# Patient Record
Sex: Male | Born: 1935 | Race: Black or African American | Hispanic: No | Marital: Married | Smoking: Never smoker
Health system: Southern US, Community
[De-identification: ages and names within clinical notes are randomized; demographics above are authoritative.]

## PROBLEM LIST (undated history)

## (undated) DIAGNOSIS — R55 Syncope and collapse: Secondary | ICD-10-CM

## (undated) DIAGNOSIS — E7800 Pure hypercholesterolemia, unspecified: Secondary | ICD-10-CM

## (undated) DIAGNOSIS — N189 Chronic kidney disease, unspecified: Secondary | ICD-10-CM

## (undated) DIAGNOSIS — Z8619 Personal history of other infectious and parasitic diseases: Secondary | ICD-10-CM

## (undated) DIAGNOSIS — I10 Essential (primary) hypertension: Secondary | ICD-10-CM

## (undated) DIAGNOSIS — I2510 Atherosclerotic heart disease of native coronary artery without angina pectoris: Secondary | ICD-10-CM

## (undated) DIAGNOSIS — E785 Hyperlipidemia, unspecified: Secondary | ICD-10-CM

## (undated) DIAGNOSIS — I639 Cerebral infarction, unspecified: Secondary | ICD-10-CM

## (undated) DIAGNOSIS — Z8639 Personal history of other endocrine, nutritional and metabolic disease: Secondary | ICD-10-CM

## (undated) DIAGNOSIS — Z8719 Personal history of other diseases of the digestive system: Secondary | ICD-10-CM

## (undated) DIAGNOSIS — N289 Disorder of kidney and ureter, unspecified: Secondary | ICD-10-CM

## (undated) DIAGNOSIS — R0789 Other chest pain: Secondary | ICD-10-CM

## (undated) DIAGNOSIS — T7840XA Allergy, unspecified, initial encounter: Secondary | ICD-10-CM

## (undated) DIAGNOSIS — E870 Hyperosmolality and hypernatremia: Secondary | ICD-10-CM

## (undated) DIAGNOSIS — K219 Gastro-esophageal reflux disease without esophagitis: Secondary | ICD-10-CM

## (undated) HISTORY — DX: Hyperosmolality and hypernatremia: E87.0

## (undated) HISTORY — DX: Atherosclerotic heart disease of native coronary artery without angina pectoris: I25.10

## (undated) HISTORY — DX: Allergy, unspecified, initial encounter: T78.40XA

## (undated) HISTORY — DX: Cerebral infarction, unspecified: I63.9

## (undated) HISTORY — DX: Disorder of kidney and ureter, unspecified: N28.9

## (undated) HISTORY — DX: Essential (primary) hypertension: I10

## (undated) HISTORY — DX: Hyperlipidemia, unspecified: E78.5

## (undated) HISTORY — DX: Chronic kidney disease, unspecified: N18.9

## (undated) HISTORY — DX: Other chest pain: R07.89

## (undated) HISTORY — DX: Personal history of other infectious and parasitic diseases: Z86.19

## (undated) HISTORY — PX: TONSILLECTOMY: SHX5217

## (undated) HISTORY — DX: Syncope and collapse: R55

## (undated) HISTORY — DX: Pure hypercholesterolemia, unspecified: E78.00

---

## 1995-07-02 HISTORY — PX: CORONARY ARTERY BYPASS GRAFT: SHX141

## 1999-01-25 ENCOUNTER — Inpatient Hospital Stay (HOSPITAL_COMMUNITY): Admission: EM | Admit: 1999-01-25 | Discharge: 1999-01-28 | Payer: Self-pay | Admitting: Emergency Medicine

## 1999-01-25 ENCOUNTER — Encounter: Payer: Self-pay | Admitting: Cardiology

## 1999-01-26 HISTORY — PX: CARDIAC CATHETERIZATION: SHX172

## 1999-01-28 ENCOUNTER — Inpatient Hospital Stay (HOSPITAL_COMMUNITY): Admission: EM | Admit: 1999-01-28 | Discharge: 1999-01-30 | Payer: Self-pay | Admitting: Emergency Medicine

## 1999-01-28 ENCOUNTER — Encounter: Payer: Self-pay | Admitting: Cardiology

## 2002-06-17 ENCOUNTER — Encounter: Admission: RE | Admit: 2002-06-17 | Discharge: 2002-09-15 | Payer: Self-pay | Admitting: Cardiology

## 2006-03-07 ENCOUNTER — Encounter: Payer: Self-pay | Admitting: Cardiology

## 2006-11-14 ENCOUNTER — Emergency Department (HOSPITAL_COMMUNITY): Admission: EM | Admit: 2006-11-14 | Discharge: 2006-11-15 | Payer: Self-pay | Admitting: Emergency Medicine

## 2006-11-20 ENCOUNTER — Inpatient Hospital Stay (HOSPITAL_COMMUNITY): Admission: EM | Admit: 2006-11-20 | Discharge: 2006-11-23 | Payer: Self-pay | Admitting: Emergency Medicine

## 2010-03-09 ENCOUNTER — Ambulatory Visit: Payer: Self-pay | Admitting: Cardiology

## 2010-09-07 ENCOUNTER — Other Ambulatory Visit: Payer: Self-pay | Admitting: Cardiology

## 2010-09-07 ENCOUNTER — Ambulatory Visit (INDEPENDENT_AMBULATORY_CARE_PROVIDER_SITE_OTHER): Payer: Medicare Other | Admitting: Nurse Practitioner

## 2010-09-07 DIAGNOSIS — I2510 Atherosclerotic heart disease of native coronary artery without angina pectoris: Secondary | ICD-10-CM

## 2010-09-07 DIAGNOSIS — I119 Hypertensive heart disease without heart failure: Secondary | ICD-10-CM

## 2010-09-07 DIAGNOSIS — E119 Type 2 diabetes mellitus without complications: Secondary | ICD-10-CM

## 2010-09-07 LAB — COMPREHENSIVE METABOLIC PANEL
ALT: 19 U/L (ref 0–53)
AST: 19 U/L (ref 0–37)
Albumin: 4.7 g/dL (ref 3.5–5.2)
Alkaline Phosphatase: 100 U/L (ref 39–117)
BUN: 31 mg/dL — ABNORMAL HIGH (ref 6–23)
CO2: 20 mEq/L (ref 19–32)
Calcium: 9.2 mg/dL (ref 8.4–10.5)
Chloride: 108 mEq/L (ref 96–112)
Creat: 2.48 mg/dL — ABNORMAL HIGH (ref 0.40–1.50)
Glucose, Bld: 129 mg/dL — ABNORMAL HIGH (ref 70–99)
Potassium: 4 mEq/L (ref 3.5–5.3)
Sodium: 144 mEq/L (ref 135–145)
Total Bilirubin: 0.8 mg/dL (ref 0.3–1.2)
Total Protein: 7.4 g/dL (ref 6.0–8.3)

## 2010-09-07 LAB — LIPID PANEL
Cholesterol: 124 mg/dL (ref 0–200)
HDL: 25 mg/dL — ABNORMAL LOW (ref >39)
LDL Cholesterol: 53 mg/dL (ref 0–99)
Total CHOL/HDL Ratio: 5 Ratio
Triglycerides: 229 mg/dL — ABNORMAL HIGH (ref <150)
VLDL: 46 mg/dL — ABNORMAL HIGH (ref 0–40)

## 2010-09-07 LAB — HEMOGLOBIN A1C
Hgb A1c MFr Bld: 8.7 % — ABNORMAL HIGH (ref <5.7)
Mean Plasma Glucose: 203 mg/dL — ABNORMAL HIGH (ref <117)

## 2010-09-28 ENCOUNTER — Telehealth: Payer: Self-pay | Admitting: Cardiology

## 2010-09-28 NOTE — Telephone Encounter (Signed)
Advised of labs 

## 2010-09-28 NOTE — Telephone Encounter (Signed)
WANTS TO KNOW RESULTS OF LABS FROM LAST WEEK. RETURNING CALL FROM YESTERDAY

## 2010-11-02 ENCOUNTER — Other Ambulatory Visit: Payer: Self-pay | Admitting: *Deleted

## 2010-11-02 DIAGNOSIS — I10 Essential (primary) hypertension: Secondary | ICD-10-CM

## 2010-11-02 DIAGNOSIS — E7800 Pure hypercholesterolemia, unspecified: Secondary | ICD-10-CM

## 2010-11-02 MED ORDER — AMLODIPINE BESYLATE 10 MG PO TABS: 10.0000 mg | ORAL_TABLET | Freq: Every day | ORAL | Status: DC

## 2010-11-02 MED ORDER — ATORVASTATIN CALCIUM 40 MG PO TABS: 40.0000 mg | ORAL_TABLET | Freq: Every day | ORAL | Status: DC

## 2010-11-02 NOTE — Telephone Encounter (Signed)
Faxed refill for amlodipine 10mg 

## 2010-11-02 NOTE — Telephone Encounter (Signed)
Refilled atorvastatin 40 mg by fax to [facility] [address]

## 2010-11-12 ENCOUNTER — Other Ambulatory Visit: Payer: Self-pay | Admitting: *Deleted

## 2010-11-12 MED ORDER — ISOSORBIDE MONONITRATE ER 60 MG PO TB24: 60.0000 mg | ORAL_TABLET | ORAL | Status: DC

## 2010-11-12 NOTE — Telephone Encounter (Signed)
Refilled meds per fax request.  

## 2010-11-13 NOTE — H&P (Signed)
[name], [name] NO.:  [id number]   MEDICAL RECORD NO.:  [id number]          PATIENT TYPE:  INP   LOCATION:  4706                         FACILITY:  [facility]   PHYSICIAN:  Peter M. Swaziland, M.D.  DATE OF BIRTH:  12/24/1927   DATE OF ADMISSION:  11/20/2006  DATE OF DISCHARGE:                              HISTORY & PHYSICAL   HISTORY OF PRESENT ILLNESS:  [name] is a 77 year old black male who  has a history of coronary artery disease.  He is status post coronary  artery bypass surgery in 1997.  He also has history of diabetes mellitus  that has been poorly controlled.  His last A1c on 10/03/2006 was 10.4.  He was subsequently started on metformin.  The patient was well up until  this past Wednesday when he developed acute gastrointestinal illness  associated with nausea, vomiting, diarrhea and feeling that he was  febrile.  He was seen in the emergency department on Nov 14, 2006 and  treated and discharged to home.  He has continued to have persistent  nausea and vomiting.  He has had persistent loose stools with a severe  burning when he passes his bowels.  He has been unable to take p.o.'s  well and has been unable to take his medications due to his nausea and  vomiting.  He was seen at [facility] today and referred on to the  emergency department.  The patient is severely hyperglycemic on arrival,  with a glucose of greater than 700.  In addition, the patient has had a  three to four-day history of constant left anterior chest pain.  He just  describes this as a pain.  It does not radiate.  He has no associated  shortness of breath or diaphoresis.  He has not tried to take any  nitroglycerin.  Currently he states that pain has resolved.   PAST MEDICAL HISTORY:  1. Coronary disease status post CABG in 1997.  His last evaluation      with a stress Cardiolite study in September of 2007 showed poor      exercise tolerance.  He had mild anterior wall ischemia and  normal      ejection fraction of 64%.  2. Diabetes mellitus type 2, poorly controlled.  The patient is on      chronic insulin therapy.  3. Hypertension.  4. Hypercholesterolemia.  5. Status post tonsillectomy.  6. History of chronic renal insufficiency with baseline creatinine of      1.8.   ALLERGIES:  HE HAS NO KNOWN ALLERGIES.   PRIOR MEDICATIONS:  1. Metformin 500 mg per day.  2. Imdur 60 mg per day.  3. Toprol-XL 100 mg per day.  4. Ecotrin 325 mg per day.  5. Nexium 40 mg per day.  6. Lipitor 40 mg per day.  7. Amaryl 4 mg per day.  8. Norvasc 10 mg per day.  9. Lantus 50 units subcutaneously daily.  10.Cozaar 100 mg per day.  11.HCTZ 12.5 mg per day.   SOCIAL HISTORY:  The patient has no history  of smoking or tobacco.  He  is married.  He has five children.  He is retired.   FAMILY HISTORY:  Noncontributory.   REVIEW OF SYSTEMS:  Otherwise as noted in HPI.  Otherwise, negative.   PHYSICAL EXAMINATION:  GENERAL:  An elderly black male in no apparent  distress.  VITAL SIGNS:  Blood pressure 159/94, pulse 87 and regular.  He is  afebrile.  Saturations are 100%.  HEENT:  He is balding.  Normocephalic, atraumatic.  His pupils are  equal, round, and reactive.  Oropharynx is clear.  NECK:  Supple without JVD, adenopathy, thyromegaly, or bruits.  LUNGS:  Clear to auscultation and percussion.  CARDIAC:  Regular rate and rhythm without gallop, murmur, rub, or click.  ABDOMEN:  Soft, nontender.  Bowel sounds are positive.  There are no  masses.  EXTREMITIES:  Without edema.  Pulses were 2+ and symmetric.  NEUROLOGIC:  Intact.   LABORATORY DATA:  Chest x-ray shows no active disease.  ECG shows normal  sinus rhythm with nonspecific T-wave abnormality.  This is unchanged  from Nov 14, 2006.   Urinalysis shows a shows specific gravity of 1.027, pH of 5.5, and has  greater than 1000 glucose.  Microscopic is negative.  Myoglobin is  greater than 500.  CK-MB is 1.3.   Troponin is less than 0.05.  White  count 7800, hemoglobin 14.2, hematocrit 42.6, platelets 215,000.  Sodium  is 137, potassium 4.8, chloride 100, CO2 21, BUN 73, creatinine is 2.7,  glucose is 707.   IMPRESSION:  1. Diabetes mellitus with severe hyperglycemia related to dehydration,      recent gastrointestinal illness, and inability to take his      medications.  2. Severe dehydration  3. Atypical chest pain.  4. Status post remote coronary artery bypass surgery.  5. Hypertension.  6. Hyperlipidemia.  7. Acute-on-chronic renal insufficiency due to dehydration.   PLAN:  The patient will be admitted to telemetry.  Will rule out  myocardial infarction with serial cardiac enzymes.  He will be hydrated  aggressively with IV fluids and placed on Glucommander insulin.  [facility] hospitalists were consulted to help manage his hyperglycemia.  Will resume his cardiac medications except continue to hold his Cozaar,  HCTZ, and metformin.  Further plans per Dr. Patty Sermons.           ______________________________  Peter M. Swaziland, M.D.     PMJ/MEDQ  D:  11/20/2006  T:  11/21/2006  Job:  [id number]   cc:   Elliot Cousin, M.D.  Cassell Clement, M.D.

## 2010-11-13 NOTE — Consult Note (Signed)
NAMEBURECH, MCFARLAND NO.:  [id number]   MEDICAL RECORD NO.:  [id number]          PATIENT TYPE:  EMS   LOCATION:  MAJO                         FACILITY:  [facility]   PHYSICIAN:  Elliot Cousin, M.D.    DATE OF BIRTH:  September 19, 1927   DATE OF CONSULTATION:  11/20/2006  DATE OF DISCHARGE:                                 CONSULTATION   PRIMARY CARE PHYSICIAN:  Cassell Clement, M.D.   REASON FOR CONSULTATION:  Uncontrolled diabetes mellitus.   REFERRING PHYSICIAN:  Peter M. Swaziland, M.D.   HISTORY OF PRESENT ILLNESS:  The patient is a 77 year old man with a  past medical history significant for coronary artery disease, type 2  insulin-requiring diabetes mellitus, and hypertension, who presents to  the emergency department with a chief complaint of chest pain.  The  patient states that his chest pain started approximately one week ago.  It has been intermittent up until today.  Over the past 24 hours, it has  been mostly constant.  The pain is located primarily on the left side.  It is moderate in intensity.  It is dull.  It has been associated with  nausea and vomiting and feeling hot (question diaphoresis).  He has  shortness of breath also.  The pain occurred at rest.  He has also had a  recent bout of diarrhea last week.  He also had abdominal cramping and  subjective fever as well as a poor appetite.  Most of his symptoms have  resolved, however, he continues to have a poor appetite.  He admitted  not taking his diabetes medications for several days.  Last week, he  says that his blood sugars were ranging between 100 and 150.  Over the  past 5-6 days, he has not checked his blood sugars.   During the initial evaluation in the emergency department, per the  emergency department physician, it was found that his venous glucose was  greater than 700.  His EKG reveals T wave inversions in the lateral  leads and possibly ST depression in the lateral leads.  His QT  interval  is prolonged at 528 msec.  His BUN is elevated at 78 and his creatinine  is elevated at 2.6.  Of note, the patient actually presented to  [facility] this afternoon with abdominal pain and chest pain.  As the EMT  reported, his EKG was abnormal with T wave depressions.  He was given 4  baby aspirin and transferred to the Emergency Department at [facility].   PAST MEDICAL HISTORY:  1. Coronary artery disease, status post four-vessel CABG in 1997.  2. Type 2 diabetes mellitus, insulin-requiring. The patient was      diagnosed approximately 10 years ago.  He has no known history of      diabetic retinopathy, nephropathy, or neuropathy.  He denies      numbness and tingling in his feet and hands.  3. Hypertension.  4. Gastroesophageal reflux disease.  5. Hyperlipidemia.  6. Status post tonsillectomy in the past.  7. Recent gastroenteritis and generalized muscle aches.  MEDICATIONS:  1. Metformin 500 mg daily.  2. Lantus insulin 50 units q.h.s.  3. Glimepiride 4 mg, half tablet b.i.d.  4. Aspirin 325 mg daily.  5. Lipitor 40 mg daily.  6. Imdur 60 mg daily.  7. Nexium 40 mg daily.  8. Hydrochlorothiazide 12.5 mg daily.  9. Cozaar 100 mg daily.  10.Metoprolol ER 100 mg daily.  11.Amlodipine 10 mg daily.   ALLERGIES:  NO KNOWN DRUG ALLERGIES.   SOCIAL HISTORY:  The patient is married.  [place]  He has five children.  He is retired from [employer].  He denies tobacco, alcohol, and illicit drug use.  When he is  feeling well, he mows the lawn and is active in his garden.   FAMILY HISTORY:  His father died of a hemorrhage at 52 years of age.  His mother died of old age, she was in her late 65s.   REVIEW OF SYSTEMS:  As above in the history of present illness.   PHYSICAL EXAMINATION:  VITAL SIGNS:  Temperature 97, blood pressure  159/94, pulse 87, respiratory rate 18, oxygen saturation 100% on 2  liters of nasal  cannula oxygen.  GENERAL:  The patient is a pleasant 77 year old overweight African  American man who is currently lying in bed in no acute distress.  HEENT:  Head is normocephalic, nontraumatic.  Pupils are equal, round,  and reactive to light.  Extraocular movements are intact.  Conjunctivae  are clear.  Sclerae are white.  Nasal mucosa is mildly dry.  No sinus  tenderness.  Oropharynx reveals mildly dry mucous membranes.  No  posterior exudate or erythema.  No teeth are present.  NECK:  Supple.  No adenopathy.  No thyromegaly.  No bruit.  No JVD.  LUNGS:  Decreased breath sounds in the bases.  Breathing non-labored.  HEART:  S1 S2 with a soft systolic murmur.  CHEST WALL:  Well healed sternotomy scar, nontender.  ABDOMEN:  Obese, positive bowel sounds.  Soft, nontender, nondistended.  No hepatosplenomegaly.  No masses palpated.  EXTREMITIES:  Pedal pulses barely palpable bilaterally.  No pretibial  edema and no pedal edema.  Well healed left leg harvesting scar,  nontender.  NEUROLOGIC:  The patient is alert and oriented x3.  Cranial nerves II-  XII are intact with the exception of a decrease in hearing acuity.  Strength is grossly 5/5.  Sensation is intact.   ADMISSION LABORATORY:  EKG reveals nonspecific T wave abnormalities and  a prolonged QT interval of 528 milliseconds.  Chest x-ray reveals low  inspiratory lung volumes, otherwise no acute abnormalities.  CK-MB 1.3,  troponin I less than 0.05, myoglobin greater than 500.  Sodium 141,  potassium 4.8, chloride 109, BUN 78, glucose greater than 700,  bicarbonate 25, creatinine 2.6, glucose greater than 700.  WBC 7.8,  hemoglobin 14.2, platelets 215.   ASSESSMENT:  1. Uncontrolled type 2 diabetes mellitus.  The patient admits to      noncompliance and more than likely, this is the reason why his      venous glucose is greater than 700.  The patient is not in diabetic     ketoacidosis as his bicarbonate level is well within  normal limits.  2. Chest pain.  The patient has a history of coronary artery disease,      status post coronary artery bypass graft in the past.  His symptoms      are worrisome for  unstable angina.  He is currently chest pain      free, after being given 4 baby aspirin earlier in the afternoon.      He is treated with multiple cardiac medications including      metoprolol, Cozaar, Imdur, and Lipitor.  3. Resolving viral illness.  The patient probably had a      gastroenteritis.  He is currently afebrile and his white blood cell      count is within normal limits.  4. Acute renal failure.  More than likely the patient's renal failure      is acute in the setting of hyperglycemia.  It is unclear whether or      not the patient has underlying chronic kidney disease.  However,      with a venous glucose of greater than 700, the patient's acute      renal failure is probably prerenal azotemia.   RECOMMENDATIONS:  1. Volume repletion with normal saline with potassium chloride added.  2. We will start potassium chloride orally and in the IV fluids as the      serum potassium is expected to fall as his glycemic control      improves.  3. We will start the Glucomander insulin protocol.  4. Diabetes education and review.  5. We will check a TSH and hemoglobin A1c.  6. We will defer the cardiac issues to cardiology.  7. We will follow the patient's electrolytes and renal function      closely.      Elliot Cousin, M.D.  Electronically Signed     DF/MEDQ  D:  11/20/2006  T:  11/20/2006  Job:  [id number]   cc:   Cassell Clement, M.D.

## 2010-11-16 NOTE — Discharge Summary (Signed)
NAMEJERIMYAH, [name] NO.:  [id number]   MEDICAL RECORD NO.:  [id number]          PATIENT TYPE:  INP   LOCATION:  4706                         FACILITY:  [facility]   PHYSICIAN:  Michaelyn Barter, M.D. DATE OF BIRTH:  [birth year]   DATE OF ADMISSION:  11/20/2006  DATE OF DISCHARGE:  11/23/2006                               DISCHARGE SUMMARY   FINAL DIAGNOSES:  1. Severe hyperglycemia.  2. Dehydration.  3. Chest pain.  4. Renal insufficiency.  5. Hypernatremia.   CONSULTATIONS:  Cardiology with Dr. Cassell Clement.   HISTORY OF PRESENT ILLNESS:  [name] is a 77 year old gentleman.  He  indicated that a few days prior to this admission, he developed an acute  episode of nausea, vomiting and diarrhea, as well as subjective fevers.  He was seen in the ER on Nov 14, 2006, where he was treated and  discharged.  His symptoms persisted.  He indicated that he had had  persistent loose stools as well as burning when he passes his bowel  movements.  His p.o. intake had become poor.  He also indicated that he  had been able to take his medications due to the nausea and the  vomiting.  He was seen at [facility] on the date of admission and  referred to the hospital's ER for evaluation.   PAST MEDICAL HISTORY:  Please see that dictated by Dr. Peter Swaziland.   HOSPITAL COURSE:  PROBLEM #1 - SEVERE HYPERGLYCEMIA:  The patient's CBG  was found to be 700 after he presented to the hospital's ER.  He was  placed on a Glucommander.  His sugars improved over the course of his  hospitalization, although they did not completely normalized by the date  of discharge.  He was restarted on his p.o. medications as well as his  Lantus insulin.  The etiology of the patient's severe hyperglycemia most  likely was secondary to his inability to be compliant with his  medications secondary to the nausea, the vomiting and his overall  general state of feeling ill.  By the date of discharge, he  indicated,  however, that he felt significantly better.   PROBLEM #2 - DEHYDRATION:  This most likely was secondary to the  vomiting and diarrhea that the patient had experienced.  He was placed  on IV fluid hydration.  By the date of discharge, he indicated that he  felt significantly better and his dehydration had resolved.   PROBLEM #3 - CHEST PAIN:  The patient did not complain of any chest pain  during this hospitalization; however, it appears that prior to his  admission, he may have complained about chest pain.  His cardiac enzymes  were ordered; his CK-MB was found be 1.3 and 2.6 and 1.7.  His troponin  I was found to be 0.05, 0.06 and 0.06, respectively; therefore, he ruled  out for a cardiac event.  Cardiology was consulted.  Dr. Cassell Clement followed him initially, and later he was followed by Dr.  Donnie Aho.  The patient did not complain of any chest pain throughout  the  course of his hospitalization.   PROBLEM #4 - RENAL INSUFFICIENCY:  There may have been an acute  component to this.  Dehydration may have contributed.  When the patient  first arrived, his BUN was found to be 78, his creatinine 2.6.  By the  date of discharge, there was some improvement with a BUN of 33 and a  creatinine of 1.84.   PROBLEM #5 - HYPERNATREMIA:  Dehydration may have contributed to this.   By the date of discharge, the patient indicated that he felt  better.  His temperature was 98.9, heart rate 71, respirations 20, blood pressure  135/82.  O2 SAT was 95% on room air.  His CBGs were still slightly  elevated, although significantly down from the time of admission.  The  patient did request to be discharged from the hospital; therefore, I  discharged.   MEDICATIONS AT TIME OF DISCHARGE:  1. Amlodipine 10 mg p.o. daily.  2. Metoprolol ER one tablet daily.  3. Imdur 60 mg daily.  4. Nexium once a day.  5. Lipitor one tablet p.o. daily.  6. Lantus insulin 54 units nightly.  7.  Glimepiride take half a tablet b.i.d.  8. Aspirin 325 mg p.o. daily.  9. Cozaar 50 mg p.o. daily.   SPECIAL DISCHARGE INSTRUCTIONS:  Because of the renal insufficiency, the  patient was told to stop his Metformin, to take one-half of his 100 mg  Cozaar pill, to check his sugars at least twice a day and he was also  told not to take his hydrochlorothiazide until he sees his primary care  doctor.      Michaelyn Barter, M.D.  Electronically Signed     OR/MEDQ  D:  01/08/2007  T:  01/09/2007  Job:  [id number]

## 2011-01-31 ENCOUNTER — Encounter: Payer: Self-pay | Admitting: *Deleted

## 2011-02-01 ENCOUNTER — Encounter: Payer: Self-pay | Admitting: Cardiology

## 2011-02-01 ENCOUNTER — Encounter: Payer: Self-pay | Admitting: *Deleted

## 2011-02-04 ENCOUNTER — Ambulatory Visit (INDEPENDENT_AMBULATORY_CARE_PROVIDER_SITE_OTHER): Payer: Medicare Other | Admitting: *Deleted

## 2011-02-04 ENCOUNTER — Encounter: Payer: Self-pay | Admitting: Cardiology

## 2011-02-04 ENCOUNTER — Ambulatory Visit (INDEPENDENT_AMBULATORY_CARE_PROVIDER_SITE_OTHER): Payer: Medicare Other | Admitting: Cardiology

## 2011-02-04 VITALS — BP 140/80 | HR 67 | Wt 213.0 lb

## 2011-02-04 DIAGNOSIS — I259 Chronic ischemic heart disease, unspecified: Secondary | ICD-10-CM

## 2011-02-04 DIAGNOSIS — I519 Heart disease, unspecified: Secondary | ICD-10-CM

## 2011-02-04 DIAGNOSIS — Z794 Long term (current) use of insulin: Secondary | ICD-10-CM | POA: Insufficient documentation

## 2011-02-04 DIAGNOSIS — I119 Hypertensive heart disease without heart failure: Secondary | ICD-10-CM

## 2011-02-04 DIAGNOSIS — E119 Type 2 diabetes mellitus without complications: Secondary | ICD-10-CM

## 2011-02-04 DIAGNOSIS — E7800 Pure hypercholesterolemia, unspecified: Secondary | ICD-10-CM

## 2011-02-04 LAB — BASIC METABOLIC PANEL
BUN: 30 mg/dL — ABNORMAL HIGH (ref 6–23)
Creatinine, Ser: 2.4 mg/dL — ABNORMAL HIGH (ref 0.4–1.5)
GFR: 33.74 mL/min — ABNORMAL LOW (ref >60.00)
Glucose, Bld: 97 mg/dL (ref 70–99)
Potassium: 4.2 mEq/L (ref 3.5–5.1)

## 2011-02-04 LAB — LIPID PANEL
Cholesterol: 153 mg/dL (ref 0–200)
HDL: 32.2 mg/dL — ABNORMAL LOW (ref >39.00)
Triglycerides: 213 mg/dL — ABNORMAL HIGH (ref 0.0–149.0)
VLDL: 42.6 mg/dL — ABNORMAL HIGH (ref 0.0–40.0)

## 2011-02-04 LAB — HEPATIC FUNCTION PANEL
Albumin: 4.8 g/dL (ref 3.5–5.2)
Bilirubin, Direct: 0 mg/dL (ref 0.0–0.3)
Total Protein: 8.5 g/dL — ABNORMAL HIGH (ref 6.0–8.3)

## 2011-02-04 NOTE — Progress Notes (Signed)
[name] Date of Birth:  1927/12/27 [facility] / [facility] [address]   [address] [place], [place]  [address] [phone]           Fax   [phone]  History of Present Illness: This pleasant 77 year old gentleman has a history of ischemic heart disease as well as diabetes mellitus hypercholesterolemia and essential hypertension.  He has known coronary disease and had coronary artery bypass graft surgery in 1997.  His last nuclear stress test was in 2007.  He had mild reversible ischemia at that time and has been continued on medical therapy.  He denies any recent chest pain or angina.  He does remain relatively sedentary except for yard work.  He is a diabetic with occasional hypoglycemic episodes.  He had an echocardiogram 05/11/08 which showed an ejection fraction of 55-60%, elevated right ventricular systolic pressure of 44 and normal diastolic function mild aortic sclerosis mild mitral regurgitation and mild pulmonary hypertension.  He has not been experiencing any symptoms of congestive heart failure.  His last cardiac catheterization was in 2000 and did not require any intervention at that time  Current Outpatient Prescriptions  Medication Sig Dispense Refill  . amLODipine (NORVASC) 10 MG tablet Take 1 tablet (10 mg total) by mouth daily.  90 tablet  3  . aspirin 81 MG tablet Take 81 mg by mouth daily.        Marland Kitchen atorvastatin (LIPITOR) 40 MG tablet Take 1 tablet (40 mg total) by mouth daily.  90 tablet  3  . glimepiride (AMARYL) 4 MG tablet Take 4 mg by mouth 2 (two) times daily.        . insulin glargine (LANTUS) 100 UNIT/ML injection Inject into the skin. As directed      . isosorbide mononitrate (IMDUR) 60 MG 24 hr tablet Take 1 tablet (60 mg total) by mouth every morning.  30 tablet  11  . losartan-hydrochlorothiazide (HYZAAR) 100-12.5 MG per tablet Take 1 tablet by mouth daily.        . metoprolol (LOPRESSOR) 50 MG tablet Take 50 mg by mouth 2 (two) times daily.         Marland Kitchen omeprazole (PRILOSEC OTC) 20 MG tablet Take 20 mg by mouth daily.          No Known Allergies  There is no problem list on file for this patient.   History  Smoking status  . Never Smoker   Smokeless tobacco  . Never Used    History  Alcohol Use No    No family history on file.  Review of Systems: Constitutional: no fever chills diaphoresis or fatigue or change in weight.  Head and neck: no hearing loss, no epistaxis, no photophobia or visual disturbance. Respiratory: No cough, shortness of breath or wheezing. Cardiovascular: No chest pain peripheral edema, palpitations. Gastrointestinal: No abdominal distention, no abdominal pain, no change in bowel habits hematochezia or melena. Genitourinary: No dysuria, no frequency, no urgency, no nocturia. Musculoskeletal:No arthralgias, no back pain, no gait disturbance or myalgias. Neurological: No dizziness, no headaches, no numbness, no seizures, no syncope, no weakness, no tremors. Hematologic: No lymphadenopathy, no easy bruising. Psychiatric: No confusion, no hallucinations, no sleep disturbance.    Physical Exam: Filed Vitals:   02/04/11 0952  BP: 140/80  Pulse: 67  The general appearance reveals a well-developed well-nourished gentleman in no distress.Pupils equal and reactive.   Extraocular Movements are full.  There is no scleral icterus.  The mouth and pharynx are  normal.  The neck is supple.  The carotids reveal no bruits.  The jugular venous pressure is normal.  The thyroid is not enlarged.  There is no lymphadenopathy.  The chest is clear to percussion and auscultation. There are no rales or rhonchi. Expansion of the chest is symmetrical.  The precordium is quiet.  The first heart sound is normal.  The second heart sound is physiologically split.  There is no murmur gallop rub or click.  There is no abnormal lift or heave.  The abdomen is soft and nontender. Bowel sounds are normal. The liver and spleen are  not enlarged. There Are no abdominal masses. There are no bruits.  The pedal pulses are good.  There is no phlebitis or edema.  There is no cyanosis or clubbing.  Strength is normal and symmetrical in all extremities.  There is no lateralizing weakness.  There are no sensory deficits.  The skin is warm and dry.  There is no rash.  EKG today shows normal sinus rhythm and nonspecific T-wave   Assessment / Plan: Blood work today is pending.  Continue same medication.  Recheck in 4 months for followup office visit and lab work

## 2011-02-04 NOTE — Assessment & Plan Note (Signed)
The patient has not been experiencing any hypoglycemic reactions. 

## 2011-02-05 ENCOUNTER — Encounter: Payer: Self-pay | Admitting: Cardiology

## 2011-02-06 ENCOUNTER — Telehealth: Payer: Self-pay | Admitting: *Deleted

## 2011-02-06 NOTE — Telephone Encounter (Signed)
Message copied by Burnell Blanks on Wed Feb 06, 2011  9:15 AM ------      Message from: Cassell Clement      Created: Tue Feb 05, 2011  1:10 PM       Please report.  Cholesterol is better.  TG and HDL are better.LDL nl. Liver and kidnesys stable.  CSD.

## 2011-02-06 NOTE — Telephone Encounter (Signed)
Advised of lab results 

## 2011-02-22 ENCOUNTER — Other Ambulatory Visit: Payer: Self-pay | Admitting: Cardiology

## 2011-02-22 MED ORDER — METOPROLOL TARTRATE 50 MG PO TABS: 50.0000 mg | ORAL_TABLET | Freq: Two times a day (BID) | ORAL | Status: DC

## 2011-03-13 ENCOUNTER — Other Ambulatory Visit: Payer: Self-pay | Admitting: Cardiology

## 2011-03-13 ENCOUNTER — Other Ambulatory Visit: Payer: Self-pay | Admitting: *Deleted

## 2011-03-13 MED ORDER — INSULIN GLARGINE 100 UNIT/ML ~~LOC~~ SOLN: 48.0000 [IU] | Freq: Every day | SUBCUTANEOUS | Status: DC

## 2011-03-13 MED ORDER — "INSULIN SYRINGE-NEEDLE U-100 31G X 5/16"" 0.5 ML MISC": 1.0000 | Status: DC

## 2011-03-13 NOTE — Telephone Encounter (Signed)
Pt wife calling needing more syringes/needles called in to pharmacy. I asked pt wife to clarify and she was not able to. Please call with any questions.  Pt wife said Dr. Patty Sermons was pt only MD.

## 2011-03-13 NOTE — Telephone Encounter (Signed)
Refilled insulin syringes

## 2011-03-13 NOTE — Telephone Encounter (Signed)
Refilled lantus

## 2011-04-03 ENCOUNTER — Other Ambulatory Visit: Payer: Self-pay | Admitting: *Deleted

## 2011-04-03 MED ORDER — LOSARTAN POTASSIUM-HCTZ 100-12.5 MG PO TABS: 1.0000 | ORAL_TABLET | Freq: Every day | ORAL | Status: DC

## 2011-05-14 ENCOUNTER — Other Ambulatory Visit: Payer: Self-pay | Admitting: Cardiology

## 2011-05-14 MED ORDER — OMEPRAZOLE MAGNESIUM 20 MG PO TBEC: 20.0000 mg | DELAYED_RELEASE_TABLET | Freq: Every day | ORAL | Status: DC

## 2011-06-06 ENCOUNTER — Other Ambulatory Visit: Payer: Self-pay | Admitting: Cardiology

## 2011-06-06 ENCOUNTER — Other Ambulatory Visit: Payer: Medicare Other | Admitting: *Deleted

## 2011-06-06 ENCOUNTER — Encounter: Payer: Self-pay | Admitting: Cardiology

## 2011-06-06 ENCOUNTER — Ambulatory Visit (INDEPENDENT_AMBULATORY_CARE_PROVIDER_SITE_OTHER): Payer: Medicare Other | Admitting: Cardiology

## 2011-06-06 ENCOUNTER — Other Ambulatory Visit (INDEPENDENT_AMBULATORY_CARE_PROVIDER_SITE_OTHER): Payer: Medicare Other | Admitting: *Deleted

## 2011-06-06 VITALS — BP 136/88 | HR 88 | Ht 74.0 in | Wt 217.0 lb

## 2011-06-06 DIAGNOSIS — I119 Hypertensive heart disease without heart failure: Secondary | ICD-10-CM

## 2011-06-06 DIAGNOSIS — E119 Type 2 diabetes mellitus without complications: Secondary | ICD-10-CM

## 2011-06-06 DIAGNOSIS — I259 Chronic ischemic heart disease, unspecified: Secondary | ICD-10-CM

## 2011-06-06 DIAGNOSIS — E7800 Pure hypercholesterolemia, unspecified: Secondary | ICD-10-CM

## 2011-06-06 DIAGNOSIS — I519 Heart disease, unspecified: Secondary | ICD-10-CM

## 2011-06-06 LAB — HEPATIC FUNCTION PANEL
ALT: 24 U/L (ref 0–53)
AST: 25 U/L (ref 0–37)
Alkaline Phosphatase: 94 U/L (ref 39–117)
Total Bilirubin: 0.9 mg/dL (ref 0.3–1.2)

## 2011-06-06 LAB — BASIC METABOLIC PANEL
BUN: 31 mg/dL — ABNORMAL HIGH (ref 6–23)
CO2: 24 mEq/L (ref 19–32)
Calcium: 9.3 mg/dL (ref 8.4–10.5)
Creatinine, Ser: 2.5 mg/dL — ABNORMAL HIGH (ref 0.4–1.5)
GFR: 32.01 mL/min — ABNORMAL LOW (ref >60.00)
Glucose, Bld: 149 mg/dL — ABNORMAL HIGH (ref 70–99)
Sodium: 144 mEq/L (ref 135–145)

## 2011-06-06 LAB — HEMOGLOBIN A1C: Hgb A1c MFr Bld: 7.5 % — ABNORMAL HIGH (ref 4.6–6.5)

## 2011-06-06 LAB — LDL CHOLESTEROL, DIRECT: Direct LDL: 50.1 mg/dL

## 2011-06-06 LAB — LIPID PANEL: HDL: 31.8 mg/dL — ABNORMAL LOW (ref >39.00)

## 2011-06-06 NOTE — Patient Instructions (Signed)
Will obtain labs today and call you with the results  Your physician recommends that you continue on your current medications as directed. Please refer to the Current Medication list given to you today. Your physician wants you to follow-up in: 4 months  You will receive a reminder letter in the mail two months in advance. If you don't receive a letter, please call our office to schedule the follow-up appointment.  

## 2011-06-06 NOTE — Assessment & Plan Note (Signed)
Patient has not been experiencing any symptoms of congestive heart failure.  He denies any headaches or dizziness.  He is trying to avoid dietary salt.

## 2011-06-06 NOTE — Assessment & Plan Note (Addendum)
The patient has had no recurrent chest pain or angina 

## 2011-06-06 NOTE — Assessment & Plan Note (Signed)
The lipids today were drawn and are pending.

## 2011-06-06 NOTE — Progress Notes (Signed)
[name] Date of Birth:  05-28-1928 [facility] / [facility] [address]   [address] [place], [place]  [address] [phone]           Fax   [phone]  History of Present Illness: This pleasant 77 year old gentleman is seen for followup office visit.  He has a history of ischemic heart disease.  He has diabetes mellitus, hypercholesterolemia, and essential hypertension.  He is status post coronary artery bypass graft surgery in 1997.  His last nuclear stress test was in 2007 and at that time he did demonstrate mild reversible ischemia but was continued on medical therapy and has done well.  His echocardiogram in November 2009 showed an ejection fraction of 55-60% and mild pulmonary hypertension with right ventricular systolic pressure of 44 as well as normal diastolic function and mild aortic sclerosis and mild mitral regurgitation.  He had cardiac catheterization in 2000, 3 years after his bypass graft surgery, and did not require any intervention at that time.  Current Outpatient Prescriptions  Medication Sig Dispense Refill  . amLODipine (NORVASC) 10 MG tablet Take 1 tablet (10 mg total) by mouth daily.  90 tablet  3  . aspirin 81 MG tablet Take 81 mg by mouth daily.        Marland Kitchen atorvastatin (LIPITOR) 40 MG tablet Take 1 tablet (40 mg total) by mouth daily.  90 tablet  3  . glimepiride (AMARYL) 4 MG tablet Take 4 mg by mouth 2 (two) times daily.        . insulin glargine (LANTUS) 100 UNIT/ML injection Inject 52 Units into the skin daily. As directed       . Insulin Syringe-Needle U-100 31G X 5/16" 0.5 ML MISC 1 Syringe by Does not apply route as directed.  100 each  2  . isosorbide mononitrate (IMDUR) 60 MG 24 hr tablet Take 1 tablet (60 mg total) by mouth every morning.  30 tablet  11  . losartan-hydrochlorothiazide (HYZAAR) 100-12.5 MG per tablet Take 1 tablet by mouth daily.  30 tablet  7  . metoprolol (LOPRESSOR) 50 MG tablet Take 1 tablet (50 mg total) by mouth 2 (two)  times daily.  180 tablet  2  . omeprazole (PRILOSEC OTC) 20 MG tablet Take 1 tablet (20 mg total) by mouth daily.  90 tablet  3    No Known Allergies  Patient Active Problem List  Diagnoses  . Ischemic heart disease  . Benign hypertensive heart disease without heart failure  . Diabetes mellitus  . Hypercholesterolemia    History  Smoking status  . Never Smoker   Smokeless tobacco  . Never Used    History  Alcohol Use No    No family history on file.  Review of Systems: Constitutional: no fever chills diaphoresis or fatigue or change in weight.  Head and neck: no hearing loss, no epistaxis, no photophobia or visual disturbance. Respiratory: No cough, shortness of breath or wheezing. Cardiovascular: No chest pain peripheral edema, palpitations. Gastrointestinal: No abdominal distention, no abdominal pain, no change in bowel habits hematochezia or melena. Genitourinary: No dysuria, no frequency, no urgency, no nocturia. Musculoskeletal:No arthralgias, no back pain, no gait disturbance or myalgias. Neurological: No dizziness, no headaches, no numbness, no seizures, no syncope, no weakness, no tremors. Hematologic: No lymphadenopathy, no easy bruising. Psychiatric: No confusion, no hallucinations, no sleep disturbance.    Physical Exam: Filed Vitals:   06/06/11 0833  BP: 136/88  Pulse: 88   the general appearance  reveals a well-developed well-nourished elderly gentleman in no distress.The head and neck exam reveals pupils equal and reactive.  Extraocular movements are full.  There is no scleral icterus.  The mouth and pharynx are normal.  The neck is supple.  The carotids reveal no bruits.  The jugular venous pressure is normal.  The  thyroid is not enlarged.  There is no lymphadenopathy.  The chest is clear to percussion and auscultation.  There are no rales or rhonchi.  Expansion of the chest is symmetrical.  The precordium is quiet.  The first heart sound is normal.  The  second heart sound is physiologically split.  There is no murmur gallop rub or click.  There is no abnormal lift or heave.  The abdomen is soft and nontender.  The bowel sounds are normal.  The liver and spleen are not enlarged.  There are no abdominal masses.  There are no abdominal bruits.  Extremities reveal good pedal pulses.  There is no phlebitis or edema.  There is no cyanosis or clubbing.  Strength is normal and symmetrical in all extremities.  There is no lateralizing weakness.  There are no sensory deficits.  The skin is warm and dry.  There is no rash.     Assessment / Plan: Continue on same medication.  Lose weight.  Recheck in 4 months for followup office visit and fasting labs occluding hemoglobin A1c

## 2011-06-06 NOTE — Assessment & Plan Note (Signed)
The patient checks his blood sugar at home every day and his sugars at home have been normal.  He denies any hypoglycemic episodes.  His weight is up 4 pounds since last visit she attributes to the holidays

## 2011-06-11 ENCOUNTER — Telehealth: Payer: Self-pay | Admitting: *Deleted

## 2011-06-11 NOTE — Telephone Encounter (Signed)
Message copied by Burnell Blanks on Tue Jun 11, 2011  3:36 PM ------      Message from: Cassell Clement      Created: Mon Jun 10, 2011 10:34 AM       The hemoglobin A1c is improved at 7.5

## 2011-06-11 NOTE — Telephone Encounter (Signed)
Mailed copy of labs and left message to call if any questions  

## 2011-06-11 NOTE — Telephone Encounter (Signed)
Message copied by Burnell Blanks on Tue Jun 11, 2011  3:35 PM ------      Message from: Cassell Clement      Created: Mon Jun 10, 2011 10:33 AM       Please report.  The labs are stable.  Continue same meds.  Continue careful diet.  The kidney function is stable and the blood sugar is slightly higher at 149.  Watch diet carefully.

## 2011-06-11 NOTE — Telephone Encounter (Signed)
Message copied by Burnell Blanks on Tue Jun 11, 2011  3:36 PM ------      Message from: Cassell Clement      Created: Mon Jun 10, 2011 10:34 AM       The cholesterol and the LDL are satisfactory.

## 2011-07-12 ENCOUNTER — Emergency Department (HOSPITAL_COMMUNITY)
Admission: EM | Admit: 2011-07-12 | Discharge: 2011-07-13 | Disposition: A | Discharge status: Home / Self Care | Payer: Medicare Other | Attending: Emergency Medicine | Admitting: Emergency Medicine

## 2011-07-12 ENCOUNTER — Encounter (HOSPITAL_COMMUNITY): Payer: Self-pay | Admitting: Emergency Medicine

## 2011-07-12 ENCOUNTER — Emergency Department (HOSPITAL_COMMUNITY): Payer: Medicare Other

## 2011-07-12 ENCOUNTER — Telehealth: Payer: Self-pay | Admitting: Cardiology

## 2011-07-12 DIAGNOSIS — Z794 Long term (current) use of insulin: Secondary | ICD-10-CM | POA: Insufficient documentation

## 2011-07-12 DIAGNOSIS — Q618 Other cystic kidney diseases: Secondary | ICD-10-CM | POA: Insufficient documentation

## 2011-07-12 DIAGNOSIS — I10 Essential (primary) hypertension: Secondary | ICD-10-CM | POA: Insufficient documentation

## 2011-07-12 DIAGNOSIS — E119 Type 2 diabetes mellitus without complications: Secondary | ICD-10-CM | POA: Insufficient documentation

## 2011-07-12 DIAGNOSIS — M5137 Other intervertebral disc degeneration, lumbosacral region: Secondary | ICD-10-CM | POA: Insufficient documentation

## 2011-07-12 DIAGNOSIS — R109 Unspecified abdominal pain: Secondary | ICD-10-CM | POA: Insufficient documentation

## 2011-07-12 DIAGNOSIS — M51379 Other intervertebral disc degeneration, lumbosacral region without mention of lumbar back pain or lower extremity pain: Secondary | ICD-10-CM | POA: Insufficient documentation

## 2011-07-12 DIAGNOSIS — R10819 Abdominal tenderness, unspecified site: Secondary | ICD-10-CM | POA: Insufficient documentation

## 2011-07-12 DIAGNOSIS — J9819 Other pulmonary collapse: Secondary | ICD-10-CM | POA: Insufficient documentation

## 2011-07-12 DIAGNOSIS — I25810 Atherosclerosis of coronary artery bypass graft(s) without angina pectoris: Secondary | ICD-10-CM | POA: Insufficient documentation

## 2011-07-12 LAB — URINALYSIS, ROUTINE W REFLEX MICROSCOPIC
Ketones, ur: NEGATIVE mg/dL
Leukocytes, UA: NEGATIVE
Nitrite: NEGATIVE
Protein, ur: 30 mg/dL — AB
Urobilinogen, UA: 1 mg/dL (ref 0.0–1.0)

## 2011-07-12 LAB — DIFFERENTIAL
Basophils Absolute: 0 10*3/uL (ref 0.0–0.1)
Basophils Relative: 0 % (ref 0–1)
Monocytes Absolute: 0.9 10*3/uL (ref 0.1–1.0)
Neutro Abs: 7.2 10*3/uL (ref 1.7–7.7)
Neutrophils Relative %: 72 % (ref 43–77)

## 2011-07-12 LAB — COMPREHENSIVE METABOLIC PANEL
BUN: 41 mg/dL — ABNORMAL HIGH (ref 6–23)
CO2: 20 mEq/L (ref 19–32)
Chloride: 104 mEq/L (ref 96–112)
Creatinine, Ser: 2.44 mg/dL — ABNORMAL HIGH (ref 0.50–1.35)
GFR calc non Af Amer: 23 mL/min — ABNORMAL LOW (ref >90)
Total Bilirubin: 0.7 mg/dL (ref 0.3–1.2)

## 2011-07-12 LAB — CBC
MCHC: 33.6 g/dL (ref 30.0–36.0)
RDW: 13.1 % (ref 11.5–15.5)

## 2011-07-12 LAB — LIPASE, BLOOD: Lipase: 41 U/L (ref 11–59)

## 2011-07-12 MED ORDER — SODIUM CHLORIDE 0.9 % IV SOLN
Freq: Once | INTRAVENOUS | Status: AC
  Administered 2011-07-12: 125 mL/h via INTRAVENOUS

## 2011-07-12 MED ORDER — MORPHINE SULFATE 4 MG/ML IJ SOLN
4.0000 mg | Freq: Once | INTRAMUSCULAR | Status: AC
  Administered 2011-07-12: 4 mg via INTRAVENOUS
  Filled 2011-07-12: qty 1

## 2011-07-12 NOTE — ED Notes (Signed)
CT notified that pt was ready for scan 

## 2011-07-12 NOTE — ED Notes (Signed)
IV team at bedside 

## 2011-07-12 NOTE — Telephone Encounter (Signed)
Pt is having pains in the bottom of his stomach and wants to be seen asap. Per spouse she will be leaving at about 11:15 to get hair done and she will be back around 3pm

## 2011-07-12 NOTE — ED Notes (Signed)
RLQ pain x 3-4 days.  LBM 3 days ago.  Normally goes Q day.  Denies N/V/D

## 2011-07-12 NOTE — Telephone Encounter (Signed)
Per scheduling earlier, wife wanted to schedule appointment for Monday.  Was not able to call back before 11:15 and when I tried to call earlier, no answer.  Spoke with wife and patient.  Patient stated he was in a lot of pain, right sided.  Advised he should go to Urgent Care or Emergency Room.  Patient has been to UC on [address]  Advised he should go there.  Patient and wife verbalized understanding

## 2011-07-12 NOTE — ED Notes (Signed)
IV Team called. Unable to get IV access after 4 unsuccessful attempts.

## 2011-07-12 NOTE — ED Provider Notes (Signed)
History     CSN: [id number]  Arrival date & time 07/12/11  1735   First MD Initiated Contact with Patient 07/12/11 1840      Chief Complaint  Patient presents with  . Abdominal Pain    RLQ x 3-4 days.      (Consider location/radiation/quality/duration/timing/severity/associated sxs/prior treatment) HPI Comments: Patient presented complaining of right-sided abdominal pain for the last 3 days.  Patient states that he had some instant oatmeal a few days ago and since that time he's had gradually worsening pain in his right side.  He denies any nausea or vomiting.  He has been constipated for the last 3 days and he normally has daily bowel movements.  He denies any fevers.  He denies any prior abdominal surgeries.  He's not taking any medication at home for the pain.  He is also noted some associated decreased appetite and decreased by mouth intake due to his decreased appetite.  Patient is a 78 y.o. male presenting with abdominal pain. The history is provided by the patient. No language interpreter was used.  Abdominal Pain The primary symptoms of the illness include abdominal pain. The primary symptoms of the illness do not include fever, fatigue, shortness of breath, nausea, vomiting, diarrhea, hematemesis, hematochezia or dysuria. The current episode started more than 2 days ago. The onset of the illness was gradual. The problem has been gradually worsening.  Additional symptoms associated with the illness include constipation. Symptoms associated with the illness do not include chills, hematuria or back pain.    Past Medical History  Diagnosis Date  . Coronary artery disease     post CABG in 1997  . Diabetes mellitus     type 2  . Hypertension   . Hyperlipidemia   . Hypercholesterolemia   . Atypical chest pain   . Acute on chronic renal insufficiency   . Hypernatremia     Past Surgical History  Procedure Date  . Coronary artery bypass graft 1997    x3 -- patent internal  mammary graft to saphenous vein grafts x3  . Tonsillectomy   . Cardiac catheterization 01/26/1999    patent internal mammary graft to saphenous vein grafts x3 -- singnificant three-vessel coroanry artery disease -- potential sites of ischemia involving the intermediate branch, circumflex branch and the distal LAD through diffuse disease, as well as steal type syndrome -- normal LV function -- W. Ashley Royalty., M.D.      No family history on file.  History  Substance Use Topics  . Smoking status: Never Smoker   . Smokeless tobacco: Never Used  . Alcohol Use: No      Review of Systems  Constitutional: Negative.  Negative for fever, chills and fatigue.  HENT: Negative.   Eyes: Negative.  Negative for discharge and redness.  Respiratory: Negative.  Negative for cough and shortness of breath.   Cardiovascular: Negative.  Negative for chest pain.  Gastrointestinal: Positive for abdominal pain and constipation. Negative for nausea, vomiting, diarrhea, hematochezia and hematemesis.  Genitourinary: Negative.  Negative for dysuria and hematuria.  Musculoskeletal: Negative.  Negative for back pain.  Skin: Negative.  Negative for color change and rash.  Neurological: Negative for syncope and headaches.  Hematological: Negative.  Negative for adenopathy.  Psychiatric/Behavioral: Negative.  Negative for confusion.  All other systems reviewed and are negative.    Allergies  Review of patient's allergies indicates no known allergies.  Home Medications   Current Outpatient Rx  Name Route Sig Dispense Refill  .  AMLODIPINE BESYLATE 10 MG PO TABS Oral Take 1 tablet (10 mg total) by mouth daily. 90 tablet 3  . ASPIRIN 81 MG PO TABS Oral Take 81 mg by mouth daily.      . ATORVASTATIN CALCIUM 40 MG PO TABS Oral Take 1 tablet (40 mg total) by mouth daily. 90 tablet 3  . GLIMEPIRIDE 4 MG PO TABS Oral Take 4 mg by mouth 2 (two) times daily.      . INSULIN GLARGINE 100 UNIT/ML [place] SOLN  Subcutaneous Inject 52 Units into the skin daily. As directed     . ISOSORBIDE MONONITRATE ER 60 MG PO TB24 Oral Take 1 tablet (60 mg total) by mouth every morning. 30 tablet 11  . LOSARTAN POTASSIUM-HCTZ 100-12.5 MG PO TABS Oral Take 1 tablet by mouth daily. 30 tablet 7  . METOPROLOL TARTRATE 50 MG PO TABS Oral Take 1 tablet (50 mg total) by mouth 2 (two) times daily. 180 tablet 2  . OMEPRAZOLE MAGNESIUM 20 MG PO TBEC Oral Take 1 tablet (20 mg total) by mouth daily. 90 tablet 3  . INSULIN SYRINGE-NEEDLE U-100 31G X 5/16" 0.5 ML MISC Does not apply 1 Syringe by Does not apply route as directed. 100 each 2    BP 184/77  Pulse 77  Temp(Src) 98.3 F (36.8 C) (Oral)  Resp 20  SpO2 94%  Physical Exam  Nursing note and vitals reviewed. Constitutional: He is oriented to person, place, and time. He appears well-developed and well-nourished.  Non-toxic appearance. He does not have a sickly appearance.  HENT:  Head: Normocephalic and atraumatic.  Eyes: Conjunctivae, EOM and lids are normal. Pupils are equal, round, and reactive to light.  Neck: Trachea normal, normal range of motion and full passive range of motion without pain. Neck supple.  Cardiovascular: Normal rate, regular rhythm and normal heart sounds.   Pulmonary/Chest: Effort normal and breath sounds normal. No respiratory distress.  Abdominal: Soft. Normal appearance. He exhibits no distension. There is tenderness. There is no rebound and no CVA tenderness.       Right upper quadrant and right lower quadrant mild to moderate tenderness on examination.  No rebound or guarding.  Musculoskeletal: Normal range of motion.  Neurological: He is alert and oriented to person, place, and time. He has normal strength.  Skin: Skin is warm, dry and intact. No rash noted.  Psychiatric: He has a normal mood and affect. His behavior is normal. Judgment and thought content normal.    ED Course  Procedures (including critical care time)  Results  for orders placed during the hospital encounter of 07/12/11  COMPREHENSIVE METABOLIC PANEL      Component Value Range   Sodium 140  135 - 145 (mEq/L)   Potassium 4.1  3.5 - 5.1 (mEq/L)   Chloride 104  96 - 112 (mEq/L)   CO2 20  19 - 32 (mEq/L)   Glucose, Bld 175 (*) 70 - 99 (mg/dL)   BUN 41 (*) 6 - 23 (mg/dL)   Creatinine, Ser 1.91 (*) 0.50 - 1.35 (mg/dL)   Calcium 9.3  8.4 - 47.8 (mg/dL)   Total Protein 7.9  6.0 - 8.3 (g/dL)   Albumin 4.0  3.5 - 5.2 (g/dL)   AST 16  0 - 37 (U/L)   ALT 18  0 - 53 (U/L)   Alkaline Phosphatase 98  39 - 117 (U/L)   Total Bilirubin 0.7  0.3 - 1.2 (mg/dL)   GFR calc non Af Amer 23 (*) >  90 (mL/min)   GFR calc Af Amer 27 (*) >90 (mL/min)  LIPASE, BLOOD      Component Value Range   Lipase 41  11 - 59 (U/L)  URINALYSIS, ROUTINE W REFLEX MICROSCOPIC      Component Value Range   Color, Urine YELLOW  YELLOW    APPearance CLEAR  CLEAR    Specific Gravity, Urine 1.017  1.005 - 1.030    pH 5.0  5.0 - 8.0    Glucose, UA 100 (*) NEGATIVE (mg/dL)   Hgb urine dipstick SMALL (*) NEGATIVE    Bilirubin Urine NEGATIVE  NEGATIVE    Ketones, ur NEGATIVE  NEGATIVE (mg/dL)   Protein, ur 30 (*) NEGATIVE (mg/dL)   Urobilinogen, UA 1.0  0.0 - 1.0 (mg/dL)   Nitrite NEGATIVE  NEGATIVE    Leukocytes, UA NEGATIVE  NEGATIVE   CBC      Component Value Range   WBC 9.9  4.0 - 10.5 (K/uL)   RBC 3.90 (*) 4.22 - 5.81 (MIL/uL)   Hemoglobin 11.9 (*) 13.0 - 17.0 (g/dL)   HCT 16.1 (*) 09.6 - 52.0 (%)   MCV 90.8  78.0 - 100.0 (fL)   MCH 30.5  26.0 - 34.0 (pg)   MCHC 33.6  30.0 - 36.0 (g/dL)   RDW 04.5  40.9 - 81.1 (%)   Platelets 182  150 - 400 (K/uL)  DIFFERENTIAL      Component Value Range   Neutrophils Relative 72  43 - 77 (%)   Neutro Abs 7.2  1.7 - 7.7 (K/uL)   Lymphocytes Relative 18  12 - 46 (%)   Lymphs Abs 1.8  0.7 - 4.0 (K/uL)   Monocytes Relative 9  3 - 12 (%)   Monocytes Absolute 0.9  0.1 - 1.0 (K/uL)   Eosinophils Relative 1  0 - 5 (%)   Eosinophils Absolute  0.1  0.0 - 0.7 (K/uL)   Basophils Relative 0  0 - 1 (%)   Basophils Absolute 0.0  0.0 - 0.1 (K/uL)  URINE MICROSCOPIC-ADD ON      Component Value Range   Squamous Epithelial / LPF RARE  RARE    Dg Abd Acute W/chest  07/12/2011  *RADIOLOGY REPORT*  Clinical Data: Right side abdominal pain, decreased bowel movements  ACUTE ABDOMEN SERIES (ABDOMEN 2 VIEW & CHEST 1 VIEW)  Comparison: Chest radiograph 11/20/1998 a  Findings: Upper normal heart size post CABG. Tortuous aorta. Pulmonary vascularity normal. Bibasilar atelectasis. Upper lungs clear. Nonobstructive bowel gas pattern. No bowel dilatation, bowel wall thickening or free intraperitoneal air. No urinary tract calcification. Gas and stool present in rectum. Bones mildly demineralized.  IMPRESSION: No acute abdominal abnormalities. Bibasilar atelectasis.  Original Report Authenticated By: Lollie Marrow, M.D.       MDM  Patient with right-sided abdominal pain that is somewhat poorly localized.  He doesn't have elevations in his LFTs or lipase to suggest cholecystitis, hepatitis or pancreatitis.  There is some possibility for appendicitis given the patient's decreased appetite and has right-sided abdominal pain.  There no acute findings for obstruction on his acute abdominal series and the abdominal exam is not concerning for perforation.  No significant constipation seen on the x-ray.  Patient is currently pending a CT scan to evaluate for further intra-abdominal pathology at this time.        Nat Christen, MD 07/13/11 [phone]

## 2011-07-13 MED ORDER — TRAMADOL HCL 50 MG PO TABS: 50.0000 mg | ORAL_TABLET | Freq: Four times a day (QID) | ORAL | Status: AC | PRN

## 2011-10-14 ENCOUNTER — Other Ambulatory Visit: Payer: Self-pay | Admitting: *Deleted

## 2011-10-14 MED ORDER — GLIMEPIRIDE 4 MG PO TABS: 4.0000 mg | ORAL_TABLET | Freq: Two times a day (BID) | ORAL | Status: DC

## 2011-10-14 NOTE — Telephone Encounter (Signed)
Refilled glimepiride.

## 2011-10-24 ENCOUNTER — Other Ambulatory Visit (INDEPENDENT_AMBULATORY_CARE_PROVIDER_SITE_OTHER): Payer: Medicare Other

## 2011-10-24 ENCOUNTER — Encounter: Payer: Self-pay | Admitting: Cardiology

## 2011-10-24 ENCOUNTER — Ambulatory Visit (INDEPENDENT_AMBULATORY_CARE_PROVIDER_SITE_OTHER): Payer: Medicare Other | Admitting: Cardiology

## 2011-10-24 VITALS — BP 130/88 | HR 80 | Ht 73.0 in | Wt 211.0 lb

## 2011-10-24 DIAGNOSIS — I119 Hypertensive heart disease without heart failure: Secondary | ICD-10-CM

## 2011-10-24 DIAGNOSIS — I2510 Atherosclerotic heart disease of native coronary artery without angina pectoris: Secondary | ICD-10-CM

## 2011-10-24 DIAGNOSIS — I259 Chronic ischemic heart disease, unspecified: Secondary | ICD-10-CM

## 2011-10-24 DIAGNOSIS — E7800 Pure hypercholesterolemia, unspecified: Secondary | ICD-10-CM

## 2011-10-24 DIAGNOSIS — E119 Type 2 diabetes mellitus without complications: Secondary | ICD-10-CM

## 2011-10-24 LAB — LIPID PANEL
Cholesterol: 134 mg/dL (ref 0–200)
Total CHOL/HDL Ratio: 4
Triglycerides: 172 mg/dL — ABNORMAL HIGH (ref 0.0–149.0)

## 2011-10-24 LAB — HEPATIC FUNCTION PANEL
AST: 23 U/L (ref 0–37)
Albumin: 4 g/dL (ref 3.5–5.2)
Alkaline Phosphatase: 96 U/L (ref 39–117)

## 2011-10-24 LAB — BASIC METABOLIC PANEL
BUN: 24 mg/dL — ABNORMAL HIGH (ref 6–23)
CO2: 22 mEq/L (ref 19–32)
Calcium: 9.1 mg/dL (ref 8.4–10.5)
Creatinine, Ser: 2.3 mg/dL — ABNORMAL HIGH (ref 0.4–1.5)

## 2011-10-24 MED ORDER — LOSARTAN POTASSIUM 50 MG PO TABS: 50.0000 mg | ORAL_TABLET | Freq: Every day | ORAL | Status: DC

## 2011-10-24 NOTE — Assessment & Plan Note (Signed)
No hypoglycemic episodes.  Blood sugars recorded at home have been satisfactory

## 2011-10-24 NOTE — Assessment & Plan Note (Signed)
The patient has had no recurrent chest pain or angina 

## 2011-10-24 NOTE — Patient Instructions (Signed)
WILL STOP LOSARTAN HCT AND START PLAIN LOSARTAN 50 MG DAILY  Your physician recommends that you schedule a follow-up appointment in: 4 months with fasting labs (lp/bmet/hfp/a1c/ekg)

## 2011-10-24 NOTE — Progress Notes (Signed)
[name] Date of Birth:  14-Feb-1928 [facility] [address] [place], [place]  [address] [phone]         Fax   [phone]  History of Present Illness: This pleasant 78 year old African American gentleman is seen for a four-month followup office visit.  He has a past history of ischemic heart disease and a history of diabetes and hypertension and hypercholesterolemia.  Since last visit has been doing well.  He had coronary artery bypass graft surgery in 1997.  Last nuclear stress test was in 2007 and at that time he did  demonstrate mild reversible ischemia but has continued to do well on medical therapy.  An echocardiogram in November 2009 showed an ejection fraction of 55-60% he had cardiac catheterization in 2000 which was 3 years after his bypass graft surgery and he did not require any intervention at that time.  Current Outpatient Prescriptions  Medication Sig Dispense Refill  . amLODipine (NORVASC) 10 MG tablet Take 1 tablet (10 mg total) by mouth daily.  90 tablet  3  . aspirin 81 MG tablet Take 81 mg by mouth daily.        Marland Kitchen atorvastatin (LIPITOR) 40 MG tablet Take 1 tablet (40 mg total) by mouth daily.  90 tablet  3  . glimepiride (AMARYL) 4 MG tablet Take 1 tablet (4 mg total) by mouth 2 (two) times daily.  180 tablet  3  . insulin glargine (LANTUS) 100 UNIT/ML injection Inject 52 Units into the skin daily. As directed       . Insulin Syringe-Needle U-100 31G X 5/16" 0.5 ML MISC 1 Syringe by Does not apply route as directed.  100 each  2  . isosorbide mononitrate (IMDUR) 60 MG 24 hr tablet Take 1 tablet (60 mg total) by mouth every morning.  30 tablet  11  . metoprolol (LOPRESSOR) 50 MG tablet Take 1 tablet (50 mg total) by mouth 2 (two) times daily.  180 tablet  2  . omeprazole (PRILOSEC OTC) 20 MG tablet Take 1 tablet (20 mg total) by mouth daily.  90 tablet  3  . losartan (COZAAR) 50 MG tablet Take 1 tablet (50 mg total) by mouth daily.  90 tablet  3     No Known Allergies  Patient Active Problem List  Diagnoses  . Ischemic heart disease  . Benign hypertensive heart disease without heart failure  . Diabetes mellitus  . Hypercholesterolemia    History  Smoking status  . Never Smoker   Smokeless tobacco  . Never Used    History  Alcohol Use No    No family history on file.  Review of Systems: Constitutional: no fever chills diaphoresis or fatigue or change in weight.  Head and neck: no hearing loss, no epistaxis, no photophobia or visual disturbance. Respiratory: No cough, shortness of breath or wheezing. Cardiovascular: No chest pain peripheral edema, palpitations. Gastrointestinal: No abdominal distention, no abdominal pain, no change in bowel habits hematochezia or melena. Genitourinary: No dysuria, no frequency, no urgency, no nocturia. Musculoskeletal:No arthralgias, no back pain, no gait disturbance or myalgias. Neurological: No dizziness, no headaches, no numbness, no seizures, no syncope, no weakness, no tremors. Hematologic: No lymphadenopathy, no easy bruising. Psychiatric: No confusion, no hallucinations, no sleep disturbance.    Physical Exam: Filed Vitals:   10/24/11 1046  BP: 130/88  Pulse: 80   the general appearance reveals a well-developed well-nourished gentleman in no distress.  His weight is down 6 pounds since  his last visit.The head and neck exam reveals pupils equal and reactive.  Extraocular movements are full.  There is no scleral icterus.  The mouth and pharynx are normal.  The neck is supple.  The carotids reveal no bruits.  The jugular venous pressure is normal.  The  thyroid is not enlarged.  There is no lymphadenopathy.  The chest is clear to percussion and auscultation.  There are no rales or rhonchi.  Expansion of the chest is symmetrical.  The precordium is quiet.  The first heart sound is normal.  The second heart sound is physiologically split.  There is no murmur gallop rub or click.   There is no abnormal lift or heave.  The abdomen is soft and nontender.  The bowel sounds are normal.  The liver and spleen are not enlarged.  There are no abdominal masses.  There are no abdominal bruits.  Extremities reveal good pedal pulses.  There is no phlebitis or edema.  There is no cyanosis or clubbing.  Strength is normal and symmetrical in all extremities.  There is no lateralizing weakness.  There are no sensory deficits.  The skin is warm and dry.  There is no rash.     Assessment / Plan: Continue same medication.  His blood pressures have been borderline here and we are going to restart losartan 50 mg daily without the hydrochlorothiazide.  Recheck in 4 months for followup office visit EKG lipid panel hepatic function panel nasal metabolic panel and hemoglobin Y7W

## 2011-10-24 NOTE — Assessment & Plan Note (Signed)
The patient brought in a list of his blood pressures and of his blood sugars.  His blood pressure has been satisfactory and he has been off his losartan HCT for several months.

## 2011-10-24 NOTE — Progress Notes (Signed)
Quick Note:  Please report to patient. The recent labs are stable. Continue same medication and careful diet. ______ 

## 2011-10-25 ENCOUNTER — Telehealth: Payer: Self-pay | Admitting: *Deleted

## 2011-10-25 NOTE — Telephone Encounter (Signed)
Message copied by Burnell Blanks on Fri Oct 25, 2011 10:29 AM ------      Message from: Cassell Clement      Created: Thu Oct 24, 2011  9:10 PM       Please report to patient.  The recent labs are stable. Continue same medication and careful diet.

## 2011-10-25 NOTE — Telephone Encounter (Signed)
Advised of results

## 2011-11-13 ENCOUNTER — Other Ambulatory Visit: Payer: Self-pay | Admitting: Cardiology

## 2011-11-13 NOTE — Telephone Encounter (Signed)
Refilled atorvastatin,amlodipine and isosorbide

## 2011-12-05 ENCOUNTER — Encounter (HOSPITAL_COMMUNITY): Payer: Self-pay | Admitting: *Deleted

## 2011-12-05 ENCOUNTER — Inpatient Hospital Stay (HOSPITAL_COMMUNITY)
Admission: EM | Admit: 2011-12-05 | Discharge: 2011-12-07 | DRG: 312 | Disposition: A | Discharge status: Home / Self Care | Payer: Medicare Other | Attending: Internal Medicine | Admitting: Internal Medicine

## 2011-12-05 DIAGNOSIS — E7800 Pure hypercholesterolemia, unspecified: Secondary | ICD-10-CM | POA: Diagnosis present

## 2011-12-05 DIAGNOSIS — I2510 Atherosclerotic heart disease of native coronary artery without angina pectoris: Secondary | ICD-10-CM | POA: Diagnosis present

## 2011-12-05 DIAGNOSIS — E119 Type 2 diabetes mellitus without complications: Secondary | ICD-10-CM | POA: Diagnosis present

## 2011-12-05 DIAGNOSIS — E870 Hyperosmolality and hypernatremia: Secondary | ICD-10-CM | POA: Diagnosis present

## 2011-12-05 DIAGNOSIS — R55 Syncope and collapse: Secondary | ICD-10-CM

## 2011-12-05 DIAGNOSIS — I129 Hypertensive chronic kidney disease with stage 1 through stage 4 chronic kidney disease, or unspecified chronic kidney disease: Secondary | ICD-10-CM | POA: Diagnosis present

## 2011-12-05 DIAGNOSIS — R350 Frequency of micturition: Secondary | ICD-10-CM | POA: Diagnosis present

## 2011-12-05 DIAGNOSIS — Z794 Long term (current) use of insulin: Secondary | ICD-10-CM | POA: Diagnosis present

## 2011-12-05 DIAGNOSIS — I119 Hypertensive heart disease without heart failure: Secondary | ICD-10-CM | POA: Diagnosis present

## 2011-12-05 DIAGNOSIS — D649 Anemia, unspecified: Secondary | ICD-10-CM | POA: Diagnosis present

## 2011-12-05 DIAGNOSIS — R079 Chest pain, unspecified: Secondary | ICD-10-CM | POA: Diagnosis present

## 2011-12-05 DIAGNOSIS — R739 Hyperglycemia, unspecified: Secondary | ICD-10-CM | POA: Diagnosis present

## 2011-12-05 DIAGNOSIS — N189 Chronic kidney disease, unspecified: Secondary | ICD-10-CM | POA: Diagnosis present

## 2011-12-05 DIAGNOSIS — E785 Hyperlipidemia, unspecified: Secondary | ICD-10-CM | POA: Diagnosis present

## 2011-12-05 DIAGNOSIS — Z951 Presence of aortocoronary bypass graft: Secondary | ICD-10-CM

## 2011-12-05 DIAGNOSIS — R7989 Other specified abnormal findings of blood chemistry: Secondary | ICD-10-CM

## 2011-12-05 DIAGNOSIS — I259 Chronic ischemic heart disease, unspecified: Secondary | ICD-10-CM | POA: Diagnosis present

## 2011-12-05 DIAGNOSIS — R4182 Altered mental status, unspecified: Secondary | ICD-10-CM | POA: Diagnosis present

## 2011-12-05 DIAGNOSIS — E860 Dehydration: Secondary | ICD-10-CM | POA: Diagnosis present

## 2011-12-05 DIAGNOSIS — Z8639 Personal history of other endocrine, nutritional and metabolic disease: Secondary | ICD-10-CM | POA: Diagnosis not present

## 2011-12-05 DIAGNOSIS — I6350 Cerebral infarction due to unspecified occlusion or stenosis of unspecified cerebral artery: Secondary | ICD-10-CM | POA: Diagnosis present

## 2011-12-05 DIAGNOSIS — E1165 Type 2 diabetes mellitus with hyperglycemia: Secondary | ICD-10-CM

## 2011-12-05 DIAGNOSIS — I2589 Other forms of chronic ischemic heart disease: Secondary | ICD-10-CM | POA: Diagnosis present

## 2011-12-05 DIAGNOSIS — R110 Nausea: Secondary | ICD-10-CM | POA: Diagnosis present

## 2011-12-05 HISTORY — DX: Chronic kidney disease, unspecified: N18.9

## 2011-12-05 HISTORY — DX: Personal history of other endocrine, nutritional and metabolic disease: Z86.39

## 2011-12-05 LAB — URINALYSIS, ROUTINE W REFLEX MICROSCOPIC
Bilirubin Urine: NEGATIVE
Hgb urine dipstick: NEGATIVE
Nitrite: NEGATIVE
Protein, ur: 30 mg/dL — AB
Specific Gravity, Urine: 1.018 (ref 1.005–1.030)
Urobilinogen, UA: 1 mg/dL (ref 0.0–1.0)

## 2011-12-05 LAB — DIFFERENTIAL
Basophils Relative: 0 % (ref 0–1)
Eosinophils Absolute: 0.2 10*3/uL (ref 0.0–0.7)
Eosinophils Relative: 3 % (ref 0–5)
Monocytes Absolute: 0.3 10*3/uL (ref 0.1–1.0)
Monocytes Relative: 5 % (ref 3–12)
Neutrophils Relative %: 68 % (ref 43–77)

## 2011-12-05 LAB — CBC
HCT: 33.9 % — ABNORMAL LOW (ref 39.0–52.0)
HCT: 35.4 % — ABNORMAL LOW (ref 39.0–52.0)
Hemoglobin: 11.6 g/dL — ABNORMAL LOW (ref 13.0–17.0)
MCH: 30.7 pg (ref 26.0–34.0)
MCHC: 34.2 g/dL (ref 30.0–36.0)
MCHC: 33.6 g/dL (ref 30.0–36.0)
MCV: 89.7 fL (ref 78.0–100.0)
MCV: 89.6 fL (ref 78.0–100.0)
RDW: 13 % (ref 11.5–15.5)

## 2011-12-05 LAB — CARDIAC PANEL(CRET KIN+CKTOT+MB+TROPI)
CK, MB: 2.1 ng/mL (ref 0.3–4.0)
Relative Index: 1.3 (ref 0.0–2.5)
Troponin I: <0.3 ng/mL (ref <0.30)

## 2011-12-05 LAB — BASIC METABOLIC PANEL
Calcium: 9.5 mg/dL (ref 8.4–10.5)
GFR calc Af Amer: 30 mL/min — ABNORMAL LOW (ref >90)
GFR calc non Af Amer: 26 mL/min — ABNORMAL LOW (ref >90)
Glucose, Bld: 238 mg/dL — ABNORMAL HIGH (ref 70–99)
Potassium: 4.6 mEq/L (ref 3.5–5.1)
Sodium: 141 mEq/L (ref 135–145)

## 2011-12-05 LAB — GLUCOSE, CAPILLARY: Glucose-Capillary: 213 mg/dL — ABNORMAL HIGH (ref 70–99)

## 2011-12-05 LAB — URINE MICROSCOPIC-ADD ON

## 2011-12-05 LAB — CREATININE, SERUM: GFR calc Af Amer: 30 mL/min — ABNORMAL LOW (ref >90)

## 2011-12-05 MED ORDER — INSULIN ASPART 100 UNIT/ML ~~LOC~~ SOLN
0.0000 [IU] | Freq: Three times a day (TID) | SUBCUTANEOUS | Status: DC
  Administered 2011-12-06: 5 [IU] via SUBCUTANEOUS
  Administered 2011-12-06 – 2011-12-07 (×4): 3 [IU] via SUBCUTANEOUS

## 2011-12-05 MED ORDER — METOPROLOL TARTRATE 50 MG PO TABS
50.0000 mg | ORAL_TABLET | Freq: Two times a day (BID) | ORAL | Status: DC
  Administered 2011-12-05 – 2011-12-07 (×4): 50 mg via ORAL
  Filled 2011-12-05 (×5): qty 1

## 2011-12-05 MED ORDER — AMLODIPINE BESYLATE 10 MG PO TABS
10.0000 mg | ORAL_TABLET | Freq: Every day | ORAL | Status: DC
  Administered 2011-12-05 – 2011-12-07 (×3): 10 mg via ORAL
  Filled 2011-12-05 (×3): qty 1

## 2011-12-05 MED ORDER — ASPIRIN 81 MG PO CHEW
81.0000 mg | CHEWABLE_TABLET | Freq: Every day | ORAL | Status: DC
  Administered 2011-12-06: 81 mg via ORAL
  Filled 2011-12-05: qty 1

## 2011-12-05 MED ORDER — LOSARTAN POTASSIUM 50 MG PO TABS
50.0000 mg | ORAL_TABLET | Freq: Every day | ORAL | Status: DC
  Administered 2011-12-05 – 2011-12-07 (×3): 50 mg via ORAL
  Filled 2011-12-05 (×3): qty 1

## 2011-12-05 MED ORDER — PNEUMOCOCCAL VAC POLYVALENT 25 MCG/0.5ML IJ INJ
0.5000 mL | INJECTION | INTRAMUSCULAR | Status: AC
  Administered 2011-12-06: 0.5 mL via INTRAMUSCULAR
  Filled 2011-12-05: qty 0.5

## 2011-12-05 MED ORDER — ISOSORBIDE MONONITRATE ER 60 MG PO TB24
60.0000 mg | ORAL_TABLET | Freq: Every day | ORAL | Status: DC
  Administered 2011-12-05 – 2011-12-07 (×3): 60 mg via ORAL
  Filled 2011-12-05 (×3): qty 1

## 2011-12-05 MED ORDER — PANTOPRAZOLE SODIUM 40 MG PO TBEC
40.0000 mg | DELAYED_RELEASE_TABLET | Freq: Every day | ORAL | Status: DC
  Administered 2011-12-05 – 2011-12-07 (×3): 40 mg via ORAL
  Filled 2011-12-05 (×3): qty 1

## 2011-12-05 MED ORDER — ATORVASTATIN CALCIUM 40 MG PO TABS
40.0000 mg | ORAL_TABLET | Freq: Every day | ORAL | Status: DC
  Administered 2011-12-05 – 2011-12-07 (×3): 40 mg via ORAL
  Filled 2011-12-05 (×3): qty 1

## 2011-12-05 MED ORDER — SENNOSIDES-DOCUSATE SODIUM 8.6-50 MG PO TABS: 2.0000 | ORAL_TABLET | Freq: Two times a day (BID) | ORAL | Status: DC | PRN

## 2011-12-05 MED ORDER — ASPIRIN 81 MG PO TABS: 81.0000 mg | ORAL_TABLET | Freq: Every day | ORAL | Status: DC

## 2011-12-05 MED ORDER — SODIUM CHLORIDE 0.9 % IV SOLN
INTRAVENOUS | Status: DC
  Administered 2011-12-05: 21:00:00 via INTRAVENOUS

## 2011-12-05 MED ORDER — OMEPRAZOLE MAGNESIUM 20 MG PO TBEC: 20.0000 mg | DELAYED_RELEASE_TABLET | Freq: Every day | ORAL | Status: DC

## 2011-12-05 MED ORDER — INSULIN GLARGINE 100 UNIT/ML ~~LOC~~ SOLN
45.0000 [IU] | Freq: Every day | SUBCUTANEOUS | Status: DC
  Administered 2011-12-05 – 2011-12-07 (×3): 45 [IU] via SUBCUTANEOUS

## 2011-12-05 MED ORDER — ACETAMINOPHEN 325 MG PO TABS: 650.0000 mg | ORAL_TABLET | ORAL | Status: DC | PRN

## 2011-12-05 MED ORDER — HEPARIN SODIUM (PORCINE) 5000 UNIT/ML IJ SOLN
5000.0000 [IU] | Freq: Three times a day (TID) | INTRAMUSCULAR | Status: DC
  Administered 2011-12-05 – 2011-12-07 (×6): 5000 [IU] via SUBCUTANEOUS
  Filled 2011-12-05 (×8): qty 1

## 2011-12-05 NOTE — ED Provider Notes (Signed)
Medical screening examination/treatment/procedure(s) were conducted as a shared visit with non-physician practitioner(s) and myself.  I personally evaluated the patient during the encounter  Patient with presyncopal or syncopal event while at the barber shop today.  Given patient's significant cardiac disease there is concern for possible underlying dysrhythmia that may have occurred.  Patient's EKG is unchanged at this time.  We will obtain laboratory studies and continue to monitor the patient on telemetry.  Patient has not had a recent echocardiogram.  I anticipate admitting this patient for observation overnight given his underlying medical problems and the syncopal event that occurred earlier.  Nat Christen, MD 12/05/11 [phone]

## 2011-12-05 NOTE — ED Notes (Signed)
Wife at bedside.

## 2011-12-05 NOTE — H&P (Signed)
History and Physical Examination  Date: 12/05/2011  Patient name: [name] Medical record number: [id number] Date of birth: 06/09/1928 Age: 78 y.o. Gender: male PCP: No primary provider on file.  Chief Complaint:  Chief Complaint  Patient presents with  . Nausea     History of Present Illness: Richerd Grime is an 78 y.o. male with coronary artery disease, status post CABG, diabetes mellitus, insulin requiring, hyperlipidemia and chronic renal insufficiency was sitting in the barber's chair when he developed an episode where he felt "sick" and stop speaking.  He began to breathe heavily for a couple of minutes and was not able to talk.  The patient says he developed no change in vision.  The patient says that he did not fall or pass out.  His wife witnessed the event and she reports that he was fairly unresponsive.  There were not able to converse with him.  This lasted for approximately 2 minutes.  The patient says that he had been working in his chart earlier this morning.  He had been working in his garden.  The patient said he felt fine this morning.  EMS was called.  The patient did not experience chest pain or shortness of breath.  He does report that he has occasional chest pain but not associated with this particular event.  He denies diarrhea.  He reports that he's had frequent urination.  He reports that he has also had some episodes of hypoglycemia.  His blood sugar apparently was not low during this episode but it has not been clearly determined.  His blood sugar in the emergency department was 238.  The patient did not receive glucose from the emergency rescue services.  He does take insulin and also glimepiride.  He has had low blood sugars as low as 29 in the recent past. The patient had an EKG done that revealed no acute changes.  A hospitalization was requested for further monitoring.2  Past Medical History Past Medical History  Diagnosis Date  . Coronary artery disease     post CABG  in 1997  . Diabetes mellitus     type 2  . Hypertension   . Hyperlipidemia   . Hypercholesterolemia   . Atypical chest pain   . Acute on chronic renal insufficiency   . Hypernatremia   . History of hypoglycemia   . CKD (chronic kidney disease)     Past Surgical History Past Surgical History  Procedure Date  . Coronary artery bypass graft 1997    x3 -- patent internal mammary graft to saphenous vein grafts x3  . Tonsillectomy   . Cardiac catheterization 01/26/1999    patent internal mammary graft to saphenous vein grafts x3 -- singnificant three-vessel coroanry artery disease -- potential sites of ischemia involving the intermediate branch, circumflex branch and the distal LAD through diffuse disease, as well as steal type syndrome -- normal LV function -- W. Ashley Royalty., M.D.      Home Meds: Prior to Admission medications   Medication Sig Start Date End Date Taking? Authorizing Provider  amLODipine (NORVASC) 10 MG tablet Take 10 mg by mouth daily.   Yes Historical Provider, MD  aspirin 81 MG tablet Take 81 mg by mouth daily.     Yes Historical Provider, MD  atorvastatin (LIPITOR) 40 MG tablet Take 40 mg by mouth daily.   Yes Historical Provider, MD  glimepiride (AMARYL) 4 MG tablet Take 4 mg by mouth 2 (two) times daily. 10/14/11  Yes Maisie Fus  Brackbill, MD  insulin glargine (LANTUS) 100 UNIT/ML injection Inject 52 Units into the skin daily. As directed 03/13/11  Yes Cassell Clement, MD  isosorbide mononitrate (IMDUR) 60 MG 24 hr tablet Take 60 mg by mouth daily.   Yes Historical Provider, MD  losartan (COZAAR) 50 MG tablet Take 50 mg by mouth daily. 10/24/11 10/23/12 Yes Cassell Clement, MD  metoprolol (LOPRESSOR) 50 MG tablet Take 50 mg by mouth 2 (two) times daily. 02/22/11  Yes Cassell Clement, MD  omeprazole (PRILOSEC OTC) 20 MG tablet Take 20 mg by mouth daily. 05/14/11  Yes Cassell Clement, MD    Allergies: Review of patient's allergies indicates no known  allergies.  Social History:  History   Social History  . Marital Status: Married    Spouse Name: N/A    Number of Children: N/A  . Years of Education: N/A   Occupational History  . Not on file.   Social History Main Topics  . Smoking status: Never Smoker   . Smokeless tobacco: Never Used  . Alcohol Use: No  . Drug Use: No  . Sexually Active:    Other Topics Concern  . Not on file   Social History Narrative  . No narrative on file   Family History: History reviewed. No pertinent family history.  Review of Systems: Pertinent items are noted in HPI. All other systems reviewed and reported as negative.   Physical Exam: Blood pressure 115/56, pulse 63, temperature 97.4 F (36.3 C), temperature source Oral, resp. rate 20, SpO2 98.00%. General appearance: alert, cooperative, appears stated age and no distress Head: Normocephalic, without obvious abnormality, atraumatic Eyes: conjunctivae/corneas clear. PERRL, EOM's intact. Fundi benign. Nose: Nares normal. Septum midline. Mucosa normal. No drainage or sinus tenderness., no discharge, no sinus tenderness Throat: Dry mucous membranes noted Neck: no adenopathy, no carotid bruit, no JVD, supple, symmetrical, trachea midline and thyroid not enlarged, symmetric, no tenderness/mass/nodules Back: symmetric, no curvature. ROM normal. No CVA tenderness. Lungs: clear to auscultation bilaterally and normal percussion bilaterally Chest wall: no tenderness Heart: regular rate and rhythm, S1, S2 normal, no murmur, click, rub or gallop Abdomen: soft, non-tender; bowel sounds normal; no masses,  no organomegaly Extremities: extremities normal, atraumatic, no cyanosis or edema Pulses: 2+ and symmetric Skin: Skin color, texture, turgor normal. No rashes or lesions Neurologic: Alert and oriented X 3, normal strength and tone. Normal symmetric reflexes. Normal coordination and gait  Lab  And Imaging results:  Results for orders placed during  the hospital encounter of 12/05/11 (from the past 24 hour(s))  GLUCOSE, CAPILLARY     Status: Abnormal   Collection Time   12/05/11  2:52 PM      Component Value Range   Glucose-Capillary 213 (*) 70 - 99 (mg/dL)   Comment 1 Notify RN    CBC     Status: Abnormal   Collection Time   12/05/11  3:14 PM      Component Value Range   WBC 6.7  4.0 - 10.5 (K/uL)   RBC 3.78 (*) 4.22 - 5.81 (MIL/uL)   Hemoglobin 11.6 (*) 13.0 - 17.0 (g/dL)   HCT 45.4 (*) 09.8 - 52.0 (%)   MCV 89.7  78.0 - 100.0 (fL)   MCH 30.7  26.0 - 34.0 (pg)   MCHC 34.2  30.0 - 36.0 (g/dL)   RDW 11.9  14.7 - 82.9 (%)   Platelets 175  150 - 400 (K/uL)  DIFFERENTIAL     Status: Normal   Collection Time  12/05/11  3:14 PM      Component Value Range   Neutrophils Relative 68  43 - 77 (%)   Neutro Abs 4.6  1.7 - 7.7 (K/uL)   Lymphocytes Relative 24  12 - 46 (%)   Lymphs Abs 1.6  0.7 - 4.0 (K/uL)   Monocytes Relative 5  3 - 12 (%)   Monocytes Absolute 0.3  0.1 - 1.0 (K/uL)   Eosinophils Relative 3  0 - 5 (%)   Eosinophils Absolute 0.2  0.0 - 0.7 (K/uL)   Basophils Relative 0  0 - 1 (%)   Basophils Absolute 0.0  0.0 - 0.1 (K/uL)  BASIC METABOLIC PANEL     Status: Abnormal   Collection Time   12/05/11  3:14 PM      Component Value Range   Sodium 141  135 - 145 (mEq/L)   Potassium 4.6  3.5 - 5.1 (mEq/L)   Chloride 106  96 - 112 (mEq/L)   CO2 22  19 - 32 (mEq/L)   Glucose, Bld 238 (*) 70 - 99 (mg/dL)   BUN 30 (*) 6 - 23 (mg/dL)   Creatinine, Ser 4.78 (*) 0.50 - 1.35 (mg/dL)   Calcium 9.5  8.4 - 29.5 (mg/dL)   GFR calc non Af Amer 26 (*) >90 (mL/min)   GFR calc Af Amer 30 (*) >90 (mL/min)  URINALYSIS, ROUTINE W REFLEX MICROSCOPIC     Status: Abnormal   Collection Time   12/05/11  5:30 PM      Component Value Range   Color, Urine YELLOW  YELLOW    APPearance CLEAR  CLEAR    Specific Gravity, Urine 1.018  1.005 - 1.030    pH 5.0  5.0 - 8.0    Glucose, UA 250 (*) NEGATIVE (mg/dL)   Hgb urine dipstick NEGATIVE  NEGATIVE     Bilirubin Urine NEGATIVE  NEGATIVE    Ketones, ur NEGATIVE  NEGATIVE (mg/dL)   Protein, ur 30 (*) NEGATIVE (mg/dL)   Urobilinogen, UA 1.0  0.0 - 1.0 (mg/dL)   Nitrite NEGATIVE  NEGATIVE    Leukocytes, UA NEGATIVE  NEGATIVE   URINE MICROSCOPIC-ADD ON     Status: Abnormal   Collection Time   12/05/11  5:30 PM      Component Value Range   Squamous Epithelial / LPF RARE  RARE    WBC, UA 0-2  <3 (WBC/hpf)   Casts GRANULAR CAST (*) NEGATIVE      Impression   *Near syncope  Ischemic heart disease  Benign hypertensive heart disease without heart failure  Diabetes mellitus type 2, insulin dependent  Hypercholesterolemia  Dehydration  Hyperglycemia  History of hypoglycemia  Altered mental state  Frequency of urination and polyuria  Chest pain  CKD (chronic kidney disease)  Plan  Admit to the hospital and start a workup, cycle cardiac enzymes to rule out myocardial ischemia, monitor blood glucose and discontinue sulfonylurea, I explained to the patient and family that it is unsafe for him to continue taking a sulfonylurea at this time especially given his history of severe hypoglycemia.  In addition, check an echocardiogram, carotid Dopplers, check and MRI to rule out TIA, check urinalysis and culture to rule out infection, gently hydrate with IV fluids, check PT OT consult recommendations, please see orders and follow hospital course.  Standley Dakins MD [facility] [place], [place] [phone] 12/05/2011, 6:24 PM

## 2011-12-05 NOTE — ED Notes (Signed)
Patient states he was at the barbershop and started feeling very sick on his stomach, c/o dry heaves. Upon arrival to ed states he is feeling much better. Alert oriented, however doesn't remember much about being at the barbershop.

## 2011-12-05 NOTE — ED Notes (Signed)
Family at bedside. 

## 2011-12-05 NOTE — ED Provider Notes (Signed)
History     CSN: [id number]  Arrival date & time 12/05/11  1444   First MD Initiated Contact with Patient 12/05/11 1445      Chief Complaint  Patient presents with  . Nausea    (Consider location/radiation/quality/duration/timing/severity/associated sxs/prior treatment) HPI Comments: Patient with medical history significant for CABG in 1997, high blood pressure, high cholesterol, diabetes on insulin -- presents with complaint of a near syncopal episode associated with nausea and abdominal pain that occurred while he was at the barber shop this afternoon. Per patient's family, he was minimally responsive for about 2 minutes before gradually returning to baseline. During this event the patient complained of generalized abdominal pain which is also resolved. He denies having any chest pain or vomiting. He denies any palpitations. No recent illnesses. No medication changes. Onset was acute. Course is resolved. Nothing made patient symptoms better or worse.   Patient is a 78 y.o. male presenting with syncope. The history is provided by the patient.  Loss of Consciousness This is a new problem. The current episode started today. The problem has been resolved. Associated symptoms include abdominal pain and nausea. Pertinent negatives include no chest pain, coughing, diaphoresis, fever, headaches, neck pain, numbness, rash, vomiting or weakness. The symptoms are aggravated by nothing. He has tried nothing for the symptoms.    Past Medical History  Diagnosis Date  . Coronary artery disease     post CABG in 1997  . Diabetes mellitus     type 2  . Hypertension   . Hyperlipidemia   . Hypercholesterolemia   . Atypical chest pain   . Acute on chronic renal insufficiency   . Hypernatremia     Past Surgical History  Procedure Date  . Coronary artery bypass graft 1997    x3 -- patent internal mammary graft to saphenous vein grafts x3  . Tonsillectomy   . Cardiac catheterization 01/26/1999     patent internal mammary graft to saphenous vein grafts x3 -- singnificant three-vessel coroanry artery disease -- potential sites of ischemia involving the intermediate branch, circumflex branch and the distal LAD through diffuse disease, as well as steal type syndrome -- normal LV function -- W. Ashley Royalty., M.D.      History reviewed. No pertinent family history.  History  Substance Use Topics  . Smoking status: Never Smoker   . Smokeless tobacco: Never Used  . Alcohol Use: No      Review of Systems  Constitutional: Negative for fever and diaphoresis.  HENT: Negative for neck pain.   Eyes: Negative for redness.  Respiratory: Negative for cough and shortness of breath.   Cardiovascular: Positive for syncope. Negative for chest pain, palpitations and leg swelling.  Gastrointestinal: Positive for nausea and abdominal pain. Negative for vomiting.  Genitourinary: Negative for dysuria.  Musculoskeletal: Negative for back pain.  Skin: Negative for rash.  Neurological: Negative for syncope, weakness, light-headedness, numbness and headaches.    Allergies  Review of patient's allergies indicates no known allergies.  Home Medications   Current Outpatient Rx  Name Route Sig Dispense Refill  . AMLODIPINE BESYLATE 10 MG PO TABS Oral Take 10 mg by mouth daily.    . ASPIRIN 81 MG PO TABS Oral Take 81 mg by mouth daily.      . ATORVASTATIN CALCIUM 40 MG PO TABS Oral Take 40 mg by mouth daily.    Marland Kitchen GLIMEPIRIDE 4 MG PO TABS Oral Take 4 mg by mouth 2 (two) times daily.    Marland Kitchen  INSULIN GLARGINE 100 UNIT/ML [place] SOLN Subcutaneous Inject 52 Units into the skin daily. As directed    . ISOSORBIDE MONONITRATE ER 60 MG PO TB24 Oral Take 60 mg by mouth daily.    Marland Kitchen LOSARTAN POTASSIUM 50 MG PO TABS Oral Take 50 mg by mouth daily.    Marland Kitchen METOPROLOL TARTRATE 50 MG PO TABS Oral Take 50 mg by mouth 2 (two) times daily.    Marland Kitchen OMEPRAZOLE MAGNESIUM 20 MG PO TBEC Oral Take 20 mg by mouth daily.      BP  115/56  Pulse 63  Temp(Src) 97.4 F (36.3 C) (Oral)  Resp 20  SpO2 98%  Physical Exam  Nursing note and vitals reviewed. Constitutional: He appears well-developed and well-nourished.  HENT:  Head: Normocephalic and atraumatic.  Mouth/Throat: Mucous membranes are normal. Mucous membranes are not dry.  Eyes: Conjunctivae are normal.  Neck: Trachea normal and normal range of motion. Neck supple. Normal carotid pulses and no JVD present. No muscular tenderness present. No tracheal deviation present.  Cardiovascular: Normal rate, regular rhythm, S1 normal, S2 normal, normal heart sounds and intact distal pulses.  Exam reveals no distant heart sounds and no decreased pulses.   No murmur heard. Pulmonary/Chest: Effort normal and breath sounds normal. No respiratory distress. He has no wheezes. He exhibits no tenderness.  Abdominal: Soft. Normal aorta and bowel sounds are normal. There is no tenderness. There is no rebound and no guarding.  Musculoskeletal: He exhibits no edema.  Neurological: He is alert. He has normal strength. No cranial nerve deficit or sensory deficit. GCS eye subscore is 4. GCS verbal subscore is 5. GCS motor subscore is 6.  Skin: Skin is warm and dry. He is not diaphoretic. No cyanosis. No pallor.  Psychiatric: He has a normal mood and affect.    ED Course  Procedures (including critical care time)  Labs Reviewed  GLUCOSE, CAPILLARY - Abnormal; Notable for the following:    Glucose-Capillary 213 (*)    All other components within normal limits  CBC - Abnormal; Notable for the following:    RBC 3.78 (*)    Hemoglobin 11.6 (*)    HCT 33.9 (*)    All other components within normal limits  BASIC METABOLIC PANEL - Abnormal; Notable for the following:    Glucose, Bld 238 (*)    BUN 30 (*)    Creatinine, Ser 2.25 (*)    GFR calc non Af Amer 26 (*)    GFR calc Af Amer 30 (*)    All other components within normal limits  URINALYSIS, ROUTINE W REFLEX MICROSCOPIC -  Abnormal; Notable for the following:    Glucose, UA 250 (*)    Protein, ur 30 (*)    All other components within normal limits  URINE MICROSCOPIC-ADD ON - Abnormal; Notable for the following:    Casts GRANULAR CAST (*) HYALINE CASTS   All other components within normal limits  DIFFERENTIAL   No results found.   1. Near syncope     3:15 PM Patient seen and examined. Work-up initiated.    Vital signs reviewed and are as follows: Filed Vitals:   12/05/11 1435  BP: 115/56  Pulse: 63  Temp: 97.4 F (36.3 C)  Resp: 20    Date: 12/05/2011  Rate: 63  Rhythm: normal sinus rhythm  QRS Axis: normal  Intervals: normal  ST/T Wave abnormalities: nonspecific T wave changes  Conduction Disutrbances:none  Narrative Interpretation:   Old EKG Reviewed: unchanged from 11/20/2006  Patient was d/w and seen by Dr. Golda Acre.   Work-up is not concerning, however will admit for further eval and work-up.   [facility].   MDM  Near syncope in an 78yo/m in setting of previous CAD/CABG. Will admit given high risk of cardiogenic etiology.         Renne Crigler, [place] 12/05/11 1844

## 2011-12-05 NOTE — ED Notes (Signed)
Attempted to phone floor report. Receiving RN unable to take report at this time and will call back

## 2011-12-05 NOTE — ED Notes (Signed)
Notified RN of CBG of 213

## 2011-12-06 ENCOUNTER — Inpatient Hospital Stay (HOSPITAL_COMMUNITY): Payer: Medicare Other

## 2011-12-06 DIAGNOSIS — I2510 Atherosclerotic heart disease of native coronary artery without angina pectoris: Secondary | ICD-10-CM

## 2011-12-06 DIAGNOSIS — E1165 Type 2 diabetes mellitus with hyperglycemia: Secondary | ICD-10-CM

## 2011-12-06 DIAGNOSIS — G459 Transient cerebral ischemic attack, unspecified: Secondary | ICD-10-CM

## 2011-12-06 DIAGNOSIS — R55 Syncope and collapse: Secondary | ICD-10-CM

## 2011-12-06 DIAGNOSIS — I059 Rheumatic mitral valve disease, unspecified: Secondary | ICD-10-CM

## 2011-12-06 DIAGNOSIS — R7989 Other specified abnormal findings of blood chemistry: Secondary | ICD-10-CM

## 2011-12-06 LAB — GLUCOSE, CAPILLARY
Glucose-Capillary: 178 mg/dL — ABNORMAL HIGH (ref 70–99)
Glucose-Capillary: 203 mg/dL — ABNORMAL HIGH (ref 70–99)

## 2011-12-06 LAB — CARDIAC PANEL(CRET KIN+CKTOT+MB+TROPI)
CK, MB: 2.2 ng/mL (ref 0.3–4.0)
CK, MB: 2.2 ng/mL (ref 0.3–4.0)
Relative Index: 1.1 (ref 0.0–2.5)
Troponin I: <0.3 ng/mL (ref <0.30)

## 2011-12-06 LAB — LIPID PANEL
LDL Cholesterol: 48 mg/dL (ref 0–99)
VLDL: 48 mg/dL — ABNORMAL HIGH (ref 0–40)

## 2011-12-06 LAB — RAPID URINE DRUG SCREEN, HOSP PERFORMED
Benzodiazepines: NOT DETECTED
Cocaine: NOT DETECTED

## 2011-12-06 MED ORDER — CLOPIDOGREL BISULFATE 75 MG PO TABS
75.0000 mg | ORAL_TABLET | Freq: Every day | ORAL | Status: DC
  Administered 2011-12-07: 75 mg via ORAL
  Filled 2011-12-06 (×2): qty 1

## 2011-12-06 MED ORDER — GEMFIBROZIL 600 MG PO TABS
600.0000 mg | ORAL_TABLET | Freq: Two times a day (BID) | ORAL | Status: DC
  Administered 2011-12-06 – 2011-12-07 (×3): 600 mg via ORAL
  Filled 2011-12-06 (×4): qty 1

## 2011-12-06 NOTE — Evaluation (Signed)
Physical Therapy Evaluation Patient Details Name: Edwardo Wojnarowski MRN: [id number] DOB: [birth year] Today's Date: 12/06/2011 Time: 7829-5621 PT Time Calculation (min): 15 min  PT Assessment / Plan / Recommendation Clinical Impression  Pt with near syncope event.  Pt at baseline with mobility and no PT needs.    PT Assessment  Patent does not need any further PT services    Follow Up Recommendations  No PT follow up    Barriers to Discharge        lEquipment Recommendations  None recommended by PT    Recommendations for Other Services     Frequency      Precautions / Restrictions Restrictions Weight Bearing Restrictions: No   Pertinent Vitals/Pain N/A      Mobility  Bed Mobility Bed Mobility: Supine to Sit Supine to Sit: 6: Modified independent (Device/Increase time);With rails Details for Bed Mobility Assistance: Increased time Transfers Transfers: Sit to Stand;Stand to Sit Sit to Stand: 7: Independent Stand to Sit: 7: Independent Ambulation/Gait Ambulation/Gait Assistance: 6: Modified independent (Device/Increase time) Ambulation Distance (Feet): 60 Feet (pt to w/c to go to test) Assistive device: None Gait Pattern:  (slight limp on lt and slight veer to lt.)    Exercises     PT Diagnosis:    PT Problem List:   PT Treatment Interventions:     PT Goals    Visit Information  Last PT Received On: 12/06/11 Assistance Needed: +1 PT/OT Co-Evaluation/Treatment: Yes    Subjective Data  Subjective: Pt states he feels much better than yesterday. Patient Stated Goal: Return home to plant his sweet potatoes   Prior Functioning  Home Living Lives With: Spouse Available Help at Discharge: Family;Available 24 hours/day Type of Home: House Home Access: Stairs to enter [employer] of Steps: 6 Entrance Stairs-Rails: Can reach both Home Layout: One level Bathroom Shower/Tub: Engineer, manufacturing systems: Standard Bathroom Accessibility: Yes How  Accessible: Accessible via walker Home Adaptive Equipment: None Prior Function Level of Independence: Independent Able to Take Stairs?: Yes Driving: No Vocation: Retired Comments: Wife was driving pt. Communication Communication: HOH    Cognition  Overall Cognitive Status: Appears within functional limits for tasks assessed/performed Arousal/Alertness: Awake/alert Orientation Level: Appears intact for tasks assessed Behavior During Session: [facility] for tasks performed    Extremity/Trunk Assessment Right Upper Extremity Assessment RUE ROM/Strength/Tone: Within functional levels RUE Sensation: WFL - Light Touch RUE Coordination: WFL - gross/fine motor Left Upper Extremity Assessment LUE ROM/Strength/Tone: Within functional levels LUE Sensation: WFL - Light Touch LUE Coordination: WFL - gross/fine motor Right Lower Extremity Assessment RLE ROM/Strength/Tone: Within functional levels RLE Coordination: WFL - gross/fine motor Left Lower Extremity Assessment LLE ROM/Strength/Tone: Within functional levels LLE Coordination: WFL - gross/fine motor   Balance Dynamic Sitting Balance Dynamic Sitting - Balance Support: During functional activity Dynamic Sitting - Level of Assistance: 7: Independent Static Standing Balance Static Standing - Balance Support: During functional activity Static Standing - Level of Assistance: 7: Independent  End of Session PT - End of Session Activity Tolerance: Patient tolerated treatment well Patient left: Other (comment) (taken in w/c to test) Nurse Communication: Mobility status   Vania Rosero 12/06/2011, 10:36 AM  Skip Mayer PT [phone]

## 2011-12-06 NOTE — Progress Notes (Signed)
PATIENT DETAILS Name: Dondi Aime Age: 78 y.o. Sex: male Date of Birth: 03/06/1928 Admit Date: 12/05/2011 ZOX:[id number] Patty Sermons, MD, MD POA:   CONSULTS: none  Interim history:       78yo AAM with hx of ischemic heart disease. He has diabetes mellitus with hx of hypoglycemia, hypercholesterolemia, and essential hypertension. He is status post coronary artery bypass graft surgery in 1997. His last nuclear stress test was in 2007 and at that time he did demonstrate mild reversible ischemia but was continued on medical therapy and has done well. His echocardiogram in November 2009 showed an ejection fraction of 55-60% and mild pulmonary hypertension.       He was admitted last evening after near syncopal event.  No hypoglycemia was noted by EMS or on arrival to ED. He denies any new medications. Chart review shows his Losartan was restarted in 09/2011 by PCP, but nothing else of note.   Subjective: Feeling well today. Anxious for d/c. Denies any headache, dizziness, chest pain or sob.  Has complete recollection of 'event' stating he was unable to speak and felt "sick". Episode lasted approximately 4 minutes and resolved upon arrival to ED.   Objective: Vital signs in last 24 hours: Temp:  [97.2 F (36.2 C)-98 F (36.7 C)] 97.2 F (36.2 C) (06/07 1252) Pulse Rate:  [57-114] 66  (06/07 1252) Resp:  [16-23] 18  (06/07 1252) BP: (111-175)/(56-90) 175/89 mmHg (06/07 1252) SpO2:  [93 %-100 %] 93 % (06/07 1252) Weight:  [95.4 kg (210 lb 5.1 oz)] 95.4 kg (210 lb 5.1 oz) (06/06 1945) Weight change:  Last BM Date: 12/04/11  Intake/Output from previous day:  Intake/Output Summary (Last 24 hours) at 12/06/11 1303 Last data filed at 12/06/11 0800  Gross per 24 hour  Intake 1067.5 ml  Output    725 ml  Net  342.5 ml     Physical Exam:  Gen:  Awake, alert lying supine in stretcher in NAD Cardiovascular:  S1S2 RRR, no m/r/g, no LE edema Respiratory: CTAB, no w/r/c, no increased  wob Gastrointestinal: abdomen soft, NT/ND, BS+ Extremities: no c/c/e Neuro: no focal m/s deficits on exam   Lab Results:  Lab 12/05/11 2021 12/05/11 1514  HGB 11.9* 11.6*  HCT 35.4* 33.9*  WBC 7.8 6.7  PLT 174 175     Lab 12/05/11 2021 12/05/11 1514  NA -- 141  K -- 4.6  CL -- 106  CO2 -- 22  GLUCOSE -- 238*  BUN -- 30*  CREATININE 2.22* 2.25*  CALCIUM -- 9.5  MG -- --  PHOS -- --  Results for MYCAH, FORMICA (MRN [id number]) as of 12/06/2011 12:57  Ref. Range 12/05/2011 20:22 12/06/2011 04:10  CK, MB Latest Range: 0.3-4.0 ng/mL 2.1 2.2  CK Total Latest Range: 7-232 U/L 167 160  Troponin I Latest Range: <0.30 ng/mL <0.30 <0.30  Pro B Natriuretic peptide (BNP) Latest Range: 0-450 pg/mL  477.3 (H)   Results for SEKAI, GITLIN (MRN [id number]) as of 12/06/2011 12:57  Ref. Range 12/06/2011 04:10  Cholesterol Latest Range: 0-200 mg/dL 086  Triglycerides Latest Range: <150 mg/dL 578 (H)  HDL Latest Range: >39 mg/dL 24 (L)  LDL (calc) Latest Range: 0-99 mg/dL 48  VLDL Latest Range: 0-40 mg/dL 48 (H)  Total CHOL/HDL Ratio No range found 5.0    Studies/Results: No results found.  Medications: Scheduled Meds:   . amLODipine  10 mg Oral Daily  . aspirin  81 mg Oral Daily  . atorvastatin  40 mg Oral Daily  .  heparin  5,000 Units Subcutaneous Q8H  . insulin aspart  0-15 Units Subcutaneous TID WC  . insulin glargine  45 Units Subcutaneous Daily  . isosorbide mononitrate  60 mg Oral Daily  . losartan  50 mg Oral Daily  . metoprolol  50 mg Oral BID  . pantoprazole  40 mg Oral Q1200  . pneumococcal 23 valent vaccine  0.5 mL Intramuscular Tomorrow-1000  . DISCONTD: aspirin  81 mg Oral Daily  . DISCONTD: omeprazole  20 mg Oral Daily   Continuous Infusions:   . sodium chloride 75 mL/hr at 12/05/11 2058   PRN Meds:.acetaminophen, senna-docusate Antibiotics: Anti-infectives    None       Assessment/Plan:  Principal Problem:  *Near syncope Active Problems:  Ischemic heart  disease  Benign hypertensive heart disease without heart failure  Diabetes mellitus type 2, insulin dependent  Hypercholesterolemia  Dehydration  Hyperglycemia  History of hypoglycemia  Altered mental state  Frequency of urination and polyuria  Chest pain  CKD (chronic kidney disease)  1. Near syncope: Unclear etiology. Cannot rule out hypoglycemic event as pt has hx of same. Given profound hx of vascular disease, will complete syncope workup to include MRI/MRA brain, carotid studies and 2Decho. Cardiac enzymes negative thus far. No evidence of infectious etiology with u/a unremarkable, normal WBC, afebrile.  2. IDDM with hx of hypoglycemia: Again, no hypoglycemic event was recorded. His sulfonylurea has been discontinued given his hx of low cbg's. Continue Lantus, SSI for now. A1C 7.8%  3. CKD: At baseline  4. Hx CAD: No cardiac complaints at present. EKG without evidence of ischemia. Cardiac enzymes negative x 2. Continue home meds.   5. Dyslipidemia: On statin prior to admit. FLP show TG 241. Will add Gemfibrozil. Outpatient follow-up with PCP.   6. Anemia, normocytic (mild): Chronic disease in setting of CKD. Monitor.  7. Prophylaxis:  SQ heparin for DVT proph  8. Dispo: Home when inpt workup complete (this pm vs am 6/8)  Cordelia Pen, NP-C [facility]  pgr [phone]  Attending -I have seen and examined the patient, I agree with the assessment and plan as documented above.  Windell Norfolk MD   LOS: 1 day    12/06/2011, 1:03 PM

## 2011-12-06 NOTE — Progress Notes (Signed)
MRI Brain and clinical history d/w Dr Venetia Maxon from the Neurosurgical service, he advises no further work or intervention up regarding the sub-dural hygroma.

## 2011-12-06 NOTE — Progress Notes (Signed)
VASCULAR LAB PRELIMINARY  PRELIMINARY  PRELIMINARY  PRELIMINARY  Carotid duplex has been  completed.    Preliminary report: Right:  Moderate non calcific plaque in the proximal ICA,  Probably a 40 to 59% stenosis.  Left:  Mild to moderate plaque in proximal ICA.  No significant ICA stenosis.  Vertebral artery flow antegrade bilaterally.  Vanna Scotland,   RVT 12/06/2011, 3:47 PM

## 2011-12-06 NOTE — Progress Notes (Signed)
Routine EEG completed.  

## 2011-12-06 NOTE — Evaluation (Signed)
Occupational Therapy Evaluation and Discharge Patient Details Name: [name] MRN: [id number] DOB: [birth year] Today's Date: 12/06/2011 Time: 4098-1191 OT Time Calculation (min): 19 min  OT Assessment / Plan / Recommendation Clinical Impression  Pt. presents with near syncope and hypoglycemia. All education completed and pt. at baseline with ADLs and mobility. Will sign off acutely.    OT Assessment  Patient does not need any further OT services    Follow Up Recommendations  No OT follow up       Equipment Recommendations  None recommended by OT          Precautions / Restrictions Precautions Precautions: None Restrictions Weight Bearing Restrictions: No       ADL  Eating/Feeding: Simulated;Independent Where Assessed - Eating/Feeding: Chair Grooming: Performed;Wash/dry hands;Wash/dry face;Modified independent Where Assessed - Grooming: Unsupported standing Upper Body Bathing: Simulated;Modified independent Where Assessed - Upper Body Bathing: Unsupported sitting Lower Body Bathing: Simulated;Modified independent Where Assessed - Lower Body Bathing: Unsupported sit to stand Upper Body Dressing: Performed;Modified independent (don gown) Where Assessed - Upper Body Dressing: Unsupported sitting Lower Body Dressing: Performed;Modified independent (don pants and socks) Where Assessed - Lower Body Dressing: Unsupported sit to stand Toilet Transfer: Performed;Modified independent (in standing) Toilet Transfer Method: Other (comment) (stand) Toilet Transfer Equipment: Regular height toilet Toileting - Clothing Manipulation and Hygiene: Performed;Independent Where Assessed - Toileting Clothing Manipulation and Hygiene: Standing Tub/Shower Transfer Method: Not assessed Transfers/Ambulation Related to ADLs: Pt. mod I with ambulation due to increased time ADL Comments: Pt. completed LB ADLs by crossing foot over opposite knee to complete task.         Visit Information  Last  OT Received On: 12/06/11 Assistance Needed: +1    Subjective Data  Subjective: "Oh I was doing fine"   Prior Functioning  Home Living Lives With: Spouse Available Help at Discharge: Family;Available 24 hours/day Type of Home: House Home Access: Stairs to enter [employer] of Steps: 6 Entrance Stairs-Rails: Can reach both Home Layout: One level Bathroom Shower/Tub: Engineer, manufacturing systems: Standard Bathroom Accessibility: Yes How Accessible: Accessible via walker Home Adaptive Equipment: None Prior Function Level of Independence: Independent Able to Take Stairs?: Yes Driving: No Vocation: Retired Musician: Clinical cytogeneticist  Overall Cognitive Status: Appears within functional limits for tasks assessed/performed Arousal/Alertness: Awake/alert Orientation Level: Appears intact for tasks assessed Behavior During Session: [facility] for tasks performed    Extremity/Trunk Assessment Right Upper Extremity Assessment RUE ROM/Strength/Tone: Within functional levels RUE Sensation: WFL - Light Touch RUE Coordination: WFL - gross/fine motor Left Upper Extremity Assessment LUE ROM/Strength/Tone: Within functional levels LUE Sensation: WFL - Light Touch LUE Coordination: WFL - gross/fine motor   Mobility Bed Mobility Bed Mobility: Supine to Sit Supine to Sit: 6: Modified independent (Device/Increase time);With rails Details for Bed Mobility Assistance: Increased time Transfers Transfers: Sit to Stand;Stand to Sit Sit to Stand: 7: Independent Stand to Sit: 7: Independent         End of Session OT - End of Session Equipment Utilized During Treatment: Gait belt Activity Tolerance: Patient tolerated treatment well Patient left: Other (comment) (in wheelchair with transport) Nurse Communication: Mobility status   Danaly Bari, OTR/L Pager [phone] 12/06/2011, 3:24 PM

## 2011-12-06 NOTE — Progress Notes (Signed)
  Echocardiogram 2D Echocardiogram has been performed.  Cathie Beams Deneen 12/06/2011, 12:07 PM

## 2011-12-06 NOTE — Care Management Note (Unsigned)
    Page 1 of 1   12/06/2011     4:28:22 PM   CARE MANAGEMENT NOTE 12/06/2011  Patient:  [name], [name]   Account Number:  [id number]  Date Initiated:  12/06/2011  Documentation initiated by:  Letha Cape  Subjective/Objective Assessment:   dx  syncope  admit - lives with spouse.  PTA independent.     Action/Plan:   pt eval- no pt needs.   Anticipated DC Date:  12/07/2011   Anticipated DC Plan:  HOME/SELF CARE      DC Planning Services  CM consult      Choice offered to / List presented to:             Status of service:  In process, will continue to follow Medicare Important Message given?   (If response is "NO", the following Medicare IM given date fields will be blank) Date Medicare IM given:   Date Additional Medicare IM given:    Discharge Disposition:    Per UR Regulation:  Reviewed for med. necessity/level of care/duration of stay  If discussed at Long Length of Stay Meetings, dates discussed:    Comments:  PCP Dr. Patty Sermons  12/06/11 10:54 Letha Cape RN, BSN  [phone] patient lives with spouse, pta independent, patient for EEG today.  patient has medication coverage and transportation. NCM will continue to follow for dc needs.

## 2011-12-07 DIAGNOSIS — E1165 Type 2 diabetes mellitus with hyperglycemia: Secondary | ICD-10-CM

## 2011-12-07 DIAGNOSIS — G459 Transient cerebral ischemic attack, unspecified: Secondary | ICD-10-CM

## 2011-12-07 DIAGNOSIS — I2510 Atherosclerotic heart disease of native coronary artery without angina pectoris: Secondary | ICD-10-CM

## 2011-12-07 DIAGNOSIS — I6340 Cerebral infarction due to embolism of unspecified cerebral artery: Secondary | ICD-10-CM

## 2011-12-07 DIAGNOSIS — R55 Syncope and collapse: Secondary | ICD-10-CM

## 2011-12-07 LAB — GLUCOSE, CAPILLARY: Glucose-Capillary: 93 mg/dL (ref 70–99)

## 2011-12-07 MED ORDER — GEMFIBROZIL 600 MG PO TABS: 600.0000 mg | ORAL_TABLET | Freq: Two times a day (BID) | ORAL | Status: DC

## 2011-12-07 MED ORDER — CLOPIDOGREL BISULFATE 75 MG PO TABS: 75.0000 mg | ORAL_TABLET | Freq: Every day | ORAL | Status: DC

## 2011-12-07 NOTE — ED Provider Notes (Signed)
Medical screening examination/treatment/procedure(s) were conducted as a shared visit with non-physician practitioner(s) and myself.  I personally evaluated the patient during the encounter  See my prior note.   Nat Christen, MD 12/07/11 [phone]

## 2011-12-07 NOTE — Progress Notes (Signed)
Patient is going to be discharged home today. IV site removed. Discharge instructions reviewed by patient. Harless Litten.RN.

## 2011-12-07 NOTE — Consult Note (Signed)
Referring Physician: Jerral Ralph    Chief Complaint: pre-syncope/CVA  HPI: [name] is an 78 y.o. male admitted to [facility] 12/05/11 after a pre-syncopal episode at the [employer].  Patient developed nausea/abd pain and was minimally responsive for about 2 minutes before returning to baseline.  Hospital work up revealed a .  The patient has stroke risk factors including HTN, CAD, Dm2 and hyperlipidemia. He has had no recurrent 'spells' since admission and is back to baseline.  Neurology consult is requested prior to patient discharge.  Subjective: "I feel good.  I felt really badly the day I was admitted.  But now I'm fine."   Past Medical History  Diagnosis Date  . Coronary artery disease     post CABG in 1997  . Diabetes mellitus     type 2  . Hypertension   . Hyperlipidemia   . Hypercholesterolemia   . Atypical chest pain   . Acute on chronic renal insufficiency   . Hypernatremia   . History of hypoglycemia   . CKD (chronic kidney disease)     Past Surgical History  Procedure Date  . Coronary artery bypass graft 1997    x3 -- patent internal mammary graft to saphenous vein grafts x3  . Tonsillectomy   . Cardiac catheterization 01/26/1999    patent internal mammary graft to saphenous vein grafts x3 -- singnificant three-vessel coroanry artery disease -- potential sites of ischemia involving the intermediate branch, circumflex branch and the distal LAD through diffuse disease, as well as steal type syndrome -- normal LV function -- W. Ashley Royalty., M.D.      History reviewed. No pertinent family history. Social History:  No tobacco, IDU, ETOH.  Allergies: No Known Allergies  Medications: Scheduled:   . amLODipine  10 mg Oral Daily  . atorvastatin  40 mg Oral Daily  . clopidogrel  75 mg Oral Q breakfast  . gemfibrozil  600 mg Oral BID AC  . heparin  5,000 Units Subcutaneous Q8H  . insulin aspart  0-15 Units Subcutaneous TID WC  . insulin glargine  45 Units Subcutaneous  Daily  . isosorbide mononitrate  60 mg Oral Daily  . losartan  50 mg Oral Daily  . metoprolol  50 mg Oral BID  . pantoprazole  40 mg Oral Q1200  . DISCONTD: aspirin  81 mg Oral Daily    ROS: No fever chills or weight change.  No headache or dizziness.  No N/V/D/C presently. No extremity weakness or paresthesia.  No troubles swallowing, finding words. Chronic hearing loss; L>R.  No double vision or blurred vision.  No change in bowel or bladder habits.  Physical Examination: Blood pressure 149/77, pulse 60, temperature 98.8 F (37.1 C), temperature source Oral, resp. rate 18, height 6\' 2"  (1.88 m), weight 95.4 kg (210 lb 5.1 oz), SpO2 94.00%.  Neurologic Examination: Mental Status: Alert, oriented, thought content appropriate.  Speech fluent without evidence of aphasia. Able to follow 3 step commands without difficulty. Cranial Nerves: II- Visual fields grossly intact. III/IV/VI-Extraocular movements intact.  No nystagmus. Facial sensation intact. V/VII-Smile symmetric VIII-HOH IX/X-normal gag XI-bilateral shoulder shrug XII-midline tongue extension Motor: 5/5 bilaterally with normal tone and bulk Sensory: Pinprick and light touch intact throughout, bilaterally Deep Tendon Reflexes: 2+ and symmetric throughout Plantars: Downgoing bilaterally Cerebellar: Normal finger-to-nose.  Basic Metabolic Panel:  Lab 12/05/11 1610 12/05/11 1514  NA -- 141  K -- 4.6  CL -- 106  CO2 -- 22  GLUCOSE -- 238*  BUN --  30*  CREATININE 2.22* 2.25*  CALCIUM -- 9.5  MG -- --  PHOS -- --   CBC:  Lab 12/05/11 2021 12/05/11 1514  WBC 7.8 6.7  NEUTROABS -- 4.6  HGB 11.9* 11.6*  HCT 35.4* 33.9*  MCV 89.6 89.7  PLT 174 175   Cardiac Enzymes:  Lab 12/06/11 1325 12/06/11 0410 12/05/11 2022  CKTOTAL 197 160 167  CKMB 2.2 2.2 2.1  CKMBINDEX -- -- --  TROPONINI <0.30 <0.30 <0.30   BNP:  Lab 12/06/11 0410  PROBNP 477.3*   CBG:  Lab 12/07/11 0805 12/06/11 2124 12/06/11 1629 12/06/11  1245 12/06/11 0737 12/05/11 2110  GLUCAP 93 136* 171* 203* 178* 236*   Hemoglobin A1C:  Lab 12/05/11 2021  HGBA1C 7.8*   Fasting Lipid Panel:  Lab 12/06/11 0410  CHOL 120  HDL 24*  LDLCALC 48  TRIG 161*  CHOLHDL 5.0  LDLDIRECT --   Urine Drug Screen: Drugs of Abuse     Component Value Date/Time   LABOPIA NONE DETECTED 12/05/2011 1730   COCAINSCRNUR NONE DETECTED 12/05/2011 1730   LABBENZ NONE DETECTED 12/05/2011 1730   AMPHETMU NONE DETECTED 12/05/2011 1730   THCU NONE DETECTED 12/05/2011 1730   LABBARB NONE DETECTED 12/05/2011 1730    Urinalysis:  Lab 12/05/11 1730  COLORURINE YELLOW  LABSPEC 1.018  PHURINE 5.0  GLUCOSEU 250*  HGBUR NEGATIVE  BILIRUBINUR NEGATIVE  KETONESUR NEGATIVE  PROTEINUR 30*  UROBILINOGEN 1.0  NITRITE NEGATIVE  LEUKOCYTESUR NEGATIVE   STUDIES:  12/06/2011  MRI HEAD WITHOUT CONTRAST MRA HEAD WITHOUT CONTRAST   Findings:  Small foci of acute infarction affect the right frontal periventricular white matter and the right parieto-occipital subcortical white matter without associated hemorrhage, mass lesion, or hydrocephalus.  Moderate sized extra-axial fluid collection over the left frontal lobe, 15 mm thick, represents a subdural hygroma of indeterminate a.  Tiny focus chronic hemorrhage right posterior frontal operculum, likely chronic hypertensive cerebrovascular disease.  Atrophy with widespread subcortical and periventricular white matter signal abnormality.  Remote chronic right parietal infarction with gliosis and encephalomalacia. Mild chronic sinus disease.   IMPRESSION: Small foci of acute infarction in the right hemisphere supratentorial white matter.  No hemorrhage or mass effect. Chronic changes with atrophy, white matter disease, chronic hemorrhage and remote infarction as described.  Moderately large left subdural hygroma with mass effect on the frontal lobe, of indeterminate age.  Elsie Stain, M.D.  12/06/11 MRA HEAD  Findings: The internal  carotid arteries are widely patent.  The basilar artery is widely patent.  The left vertebral is slightly larger than the right, but both contribute to formation of the basilar.  Atretic right anterior cerebral artery, A1 segment.  No proximal MCA or PCA stenosis.  Both anterior cerebrals distally fed from the left.  No intracranial aneurysm.   IMPRESSION: No proximal vascular occlusion or significant stenosis.  Elsie Stain, M.D.   12/06/11 2 D ECHO: - Left ventricle: Wall thickness was normal. Systolic function was normal. The estimated ejection fraction  55% to 60%. Doppler parameters are consistent with abnormal left ventricular relaxation (grade 1 diastolic dysfunction). - Mitral valve: Mild regurgitation.  12/06/11 Carotid Dopplers: Right ICA stenosis of 40 to 59%. No significant ICA stenosis on left.Vertebral artery flow antegrade bilaterally  Assessment: 78 y.o. male admitted with pre-syncope and found to have had a small acute infarction in the right hemisphere supratentorial white matter during work-up.  This appears ischemic in nature.  The patient has returned to baseline and  has had no recurrent syncope.  MRI findings do not correlate with his presentation of nausea and pre-syncope, and likely was an incidental finding.  Nonetheless, the patient has several risk factors for stroke and was on ASA 81 mg prior to arrival.    A1C 7.8 LDL 48, TG 241 BP 149/77  Stroke Risk Factors - HTN, CAD, Dm2 and hyperlipidemia  REC: Agree with changing ASA to Plavix. Recommend aggressive risk factor modification; tighter BS control if severe hypoglycemia can be avoided, and blood pressure control; goal <130/80. Follow up with [facility] in 2 months.  To be seen by Dr. Thad Ranger.  Marya Fossa PA-C [facility] [phone] 12/07/2011, 10:49 AM  Patient seen and examined. I agree with the above.  No further neurologic intervention is recommended at this time.    Thana Farr, MD [facility] [phone] 12/07/2011  5:47 PM

## 2011-12-07 NOTE — Discharge Summary (Addendum)
PATIENT DETAILS Name: [name] Age: 78 y.o. Sex: male Date of Birth: 08/03/1927 MRN: [id number]. Admit Date: 12/05/2011 Admitting Physician: Cleora Fleet, MD [id number] Darrell Sermons, MD, MD  PRIMARY DISCHARGE DIAGNOSIS:  Principal Problem:  *Near syncope Active Problems:  Acute CVA  Ischemic heart disease  Benign hypertensive heart disease without heart failure  Diabetes mellitus type 2, insulin dependent  Hypercholesterolemia  Dehydration  Hyperglycemia  History of hypoglycemia  Altered mental state  Frequency of urination and polyuria  Chest pain  CKD (chronic kidney disease)      PAST MEDICAL HISTORY: Past Medical History  Diagnosis Date  . Coronary artery disease     post CABG in 1997  . Diabetes mellitus     type 2  . Hypertension   . Hyperlipidemia   . Hypercholesterolemia   . Atypical chest pain   . Acute on chronic renal insufficiency   . Hypernatremia   . History of hypoglycemia   . CKD (chronic kidney disease)     DISCHARGE MEDICATIONS: Medication List  As of 12/07/2011  5:09 PM   STOP taking these medications         aspirin 81 MG tablet      glimepiride 4 MG tablet         TAKE these medications         amLODipine 10 MG tablet   Commonly known as: NORVASC   Take 10 mg by mouth daily.      atorvastatin 40 MG tablet   Commonly known as: LIPITOR   Take 40 mg by mouth daily.      clopidogrel 75 MG tablet   Commonly known as: PLAVIX   Take 1 tablet (75 mg total) by mouth daily with breakfast.      gemfibrozil 600 MG tablet   Commonly known as: LOPID   Take 1 tablet (600 mg total) by mouth 2 (two) times daily before a meal.      insulin glargine 100 UNIT/ML injection   Commonly known as: LANTUS   Inject 52 Units into the skin daily. As directed      isosorbide mononitrate 60 MG 24 hr tablet   Commonly known as: IMDUR   Take 60 mg by mouth daily.      losartan 50 MG tablet   Commonly known as: COZAAR   Take 50 mg by mouth daily.        metoprolol 50 MG tablet   Commonly known as: LOPRESSOR   Take 50 mg by mouth 2 (two) times daily.      omeprazole 20 MG tablet   Commonly known as: PRILOSEC OTC   Take 20 mg by mouth daily.             BRIEF HPI:  See H&P, Labs, Consult and Test reports for all details in brief, [name] is an 78 y.o. male with coronary artery disease, status post CABG, diabetes mellitus, insulin requiring, hyperlipidemia and chronic renal insufficiency was sitting in the barber's chair when he developed an episode where he felt "sick" and stop speaking. He began to breathe heavily for a couple of minutes and was not able to talk. The patient says he developed no change in vision. The patient says that he did not fall or pass out. His wife witnessed the event and she reports that he was fairly unresponsive. There were not able to converse with him. This lasted for approximately 2 minutes.   CONSULTATIONS:   neurology  PERTINENT RADIOLOGIC  STUDIES: Mri Brain Without Contrast  12/06/2011  *RADIOLOGY REPORT*  Clinical Data:  Syncopal episode:  12/05/2011.  No focal weakness. History of hypertension.  History of diabetes mellitus.  MRI HEAD WITHOUT CONTRAST MRA HEAD WITHOUT CONTRAST  Technique:  Multiplanar, multiecho pulse sequences of the brain and surrounding structures were obtained without intravenous contrast. Angiographic images of the head were obtained using MRA technique without contrast.  Comparison:  None.  MRI HEAD  Findings:  Small foci of acute infarction affect the right frontal periventricular white matter and the right parieto-occipital subcortical white matter without associated hemorrhage, mass lesion, or hydrocephalus.  Moderate sized extra-axial fluid collection over the left frontal lobe, 15 mm thick, represents a subdural hygroma of indeterminate a.  Tiny focus chronic hemorrhage right posterior frontal operculum, likely chronic hypertensive cerebrovascular disease.  Atrophy with  widespread subcortical and periventricular white matter signal abnormality.  Remote chronic right parietal infarction with gliosis and encephalomalacia. Mild chronic sinus disease.  IMPRESSION: Small foci of acute infarction in the right hemisphere supratentorial white matter.  No hemorrhage or mass effect. Chronic changes with atrophy, white matter disease, chronic hemorrhage and remote infarction as described.  Moderately large left subdural hygroma with mass effect on the frontal lobe, of indeterminate age.  MRA HEAD  Findings: The internal carotid arteries are widely patent.  The basilar artery is widely patent.  The left vertebral is slightly larger than the right, but both contribute to formation of the basilar.  Atretic right anterior cerebral artery, A1 segment.  No proximal MCA or PCA stenosis.  Both anterior cerebrals distally fed from the left.  No intracranial aneurysm.  IMPRESSION: No proximal vascular occlusion or significant stenosis.  Original Report Authenticated By: Elsie Stain, M.D.   Mr Mra Head/brain Wo Cm  12/06/2011  *RADIOLOGY REPORT*  Clinical Data:  Syncopal episode:  12/05/2011.  No focal weakness. History of hypertension.  History of diabetes mellitus.  MRI HEAD WITHOUT CONTRAST MRA HEAD WITHOUT CONTRAST  Technique:  Multiplanar, multiecho pulse sequences of the brain and surrounding structures were obtained without intravenous contrast. Angiographic images of the head were obtained using MRA technique without contrast.  Comparison:  None.  MRI HEAD  Findings:  Small foci of acute infarction affect the right frontal periventricular white matter and the right parieto-occipital subcortical white matter without associated hemorrhage, mass lesion, or hydrocephalus.  Moderate sized extra-axial fluid collection over the left frontal lobe, 15 mm thick, represents a subdural hygroma of indeterminate a.  Tiny focus chronic hemorrhage right posterior frontal operculum, likely chronic  hypertensive cerebrovascular disease.  Atrophy with widespread subcortical and periventricular white matter signal abnormality.  Remote chronic right parietal infarction with gliosis and encephalomalacia. Mild chronic sinus disease.  IMPRESSION: Small foci of acute infarction in the right hemisphere supratentorial white matter.  No hemorrhage or mass effect. Chronic changes with atrophy, white matter disease, chronic hemorrhage and remote infarction as described.  Moderately large left subdural hygroma with mass effect on the frontal lobe, of indeterminate age.  MRA HEAD  Findings: The internal carotid arteries are widely patent.  The basilar artery is widely patent.  The left vertebral is slightly larger than the right, but both contribute to formation of the basilar.  Atretic right anterior cerebral artery, A1 segment.  No proximal MCA or PCA stenosis.  Both anterior cerebrals distally fed from the left.  No intracranial aneurysm.  IMPRESSION: No proximal vascular occlusion or significant stenosis.  Original Report Authenticated By: Elsie Stain, M.D.  PERTINENT LAB RESULTS: CBC:  Basename 12/05/11 2021 12/05/11 1514  WBC 7.8 6.7  HGB 11.9* 11.6*  HCT 35.4* 33.9*  PLT 174 175   CMET CMP     Component Value Date/Time   NA 141 12/05/2011 1514   K 4.6 12/05/2011 1514   CL 106 12/05/2011 1514   CO2 22 12/05/2011 1514   GLUCOSE 238* 12/05/2011 1514   BUN 30* 12/05/2011 1514   CREATININE 2.22* 12/05/2011 2021   CREATININE 2.48* 09/07/2010 0847   CALCIUM 9.5 12/05/2011 1514   PROT 7.4 10/24/2011 1017   ALBUMIN 4.0 10/24/2011 1017   AST 23 10/24/2011 1017   ALT 21 10/24/2011 1017   ALKPHOS 96 10/24/2011 1017   BILITOT 0.7 10/24/2011 1017   GFRNONAA 26* 12/05/2011 2021   GFRAA 30* 12/05/2011 2021    GFR Estimated Creatinine Clearance: 30.3 ml/min (by C-G formula based on Cr of 2.22). No results found for this basename: LIPASE:2,AMYLASE:2 in the last 72 hours  Basename 12/06/11 1325 12/06/11 0410 12/05/11  2022  CKTOTAL 197 160 167  CKMB 2.2 2.2 2.1  CKMBINDEX -- -- --  TROPONINI <0.30 <0.30 <0.30   No components found with this basename: POCBNP:3 No results found for this basename: DDIMER:2 in the last 72 hours  Basename 12/05/11 2021  HGBA1C 7.8*    Basename 12/06/11 0410  CHOL 120  HDL 24*  LDLCALC 48  TRIG 161*  CHOLHDL 5.0  LDLDIRECT --   No results found for this basename: TSH,T4TOTAL,FREET3,T3FREE,THYROIDAB in the last 72 hours No results found for this basename: VITAMINB12:2,FOLATE:2,FERRITIN:2,TIBC:2,IRON:2,RETICCTPCT:2 in the last 72 hours Coags: No results found for this basename: PT:2,INR:2 in the last 72 hours Microbiology: No results found for this or any previous visit (from the past 240 hour(s)).   BRIEF HOSPITAL COURSE:   1.Near syncope:  Unclear etiology. Cannot rule out hypoglycemic event as pt has hx of same. Given profound hx of vascular disease, MRI/MRA brain, carotid studies and 2Decho was done. MRI brain did reveal a small foci of acute infarction in the right hemisphere supratentorial white matter.Carotid dopplers did show Right ICA stenosis of 40 to 59%. No significant ICA stenosis on left.2 D echo was unremarkable.I am not sure whether the CVA was responsible for his presenting complaints, but in any event he will need aggressive risk factor modification.He has been switched from ASA to Plavix. Neurology was consulted as well.  2.Acute CVA -please see above  3.IDDM with hx of hypoglycemia:  Again, no hypoglycemic event was recorded. His sulfonylurea has been discontinued given his hx of low cbg's. Continue Lantus, SSI for now. A1C 7.8%   4. CKD:  At baseline   5. Hx CAD:  No cardiac complaints at present. EKG without evidence of ischemia. Cardiac enzymes negative x 3. Change to Plavix given CVA while on ASA  6. Dyslipidemia:  On statin prior to admit. FLP show TG 241. Will add Gemfibrozil. Outpatient follow-up with PCP.   7. Anemia, normocytic  (mild):  Chronic disease in setting of CKD. Monitor.  8. Subdural Hygroma - MRI Brain and clinical history d/w Dr Venetia Maxon from the Neurosurgical service, he advises no further work or intervention up regarding the sub-dural hygroma.     TODAY-DAY OF DISCHARGE:  Subjective:   Jodie Leiner today has no headache,no chest abdominal pain,no new weakness tingling or numbness, feels much better wants to go home today.   Objective:   Blood pressure 133/69, pulse 61, temperature 98 F (36.7 C), temperature source Oral,  resp. rate 18, height 6\' 2"  (1.88 m), weight 95.4 kg (210 lb 5.1 oz), SpO2 95.00%.  Intake/Output Summary (Last 24 hours) at 12/07/11 1709 Last data filed at 12/07/11 1300  Gross per 24 hour  Intake    480 ml  Output    300 ml  Net    180 ml    Exam Awake Alert, Oriented *3, No new F.N deficits, Normal affect [place].AT,PERRAL Supple Neck,No JVD, No cervical lymphadenopathy appriciated.  Symmetrical Chest wall movement, Good air movement bilaterally, CTAB RRR,No Gallops,Rubs or new Murmurs, No Parasternal Heave +ve B.Sounds, Abd Soft, Non tender, No organomegaly appriciated, No rebound -guarding or rigidity. No Cyanosis, Clubbing or edema, No new Rash or bruise  DISPOSITION: Home  DISCHARGE INSTRUCTIONS:    Follow-up Information    Follow up with Cassell Clement, MD. Schedule an appointment as soon as possible for a visit in 1 week.   Contact information:   [address], [address] [place] [address] [phone]       Follow up with Gates Rigg, MD. Schedule an appointment as soon as possible for a visit in 1 month.   Contact information:   [address] [facility] [place] [address] [phone]         Total Time spent on discharge equals 45 minutes.  SignedJeoffrey Massed 12/07/2011 5:09 PM

## 2011-12-09 DIAGNOSIS — I6340 Cerebral infarction due to embolism of unspecified cerebral artery: Secondary | ICD-10-CM

## 2011-12-16 ENCOUNTER — Other Ambulatory Visit: Payer: Self-pay | Admitting: Cardiology

## 2011-12-16 ENCOUNTER — Ambulatory Visit (INDEPENDENT_AMBULATORY_CARE_PROVIDER_SITE_OTHER): Payer: Medicare Other | Admitting: Nurse Practitioner

## 2011-12-16 ENCOUNTER — Encounter: Payer: Self-pay | Admitting: Nurse Practitioner

## 2011-12-16 VITALS — BP 186/84 | HR 72 | Ht 74.0 in | Wt 204.8 lb

## 2011-12-16 DIAGNOSIS — R55 Syncope and collapse: Secondary | ICD-10-CM

## 2011-12-16 MED ORDER — CEPHALEXIN 250 MG PO CAPS: 250.0000 mg | ORAL_CAPSULE | Freq: Four times a day (QID) | ORAL | Status: AC

## 2011-12-16 NOTE — Patient Instructions (Addendum)
We are going to place an event monitor to monitor your heart rhythm for the next month  We are going to arrange for a stress test (Lexiscan)  I am going to give you some antibiotics for your arm.   I will have you see Dr. Patty Sermons in about 5 weeks, but we will be in touch sooner if your studies are abnormal.  Call the [facility] Care office at [phone] if you have any questions, problems or concerns.

## 2011-12-16 NOTE — Progress Notes (Signed)
[name] Date of Birth: May 10, 1928 Medical Record #[id number]  History of Present Illness: [name] is seen today for a post hospital visit. He is seen for Dr. Patty Sermons. He has multiple medical issues which include remote CABG for CAD in 1997, cath in 2000, abnormal nuclear in 2007 and he has been managed medically. Other issues include DM, HTN, CKD and HLD.   He has most recently been hospitalized with syncope. He is here with his daughter and wife. He is not the best historian. He apparently passed out at the barber shop. He has had extensive evaluation due to his profound history of vascular disease.  MRI showed a small foci of acute infarction in the right hemisphere supratentorial white matter. Carotids showed a right stenosis of 40 to 59% stenosis. Nothing on the left.  Echo looks ok.  It was difficult to discern if this was due to his blood sugar/hypoglycemia. He notes that prior to this actual episode of syncope, he had had multiple spells of feeling "sick". He really can't elaborate. His daughter says she has heard him complain of getting dizzy and feeling like he was going to pass out and that this has been going on for several months. No actual chest pain. Sugars seem uncontrolled. He has taken no medicines today. No palpitations. He has been placed on Plavix.   Current Outpatient Prescriptions on File Prior to Visit  Medication Sig Dispense Refill  . amLODipine (NORVASC) 10 MG tablet Take 10 mg by mouth daily.      Marland Kitchen atorvastatin (LIPITOR) 40 MG tablet Take 40 mg by mouth daily.      . clopidogrel (PLAVIX) 75 MG tablet Take 1 tablet (75 mg total) by mouth daily with breakfast.  30 tablet  0  . gemfibrozil (LOPID) 600 MG tablet Take 1 tablet (600 mg total) by mouth 2 (two) times daily before a meal.  60 tablet  0  . insulin glargine (LANTUS) 100 UNIT/ML injection Inject 52 Units into the skin daily. As directed      . isosorbide mononitrate (IMDUR) 60 MG 24 hr tablet Take 60 mg by  mouth daily.      Marland Kitchen losartan (COZAAR) 50 MG tablet Take 50 mg by mouth daily.      . metoprolol (LOPRESSOR) 50 MG tablet Take 50 mg by mouth 2 (two) times daily.      Marland Kitchen omeprazole (PRILOSEC OTC) 20 MG tablet Take 20 mg by mouth daily.      Marland Kitchen DISCONTD: losartan (COZAAR) 50 MG tablet Take 1 tablet (50 mg total) by mouth daily.  90 tablet  3    No Known Allergies  Past Medical History  Diagnosis Date  . Coronary artery disease     post CABG in 1997 with abnormal nuclear stress test in 2007 which did demonstrate mild reversible ischemia and has been managed medically.   . Diabetes mellitus     type 2  . Hypertension   . Hyperlipidemia   . Hypercholesterolemia   . Atypical chest pain   . Acute on chronic renal insufficiency   . Hypernatremia   . History of hypoglycemia   . CKD (chronic kidney disease)   . Syncope   . CVA (cerebral infarction)     Past Surgical History  Procedure Date  . Coronary artery bypass graft 1997    x3 -- patent internal mammary graft to saphenous vein grafts x3  . Tonsillectomy   . Cardiac catheterization 01/26/1999    patent  internal mammary graft to saphenous vein grafts x3 -- singnificant three-vessel coroanry artery disease -- potential sites of ischemia involving the intermediate branch, circumflex branch and the distal LAD through diffuse disease, as well as steal type syndrome -- normal LV function -- W. Ashley Royalty., M.D.      History  Smoking status  . Never Smoker   Smokeless tobacco  . Never Used    History  Alcohol Use No    History reviewed. No pertinent family history.  Review of Systems: The review of systems is per the HPI.  All other systems were reviewed and are negative.  Physical Exam: BP 186/84  Pulse 72  Ht 6\' 2"  (1.88 m)  Wt 204 lb 12.8 oz (92.897 kg)  BMI 26.29 kg/m2 He has had no medicines today.  Patient is very pleasant and in no acute distress. Skin is warm and dry. Color is normal.  HEENT is  unremarkable. Normocephalic/atraumatic. PERRL. Sclera are nonicteric. Neck is supple. No masses. No JVD. Lungs are clear. Cardiac exam shows a regular rate and rhythm. Abdomen is soft. Extremities are without edema. The antecubital of the left arm has an area of infection with a pus pocket noted. It is red. Gait and ROM are intact. No gross neurologic deficits noted.  LABORATORY DATA: Lab Results  Component Value Date   WBC 7.8 12/05/2011   HGB 11.9* 12/05/2011   HCT 35.4* 12/05/2011   PLT 174 12/05/2011   GLUCOSE 238* 12/05/2011   CHOL 120 12/06/2011   TRIG 241* 12/06/2011   HDL 24* 12/06/2011   LDLDIRECT 50.1 06/06/2011   LDLCALC 48 12/06/2011   ALT 21 10/24/2011   AST 23 10/24/2011   NA 141 12/05/2011   K 4.6 12/05/2011   CL 106 12/05/2011   CREATININE 2.22* 12/05/2011   BUN 30* 12/05/2011   CO2 22 12/05/2011   HGBA1C 7.8* 12/05/2011     Assessment / Plan:

## 2011-12-16 NOTE — Assessment & Plan Note (Addendum)
Patient presents after a syncopal episode and hospitalization. Sounds like he has had lots of presyncope prior. Difficult to say what the etiology and he has so many issues that could be the culprit.  He has had extensive testing with his recent hospitalization but no stress testing or heart monitor. Will place an event monitor today and obtain a Lexiscan as well. For now, no change in his medicines. I have asked him to go home and take his medicines today. I will have him see Dr. Patty Sermons in follow up. I have also given him a course of antibiotics for this infection of his arm from phlebotomy. He is to let us know if it does not improve or if he has recurrent symptoms.  Patient is agreeable to this plan and will call if any problems develop in the interim.

## 2011-12-19 ENCOUNTER — Ambulatory Visit (HOSPITAL_COMMUNITY): Payer: Medicare Other | Attending: Nurse Practitioner | Admitting: Radiology

## 2011-12-19 VITALS — BP 133/68 | Ht 74.0 in | Wt 208.0 lb

## 2011-12-19 DIAGNOSIS — E785 Hyperlipidemia, unspecified: Secondary | ICD-10-CM | POA: Insufficient documentation

## 2011-12-19 DIAGNOSIS — R079 Chest pain, unspecified: Secondary | ICD-10-CM

## 2011-12-19 DIAGNOSIS — Z794 Long term (current) use of insulin: Secondary | ICD-10-CM | POA: Insufficient documentation

## 2011-12-19 DIAGNOSIS — Z951 Presence of aortocoronary bypass graft: Secondary | ICD-10-CM | POA: Insufficient documentation

## 2011-12-19 DIAGNOSIS — E119 Type 2 diabetes mellitus without complications: Secondary | ICD-10-CM

## 2011-12-19 DIAGNOSIS — I779 Disorder of arteries and arterioles, unspecified: Secondary | ICD-10-CM | POA: Insufficient documentation

## 2011-12-19 DIAGNOSIS — R42 Dizziness and giddiness: Secondary | ICD-10-CM | POA: Insufficient documentation

## 2011-12-19 DIAGNOSIS — E7800 Pure hypercholesterolemia, unspecified: Secondary | ICD-10-CM

## 2011-12-19 DIAGNOSIS — I2510 Atherosclerotic heart disease of native coronary artery without angina pectoris: Secondary | ICD-10-CM

## 2011-12-19 DIAGNOSIS — I2589 Other forms of chronic ischemic heart disease: Secondary | ICD-10-CM | POA: Insufficient documentation

## 2011-12-19 DIAGNOSIS — R55 Syncope and collapse: Secondary | ICD-10-CM | POA: Insufficient documentation

## 2011-12-19 DIAGNOSIS — I259 Chronic ischemic heart disease, unspecified: Secondary | ICD-10-CM

## 2011-12-19 DIAGNOSIS — I10 Essential (primary) hypertension: Secondary | ICD-10-CM | POA: Insufficient documentation

## 2011-12-19 DIAGNOSIS — Z8673 Personal history of transient ischemic attack (TIA), and cerebral infarction without residual deficits: Secondary | ICD-10-CM | POA: Insufficient documentation

## 2011-12-19 MED ORDER — REGADENOSON 0.4 MG/5ML IV SOLN
0.4000 mg | Freq: Once | INTRAVENOUS | Status: AC
  Administered 2011-12-19: 0.4 mg via INTRAVENOUS

## 2011-12-19 MED ORDER — TECHNETIUM TC 99M TETROFOSMIN IV KIT
10.0000 | PACK | Freq: Once | INTRAVENOUS | Status: AC | PRN
  Administered 2011-12-19: 10 via INTRAVENOUS

## 2011-12-19 MED ORDER — TECHNETIUM TC 99M TETROFOSMIN IV KIT
30.0000 | PACK | Freq: Once | INTRAVENOUS | Status: AC | PRN
  Administered 2011-12-19: 30 via INTRAVENOUS

## 2011-12-19 NOTE — Progress Notes (Signed)
[facility] SITE 3 NUCLEAR MED [address] [place] [address] [phone]  Cardiology Nuclear Med Study  [name] is a 78 y.o. male     MRN : [id number]     DOB: Feb 10, 1928  Procedure Date: 12/19/2011  Nuclear Med Background Indication for Stress Test:  Evaluation for Ischemia and Graft Patency History:  1997: CABG, 2000 Heart Cath: patent grafts significant 3V Dz,  2007: MPS: mild ant wall ischemia EF: 64% 12/05/11 Syncope (-) enzymes, 6/13 ECHO: EF: 55-60% Cardiac Risk Factors: Carotid Disease, CVA, Hypertension, IDDM Type 2 and Lipids  Symptoms:  Dizziness, Near Syncope and Syncope   Nuclear Pre-Procedure Caffeine/Decaff Intake:  None NPO After: 330 pm   Lungs:  clear O2 Sat: 95% on room air. IV 0.9% NS with Angio Cath:  20g  IV Site: R Antecubital  IV Started by:  Bonnita Levan, RN  Chest Size (in):  46 Cup Size: n/a  Height: 6\' 2"  (1.88 m)  Weight:  208 lb (94.348 kg)  BMI:  Body mass index is 26.71 kg/(m^2). Tech Comments:  BS @ 9:30 AM = 178    Nuclear Med Study 1 or 2 day study: 1 day  Stress Test Type:  Lexiscan  Reading MD: Marca Ancona, MD  Order Authorizing Provider:  Cassell Clement, MD  Resting Radionuclide: Technetium 51m Tetrofosmin  Resting Radionuclide Dose: 11.0 mCi   Stress Radionuclide:  Technetium 61m Tetrofosmin  Stress Radionuclide Dose: 32.8 mCi           Stress Protocol Rest HR: 68 Stress HR: 90  Rest BP: 133/68 Stress BP: 152/76  Exercise Time (min): n/a METS: n/a   Predicted Max HR: 139 bpm % Max HR: 64.75 bpm Rate Pressure Product: 96295   Dose of Adenosine (mg):  n/a Dose of Lexiscan: 0.4 mg  Dose of Atropine (mg): n/a Dose of Dobutamine: n/a mcg/kg/min (at max HR)  Stress Test Technologist: Milana Na, EMT-P  Nuclear Technologist:  Doyne Keel, CNMT     Rest Procedure:  Myocardial perfusion imaging was performed at rest 45 minutes following the intravenous administration of Technetium 81m  Tetrofosmin. Rest ECG: NSR - Normal EKG  Stress Procedure:  The patient received IV Lexiscan 0.4 mg over 15-seconds.  Technetium 24m Tetrofosmin injected at 30-seconds.  There were no significant changes, + sob, nausea, and abdominal pain with Lexiscan.  Quantitative spect images were obtained after a 45 minute delay. Stress ECG: No significant change from baseline ECG  QPS Raw Data Images:  Normal; no motion artifact; normal heart/lung ratio. Stress Images:  Small, mild basal to mid inferior perfusion defect.  Rest Images:  Small, mild basal inferior perfusion defect.  Subtraction (SDS):  Small, mild partially reversible basal to mid inferior perfusion defect.  Transient Ischemic Dilatation (Normal <1.22):  1.00 Lung/Heart Ratio (Normal <0.45):  0.39  Quantitative Gated Spect Images QGS EDV:  73 ml QGS ESV:  20 ml  Impression Exercise Capacity:  Lexiscan with no exercise. BP Response:  Hypotensive blood pressure response. Clinical Symptoms:  Dyspnea, nausea ECG Impression:  No significant ST segment change suggestive of ischemia. Comparison with Prior Nuclear Study: No images to compare  Overall Impression:  Low risk stress nuclear study.  Small, mild basal to mid inferior partially reversible perfusion defect suggests a small area of infarction with peri-infarct ischemia.   LV Ejection Fraction: 72%.  LV Wall Motion:  NL LV Function; NL Wall Motion  [employer]

## 2011-12-25 NOTE — Procedures (Signed)
EEG NUMBER:  REFERRING PHYSICIAN:  Dr. Laural Benes.  HISTORY:  An 78 year old male with near-syncope.  MEDICATIONS:  Norvasc, aspirin, Lipitor, heparin, Lantus, Imdur, Cozaar, Lopressor, Protonix, Prilosec.  CONDITIONS OF RECORDING:  This is a 16 channel EEG carried out with the patient in the awake and drowsy states.  DESCRIPTION:  The majority of the tracing consist of a mixture of delta and theta rhythms.  It is poorly organized in low-voltage.  Continuous during the tracing and is characteristic of drowse.  No stage sleep noted.  Despite the fact that the patient is able to answer questions throughout the tracing and open and close his eyes, the background activity still resembles that of drowse with a maximum posterior background rhythm noted to be 7 Hz theta.  Hyperventilation was not performed.  Intermittent photic stimulation failed to elicit any change in the tracing.  IMPRESSION:  This EEG is characterized by general background slowing. This finding is consistent with drowse, but cannot rule out the possibility of a diffuse disturbance that is etiologically nonspecific, but may include a metabolic encephalopathy among other possibilities.  No epileptiform activity was noted.          ______________________________ Thana Farr, MD    JY:NWGN D:  12/09/2011 19:47:48  T:  12/09/2011 21:19:29  Job #:  [id number]

## 2012-01-21 ENCOUNTER — Ambulatory Visit (INDEPENDENT_AMBULATORY_CARE_PROVIDER_SITE_OTHER): Payer: Medicare Other | Admitting: Cardiology

## 2012-01-21 ENCOUNTER — Encounter: Payer: Self-pay | Admitting: Cardiology

## 2012-01-21 VITALS — BP 166/76 | HR 76 | Ht 74.0 in | Wt 204.0 lb

## 2012-01-21 DIAGNOSIS — I259 Chronic ischemic heart disease, unspecified: Secondary | ICD-10-CM

## 2012-01-21 DIAGNOSIS — R55 Syncope and collapse: Secondary | ICD-10-CM

## 2012-01-21 DIAGNOSIS — I119 Hypertensive heart disease without heart failure: Secondary | ICD-10-CM

## 2012-01-21 DIAGNOSIS — Z794 Long term (current) use of insulin: Secondary | ICD-10-CM

## 2012-01-21 DIAGNOSIS — E119 Type 2 diabetes mellitus without complications: Secondary | ICD-10-CM

## 2012-01-21 DIAGNOSIS — I2510 Atherosclerotic heart disease of native coronary artery without angina pectoris: Secondary | ICD-10-CM

## 2012-01-21 LAB — BASIC METABOLIC PANEL
BUN: 23 mg/dL (ref 6–23)
Calcium: 9.2 mg/dL (ref 8.4–10.5)
Creatinine, Ser: 2.1 mg/dL — ABNORMAL HIGH (ref 0.4–1.5)
GFR: 39.08 mL/min — ABNORMAL LOW (ref >60.00)
Glucose, Bld: 145 mg/dL — ABNORMAL HIGH (ref 70–99)

## 2012-01-21 NOTE — Assessment & Plan Note (Signed)
No further symptoms of dizziness or syncope.  No hypoglycemic symptoms.  He never did wear the event monitor that we had talked to him about.

## 2012-01-21 NOTE — Progress Notes (Signed)
[name] Date of Birth:  Oct 05, 1927 [facility] [address] [place], [place]  [address] [phone]         Fax   [phone]  History of Present Illness: This pleasant 78 year old [place] American gentleman is seen for a scheduled followup office visit.  He has a history of multiple medical problems which include remote CABG in 1997 and a cardiac catheter in 2000 and a mildly abnormal nuclear stress test in 2007.  He also has hypertension chronic kidney disease hyperlipidemia and diabetes mellitus.  He was recently hospitalized at [facility] with syncope.  He had a very thorough workup.  It apparently was felt that his symptoms were secondary to hypoglycemia and he was taken off his glimepiride.  Current Outpatient Prescriptions  Medication Sig Dispense Refill  . amLODipine (NORVASC) 10 MG tablet Take 10 mg by mouth daily.      Marland Kitchen atorvastatin (LIPITOR) 40 MG tablet Take 40 mg by mouth daily.      . clopidogrel (PLAVIX) 75 MG tablet Take 1 tablet (75 mg total) by mouth daily with breakfast.  30 tablet  0  . gemfibrozil (LOPID) 600 MG tablet Take 1 tablet (600 mg total) by mouth 2 (two) times daily before a meal.  60 tablet  0  . insulin glargine (LANTUS) 100 UNIT/ML injection Inject 52 Units into the skin daily. As directed      . isosorbide mononitrate (IMDUR) 60 MG 24 hr tablet Take 60 mg by mouth daily.      Marland Kitchen losartan (COZAAR) 50 MG tablet Take 50 mg by mouth daily.      . metoprolol (LOPRESSOR) 50 MG tablet Take 50 mg by mouth 2 (two) times daily.      . metoprolol (LOPRESSOR) 50 MG tablet TAKE 1 TABLET BY MOUTH TWICE DAILY  180 tablet  1  . omeprazole (PRILOSEC OTC) 20 MG tablet Take 20 mg by mouth daily.      Marland Kitchen DISCONTD: losartan (COZAAR) 50 MG tablet Take 1 tablet (50 mg total) by mouth daily.  90 tablet  3    No Known Allergies  Patient Active Problem List  Diagnosis  . Ischemic heart disease  . Benign hypertensive heart disease without  heart failure  . Diabetes mellitus type 2, insulin dependent  . Hypercholesterolemia  . Near syncope  . Dehydration  . Hyperglycemia  . History of hypoglycemia  . Altered mental state  . Frequency of urination and polyuria  . Chest pain  . CKD (chronic kidney disease)  . Syncope    History  Smoking status  . Never Smoker   Smokeless tobacco  . Never Used    History  Alcohol Use No    No family history on file.  Review of Systems: Constitutional: no fever chills diaphoresis or fatigue or change in weight.  Head and neck: no hearing loss, no epistaxis, no photophobia or visual disturbance. Respiratory: No cough, shortness of breath or wheezing. Cardiovascular: No chest pain peripheral edema, palpitations. Gastrointestinal: No abdominal distention, no abdominal pain, no change in bowel habits hematochezia or melena. Genitourinary: No dysuria, no frequency, no urgency, no nocturia. Musculoskeletal:No arthralgias, no back pain, no gait disturbance or myalgias. Neurological: No dizziness, no headaches, no numbness, no seizures, no syncope, no weakness, no tremors. Hematologic: No lymphadenopathy, no easy bruising. Psychiatric: No confusion, no hallucinations, no sleep disturbance.    Physical Exam: Filed Vitals:   01/21/12 1019  BP: 166/76  Pulse: 76  the general appearance reveals a well-developed well-nourished elderly gentleman in no distress.The head and neck exam reveals pupils equal and reactive.  Extraocular movements are full.  There is no scleral icterus.  The mouth and pharynx are normal.  The neck is supple.  The carotids reveal no bruits.  The jugular venous pressure is normal.  The  thyroid is not enlarged.  There is no lymphadenopathy.  The chest is clear to percussion and auscultation.  There are no rales or rhonchi.  Expansion of the chest is symmetrical.  The precordium is quiet.  The first heart sound is normal.  The second heart sound is physiologically  split.  There is no murmur gallop rub or click.  There is no abnormal lift or heave.  The abdomen is soft and nontender.  The bowel sounds are normal.  The liver and spleen are not enlarged.  There are no abdominal masses.  There are no abdominal bruits.  Extremities reveal good pedal pulses.  There is no phlebitis or edema.  There is no cyanosis or clubbing.  Strength is normal and symmetrical in all extremities.  There is no lateralizing weakness.  There are no sensory deficits.  The skin is warm and dry.  There is no rash.     Assessment / Plan: Continue present medications.  Recheck in 3 months for followup office visit EKG lipid panel hepatic function panel nasal metabolic panel and hemoglobin Z6X

## 2012-01-21 NOTE — Patient Instructions (Addendum)
Your physician recommends that you have lab work today: BMP.  We will call you with your results.   Your physician recommends that you continue on your current medications as directed. Please refer to the Current Medication list given to you today.  Your physician recommends that you schedule a follow-up appointment in: 3 months with Dr. Patty Sermons  Your physician recommends that you return for lab work in: 3 months for fasting lab work: lipid,liver,bmp, HbA1C

## 2012-01-21 NOTE — Progress Notes (Signed)
Quick Note:  Please report to patient. The recent labs are stable. Continue same medication and careful diet. ______ 

## 2012-01-21 NOTE — Assessment & Plan Note (Signed)
Blood sugars have been stable at home.  His wife brought in his list of daily recordings of blood pressure and pulse which have been stable as has his blood sugar.  Presently he is on insulin alone in the form of Lantus insulin but no longer takes any oral agents

## 2012-01-21 NOTE — Assessment & Plan Note (Signed)
The patient has had no recurrent chest pain or angina 

## 2012-01-22 ENCOUNTER — Telehealth: Payer: Self-pay | Admitting: *Deleted

## 2012-01-22 NOTE — Telephone Encounter (Signed)
Message copied by Burnell Blanks on Wed Jan 22, 2012 11:35 AM ------      Message from: Cassell Clement      Created: Tue Jan 21, 2012  4:27 PM       Please report to patient.  The recent labs are stable. Continue same medication and careful diet.

## 2012-01-22 NOTE — Telephone Encounter (Signed)
Advised patient of lab results  

## 2012-02-24 ENCOUNTER — Other Ambulatory Visit: Payer: Medicare Other

## 2012-02-24 ENCOUNTER — Ambulatory Visit: Payer: Medicare Other | Admitting: Cardiology

## 2012-03-16 ENCOUNTER — Other Ambulatory Visit: Payer: Self-pay | Admitting: *Deleted

## 2012-03-16 MED ORDER — INSULIN GLARGINE 100 UNIT/ML ~~LOC~~ SOLN: 52.0000 [IU] | Freq: Every day | SUBCUTANEOUS | Status: DC

## 2012-03-16 NOTE — Telephone Encounter (Signed)
Refilled lantus

## 2012-04-22 ENCOUNTER — Telehealth: Payer: Self-pay | Admitting: Cardiology

## 2012-04-22 MED ORDER — INSULIN GLARGINE 100 UNIT/ML ~~LOC~~ SOLN: SUBCUTANEOUS | Status: DC

## 2012-04-22 NOTE — Telephone Encounter (Signed)
Spoke with patient and he did not know Chief Strategy Officer and wanted to continue to get refills at [facility]

## 2012-04-22 NOTE — Telephone Encounter (Signed)
New Problem:    Called in needing a refill of the patient's insulin glargine (LANTUS) 100 UNIT/ML injection filled at [facility].

## 2012-04-28 ENCOUNTER — Encounter: Payer: Self-pay | Admitting: Cardiology

## 2012-04-28 ENCOUNTER — Ambulatory Visit: Payer: Medicare Other | Admitting: Cardiology

## 2012-04-28 ENCOUNTER — Ambulatory Visit (INDEPENDENT_AMBULATORY_CARE_PROVIDER_SITE_OTHER): Payer: Medicare Other | Admitting: Cardiology

## 2012-04-28 VITALS — BP 128/66 | HR 72 | Resp 18 | Ht 73.0 in | Wt 195.8 lb

## 2012-04-28 DIAGNOSIS — R55 Syncope and collapse: Secondary | ICD-10-CM

## 2012-04-28 DIAGNOSIS — I119 Hypertensive heart disease without heart failure: Secondary | ICD-10-CM

## 2012-04-28 DIAGNOSIS — IMO0001 Reserved for inherently not codable concepts without codable children: Secondary | ICD-10-CM

## 2012-04-28 DIAGNOSIS — E7800 Pure hypercholesterolemia, unspecified: Secondary | ICD-10-CM

## 2012-04-28 DIAGNOSIS — Z794 Long term (current) use of insulin: Secondary | ICD-10-CM

## 2012-04-28 DIAGNOSIS — I259 Chronic ischemic heart disease, unspecified: Secondary | ICD-10-CM

## 2012-04-28 DIAGNOSIS — I2510 Atherosclerotic heart disease of native coronary artery without angina pectoris: Secondary | ICD-10-CM

## 2012-04-28 DIAGNOSIS — E119 Type 2 diabetes mellitus without complications: Secondary | ICD-10-CM

## 2012-04-28 DIAGNOSIS — M791 Myalgia, unspecified site: Secondary | ICD-10-CM

## 2012-04-28 DIAGNOSIS — R079 Chest pain, unspecified: Secondary | ICD-10-CM

## 2012-04-28 LAB — LIPID PANEL
Cholesterol: 129 mg/dL (ref 0–200)
LDL Cholesterol: 61 mg/dL (ref 0–99)

## 2012-04-28 LAB — BASIC METABOLIC PANEL
BUN: 29 mg/dL — ABNORMAL HIGH (ref 6–23)
Chloride: 110 mEq/L (ref 96–112)
Glucose, Bld: 258 mg/dL — ABNORMAL HIGH (ref 70–99)
Potassium: 3.7 mEq/L (ref 3.5–5.1)

## 2012-04-28 LAB — HEPATIC FUNCTION PANEL
ALT: 22 U/L (ref 0–53)
AST: 17 U/L (ref 0–37)
Total Bilirubin: 1 mg/dL (ref 0.3–1.2)

## 2012-04-28 NOTE — Patient Instructions (Addendum)
Your physician recommends that you schedule a follow-up appointment in: 4 months with fasting labs (lp/bmet/hfp, A1C, CBC)    Your physician has recommended you make the following change in your medication:   STOP Gemfibrozil  Will obtain labs today and call you with the results Total CK,lp,bmet,hfp,a1c

## 2012-04-28 NOTE — Assessment & Plan Note (Signed)
The patient has been on both gemfibrozil and 40 mg of Lipitor.  He is complaining of myalgias and aching in his arms and legs.  We will stop the gemfibrozil and continue with the Lipitor.  Work today is pending and we will also get a total CK level

## 2012-04-28 NOTE — Assessment & Plan Note (Signed)
The patient has not been experiencing any recurrent chest pain or angina.  He had coronary artery bypass graft surgery in 1997.

## 2012-04-28 NOTE — Progress Notes (Signed)
[name] Date of Birth:  Feb 27, 1928  [facility] [address] [place], [place]  [address] [phone]         Fax   [phone]  History of Present Illness: This pleasant 78 year old [place] American gentleman is seen for a scheduled followup office visit. He has a history of multiple medical problems which include remote CABG in 1997 and a cardiac catheter in 2000 and a mildly abnormal nuclear stress test in 2007. He also has hypertension chronic kidney disease hyperlipidemia and diabetes mellitus. He was recently hospitalized at [facility] with syncope. He had a very thorough workup. It apparently was felt that his symptoms were secondary to hypoglycemia and he was taken off his glimepiride.   Current Outpatient Prescriptions  Medication Sig Dispense Refill  . amLODipine (NORVASC) 10 MG tablet Take 10 mg by mouth daily.      Marland Kitchen atorvastatin (LIPITOR) 40 MG tablet Take 40 mg by mouth daily.      . insulin glargine (LANTUS) 100 UNIT/ML injection As directed  10 mL  5  . isosorbide mononitrate (IMDUR) 60 MG 24 hr tablet Take 60 mg by mouth daily.      Marland Kitchen losartan (COZAAR) 50 MG tablet Take 50 mg by mouth daily.      . metoprolol (LOPRESSOR) 50 MG tablet TAKE 1 TABLET BY MOUTH TWICE DAILY  180 tablet  1  . omeprazole (PRILOSEC OTC) 20 MG tablet Take 20 mg by mouth daily.      . clopidogrel (PLAVIX) 75 MG tablet Take 1 tablet (75 mg total) by mouth daily with breakfast.  30 tablet  0  . metoprolol (LOPRESSOR) 50 MG tablet Take 50 mg by mouth 2 (two) times daily.      Marland Kitchen DISCONTD: losartan (COZAAR) 50 MG tablet Take 1 tablet (50 mg total) by mouth daily.  90 tablet  3    No Known Allergies  Patient Active Problem List  Diagnosis  . Ischemic heart disease  . Benign hypertensive heart disease without heart failure  . Diabetes mellitus type 2, insulin dependent  . Hypercholesterolemia  . Near syncope  . Dehydration  . Hyperglycemia  . History of  hypoglycemia  . Altered mental state  . Frequency of urination and polyuria  . Chest pain  . CKD (chronic kidney disease)  . Syncope    History  Smoking status  . Never Smoker   Smokeless tobacco  . Never Used    History  Alcohol Use No    No family history on file.  Review of Systems: Constitutional: no fever chills diaphoresis or fatigue or change in weight.  Head and neck: no hearing loss, no epistaxis, no photophobia or visual disturbance. Respiratory: No cough, shortness of breath or wheezing. Cardiovascular: No chest pain peripheral edema, palpitations. Gastrointestinal: No abdominal distention, no abdominal pain, no change in bowel habits hematochezia or melena. Genitourinary: No dysuria, no frequency, no urgency, no nocturia. Musculoskeletal:No arthralgias, no back pain, no gait disturbance or myalgias. Neurological: No dizziness, no headaches, no numbness, no seizures, no syncope, no weakness, no tremors. Hematologic: No lymphadenopathy, no easy bruising. Psychiatric: No confusion, no hallucinations, no sleep disturbance.    Physical Exam: Filed Vitals:   04/28/12 0913  BP: 128/66  Pulse: 72  Resp: 18   the general appearance reveals a well-developed well-nourished gentleman in no distress.The head and neck exam reveals pupils equal and reactive.  Extraocular movements are full.  There is no scleral icterus.  The mouth  and pharynx are normal.  The neck is supple.  The carotids reveal no bruits.  The jugular venous pressure is normal.  The  thyroid is not enlarged.  There is no lymphadenopathy.  The chest is clear to percussion and auscultation.  There are no rales or rhonchi.  Expansion of the chest is symmetrical.  The precordium is quiet.  The first heart sound is normal.  The second heart sound is physiologically split.  There is no murmur gallop rub or click.  There is no abnormal lift or heave.  The abdomen is soft and nontender.  The bowel sounds are normal.   The liver and spleen are not enlarged.  There are no abdominal masses.  There are no abdominal bruits.  Extremities reveal good pedal pulses.  There is no phlebitis or edema.  There is no cyanosis or clubbing.  Strength is normal and symmetrical in all extremities.  There is no lateralizing weakness.  There are no sensory deficits.  The skin is warm and dry.  There is no rash.  EKG shows normal sinus rhythm and nonspecific T-wave abnormality.   Assessment / Plan: Continue present medication except stop gemfibrozil.  Recheck in 4 months for followup office visit CBC hemoglobin A1c lipid panel hepatic function panel and basal metabolic panel

## 2012-04-28 NOTE — Assessment & Plan Note (Signed)
The patient has not been having any hypoglycemic episodes 

## 2012-04-28 NOTE — Assessment & Plan Note (Signed)
The patient has not had any further dizzy spells or syncope

## 2012-05-01 ENCOUNTER — Telehealth: Payer: Self-pay | Admitting: *Deleted

## 2012-05-01 NOTE — Telephone Encounter (Signed)
Message copied by Burnell Blanks on Fri May 01, 2012  5:22 PM ------      Message from: Cassell Clement      Created: Fri May 01, 2012  3:33 PM       Patient should increase his Lantus up to 56 units daily.

## 2012-05-01 NOTE — Telephone Encounter (Signed)
Advised of labs and med chage

## 2012-05-29 ENCOUNTER — Other Ambulatory Visit: Payer: Self-pay

## 2012-05-29 ENCOUNTER — Telehealth: Payer: Self-pay | Admitting: Cardiology

## 2012-05-29 MED ORDER — OMEPRAZOLE MAGNESIUM 20 MG PO TBEC: 20.0000 mg | DELAYED_RELEASE_TABLET | Freq: Every day | ORAL | Status: DC

## 2012-05-29 NOTE — Telephone Encounter (Signed)
Spoke with pt, she was asking about the pts metformin. According to the last office note he is no longer taking metformin just insulin. Pt dtr voiced understanding.

## 2012-05-29 NOTE — Telephone Encounter (Signed)
The daughter is fixing his medications and needs help with what goes in the box because she has not done this for a few years

## 2012-06-08 ENCOUNTER — Ambulatory Visit: Payer: Medicare Other

## 2012-06-08 ENCOUNTER — Ambulatory Visit (INDEPENDENT_AMBULATORY_CARE_PROVIDER_SITE_OTHER): Payer: Medicare Other | Admitting: Emergency Medicine

## 2012-06-08 VITALS — BP 161/77 | HR 110 | Temp 97.7°F | Resp 20 | Ht 72.0 in | Wt 184.0 lb

## 2012-06-08 DIAGNOSIS — K5900 Constipation, unspecified: Secondary | ICD-10-CM

## 2012-06-08 DIAGNOSIS — H269 Unspecified cataract: Secondary | ICD-10-CM

## 2012-06-08 DIAGNOSIS — R109 Unspecified abdominal pain: Secondary | ICD-10-CM

## 2012-06-08 DIAGNOSIS — E119 Type 2 diabetes mellitus without complications: Secondary | ICD-10-CM

## 2012-06-08 LAB — POCT CBC
HCT, POC: 42.4 % — AB (ref 43.5–53.7)
Lymph, poc: 2 (ref 0.6–3.4)
MCH, POC: 29 pg (ref 27–31.2)
MCHC: 30.4 g/dL — AB (ref 31.8–35.4)
MCV: 95.2 fL (ref 80–97)
POC Granulocyte: 4.8 (ref 2–6.9)
POC LYMPH PERCENT: 27.5 %L (ref 10–50)
RDW, POC: 12.5 %

## 2012-06-08 LAB — POCT UA - MICROSCOPIC ONLY
Casts, Ur, LPF, POC: NEGATIVE
Mucus, UA: NEGATIVE

## 2012-06-08 LAB — POCT URINALYSIS DIPSTICK
Bilirubin, UA: NEGATIVE
Glucose, UA: 500
Spec Grav, UA: 1.01
Urobilinogen, UA: 0.2

## 2012-06-08 LAB — COMPREHENSIVE METABOLIC PANEL
Albumin: 4.1 g/dL (ref 3.5–5.2)
BUN: 26 mg/dL — ABNORMAL HIGH (ref 6–23)
Calcium: 9.4 mg/dL (ref 8.4–10.5)
Chloride: 104 mEq/L (ref 96–112)
Glucose, Bld: 408 mg/dL — ABNORMAL HIGH (ref 70–99)
Potassium: 4.6 mEq/L (ref 3.5–5.3)

## 2012-06-08 LAB — TSH: TSH: 1.964 u[IU]/mL (ref 0.350–4.500)

## 2012-06-08 LAB — POCT GLYCOSYLATED HEMOGLOBIN (HGB A1C): Hemoglobin A1C: 11.4

## 2012-06-08 MED ORDER — METFORMIN HCL 1000 MG PO TABS: 1000.0000 mg | ORAL_TABLET | Freq: Two times a day (BID) | ORAL | Status: DC

## 2012-06-08 NOTE — Progress Notes (Signed)
[facility] [address] [place] [address] [phone]  Date:  06/08/2012   Name:  [name]   DOB:  22-Jan-1928   MRN:  [id number]  PCP:  Cassell Clement, MD    Chief Complaint: Abdominal Pain and Otalgia   History of Present Illness:  [name] is a 78 y.o. very pleasant male patient who presents with the following:  Long history of vague generalized abdominal pain that seems to center on persistent constipation.  No weight loss, blood in stool, black stools, nausea or vomiting.  No vomiting blood. No fever or chills.  No food intolerance.  No provocative factors.  Nothing eases pain.   Patient Active Problem List  Diagnosis  . Ischemic heart disease  . Benign hypertensive heart disease without heart failure  . Diabetes mellitus type 2, insulin dependent  . Hypercholesterolemia  . Near syncope  . Dehydration  . Hyperglycemia  . History of hypoglycemia  . Altered mental state  . Frequency of urination and polyuria  . Chest pain  . CKD (chronic kidney disease)  . Syncope    Past Medical History  Diagnosis Date  . Coronary artery disease     post CABG in 1997 with abnormal nuclear stress test in 2007 which did demonstrate mild reversible ischemia and has been managed medically.   . Diabetes mellitus     type 2  . Hypertension   . Hyperlipidemia   . Hypercholesterolemia   . Atypical chest pain   . Acute on chronic renal insufficiency   . Hypernatremia   . History of hypoglycemia   . CKD (chronic kidney disease)   . Syncope   . CVA (cerebral infarction)     Past Surgical History  Procedure Date  . Coronary artery bypass graft 1997    x3 -- patent internal mammary graft to saphenous vein grafts x3  . Tonsillectomy   . Cardiac catheterization 01/26/1999    patent internal mammary graft to saphenous vein grafts x3 -- singnificant three-vessel coroanry artery disease -- potential sites of ischemia involving the intermediate branch,  circumflex branch and the distal LAD through diffuse disease, as well as steal type syndrome -- normal LV function -- W. Ashley Royalty., M.D.      History  Substance Use Topics  . Smoking status: Never Smoker   . Smokeless tobacco: Never Used  . Alcohol Use: No    History reviewed. No pertinent family history.  No Known Allergies  Medication list has been reviewed and updated.  Current Outpatient Prescriptions on File Prior to Visit  Medication Sig Dispense Refill  . amLODipine (NORVASC) 10 MG tablet Take 10 mg by mouth daily.      Marland Kitchen atorvastatin (LIPITOR) 40 MG tablet Take 40 mg by mouth daily.      . insulin glargine (LANTUS) 100 UNIT/ML injection 56 units daily or As directed      . isosorbide mononitrate (IMDUR) 60 MG 24 hr tablet Take 60 mg by mouth daily.      Marland Kitchen losartan (COZAAR) 50 MG tablet Take 50 mg by mouth daily.      . metoprolol (LOPRESSOR) 50 MG tablet TAKE 1 TABLET BY MOUTH TWICE DAILY  180 tablet  1  . omeprazole (PRILOSEC OTC) 20 MG tablet Take 1 tablet (20 mg total) by mouth daily.  90 tablet  3  . clopidogrel (PLAVIX) 75 MG tablet Take 1 tablet (75 mg total) by mouth daily with breakfast.  30  tablet  0  . metoprolol (LOPRESSOR) 50 MG tablet Take 50 mg by mouth 2 (two) times daily.      . [DISCONTINUED] losartan (COZAAR) 50 MG tablet Take 1 tablet (50 mg total) by mouth daily.  90 tablet  3    Review of Systems:  As per HPI, otherwise negative.    Physical Examination: Filed Vitals:   06/08/12 1407  BP: 161/77  Pulse: 110  Temp: 97.7 F (36.5 C)  Resp: 20   Filed Vitals:   06/08/12 1407  Height: 6' (1.829 m)  Weight: 184 lb (83.462 kg)   Body mass index is 24.95 kg/(m^2). Ideal Body Weight: Weight in (lb) to have BMI = 25: 183.9   GEN: WDWN, NAD, Non-toxic, A & O x 3 HEENT: Atraumatic, Normocephalic. Neck supple. No masses, No LAD. Ears and Nose: No external deformity. CV: RRR, No M/G/R. No JVD. No thrill. No extra heart sounds. PULM:  CTA B, no wheezes, crackles, rhonchi. No retractions. No resp. distress. No accessory muscle use. ABD: S, NT, ND, +BS. No rebound. No HSM. EXTR: No c/c/e NEURO Normal gait.  PSYCH: Normally interactive. Conversant. Not depressed or anxious appearing.  Calm demeanor.    Assessment and Plan: abominal pain HPB NIDDM Labs Renew metformin Refer to 104  Dareen Piano Tessa Lerner, MD  [facility] reading (PRIMARY) by  Dr. Dareen Piano.  Increased stool.  CABG.  No free air or air fluid levels  Results for orders placed in visit on 06/08/12  POCT CBC      Component Value Range   WBC 7.4  4.6 - 10.2 K/uL   Lymph, poc 2.0  0.6 - 3.4   POC LYMPH PERCENT 27.5  10 - 50 %L   MID (cbc) 0.5  0 - 0.9   POC MID % 7.2  0 - 12 %M   POC Granulocyte 4.8  2 - 6.9   Granulocyte percent 65.3  37 - 80 %G   RBC 4.45 (*) 4.69 - 6.13 M/uL   Hemoglobin 12.9 (*) 14.1 - 18.1 g/dL   HCT, POC 40.9 (*) 81.1 - 53.7 %   MCV 95.2  80 - 97 fL   MCH, POC 29.0  27 - 31.2 pg   MCHC 30.4 (*) 31.8 - 35.4 g/dL   RDW, POC 91.4     Platelet Count, POC 225  142 - 424 K/uL   MPV 9.3  0 - 99.8 fL  POCT GLYCOSYLATED HEMOGLOBIN (HGB A1C)      Component Value Range   Hemoglobin A1C 11.4    POCT UA - MICROSCOPIC ONLY      Component Value Range   WBC, Ur, HPF, POC 0-2     RBC, urine, microscopic 0-1     Bacteria, U Microscopic 1+     Mucus, UA neg     Epithelial cells, urine per micros 0-1     Crystals, Ur, HPF, POC neg     Casts, Ur, LPF, POC neg     Yeast, UA neg    POCT URINALYSIS DIPSTICK      Component Value Range   Color, UA yellow     Clarity, UA clear     Glucose, UA 500     Bilirubin, UA neg     Ketones, UA neg     Spec Grav, UA 1.010     Blood, UA small     pH, UA 5.5     Protein, UA 100     Urobilinogen, UA  0.2     Nitrite, UA neg     Leukocytes, UA Negative

## 2012-06-12 ENCOUNTER — Other Ambulatory Visit: Payer: Self-pay

## 2012-06-17 ENCOUNTER — Ambulatory Visit (INDEPENDENT_AMBULATORY_CARE_PROVIDER_SITE_OTHER): Payer: Medicare Other | Admitting: Family Medicine

## 2012-06-17 VITALS — BP 146/76 | HR 84 | Temp 97.6°F | Resp 16 | Ht 71.5 in | Wt 185.4 lb

## 2012-06-17 DIAGNOSIS — E119 Type 2 diabetes mellitus without complications: Secondary | ICD-10-CM

## 2012-06-17 DIAGNOSIS — D649 Anemia, unspecified: Secondary | ICD-10-CM

## 2012-06-17 DIAGNOSIS — R109 Unspecified abdominal pain: Secondary | ICD-10-CM

## 2012-06-17 LAB — GLUCOSE, POCT (MANUAL RESULT ENTRY): POC Glucose: 199 mg/dl — AB (ref 70–99)

## 2012-06-17 LAB — POCT CBC
HCT, POC: 36.1 % — AB (ref 43.5–53.7)
Hemoglobin: 11 g/dL — AB (ref 14.1–18.1)
Lymph, poc: 2.3 (ref 0.6–3.4)
MCH, POC: 28.8 pg (ref 27–31.2)
MCHC: 30.5 g/dL — AB (ref 31.8–35.4)
WBC: 7.6 10*3/uL (ref 4.6–10.2)

## 2012-06-17 NOTE — Progress Notes (Signed)
[facility] [address], [place] [address] [phone]  Date:  06/17/2012   Name:  [name]   DOB:  12-03-1927   MRN:  [id number]  PCP:  Cassell Clement, MD    Chief Complaint: Annual Exam   History of Present Illness:  [name] is a 78 y.o. very pleasant male patient who presents with the following:  This 78 year old gentleman with multiple medical problems presents for a CPE today.  He has been seen here once for abdominal pain but does not have a PCP here yet.     He is a pt of Dr. Patty Sermons- HPI for his last visit at cardiology in October: History of Present Illness:  This pleasant 78 year old [place] American gentleman is seen for a scheduled followup office visit. He has a history of multiple medical problems which include remote CABG in 1997 and a cardiac catheter in 2000 and a mildly abnormal nuclear stress test in 2007. He also has hypertension chronic kidney disease hyperlipidemia and diabetes mellitus. He was recently hospitalized at [facility] with syncope. He had a very thorough workup. It apparently was felt that his symptoms were secondary to hypoglycemia and he was taken off his glimepiride.   He was here about 10 days ago due to abdominal pain of long duration which was thought to be due to constipation.  Uncontrolled DM- last A1c was 11.4 a few days ago.  At that visit we restarted his metformin  He has had the abdominal pain for about 6 months.  It seems to come and go without any definite pattern.  He usually just sees Dr. Patty Sermons- does not have a family doctor or a nephrologist.    He has had his pneumovax- 6/13.  Refused a flu shot today.    Patient Active Problem List  Diagnosis  . Ischemic heart disease  . Benign hypertensive heart disease without heart failure  . Diabetes mellitus type 2, insulin dependent  . Hypercholesterolemia  . Near syncope  . Dehydration  . Hyperglycemia  . History of hypoglycemia  .  Altered mental state  . Frequency of urination and polyuria  . Chest pain  . CKD (chronic kidney disease)  . Syncope    Past Medical History  Diagnosis Date  . Coronary artery disease     post CABG in 1997 with abnormal nuclear stress test in 2007 which did demonstrate mild reversible ischemia and has been managed medically.   . Diabetes mellitus     type 2  . Hypertension   . Hyperlipidemia   . Hypercholesterolemia   . Atypical chest pain   . Acute on chronic renal insufficiency   . Hypernatremia   . History of hypoglycemia   . CKD (chronic kidney disease)   . Syncope   . CVA (cerebral infarction)   . Allergy   . Asthma     Past Surgical History  Procedure Date  . Coronary artery bypass graft 1997    x3 -- patent internal mammary graft to saphenous vein grafts x3  . Tonsillectomy   . Cardiac catheterization 01/26/1999    patent internal mammary graft to saphenous vein grafts x3 -- singnificant three-vessel coroanry artery disease -- potential sites of ischemia involving the intermediate branch, circumflex branch and the distal LAD through diffuse disease, as well as steal type syndrome -- normal LV function -- W. Ashley Royalty., M.D.    . Hernia repair     History  Substance Use Topics  . Smoking status: Never Smoker   . Smokeless tobacco: Never Used  . Alcohol Use: No    No family history on file.  No Known Allergies  Medication list has been reviewed and updated.  Current Outpatient Prescriptions on File Prior to Visit  Medication Sig Dispense Refill  . amLODipine (NORVASC) 10 MG tablet Take 10 mg by mouth daily.      Marland Kitchen atorvastatin (LIPITOR) 40 MG tablet Take 40 mg by mouth daily.      . insulin glargine (LANTUS) 100 UNIT/ML injection 56 units daily or As directed      . isosorbide mononitrate (IMDUR) 60 MG 24 hr tablet Take 60 mg by mouth daily.      Marland Kitchen losartan (COZAAR) 50 MG tablet Take 50 mg by mouth daily.      . metFORMIN (GLUCOPHAGE) 1000 MG  tablet Take 1 tablet (1,000 mg total) by mouth 2 (two) times daily with a meal.  180 tablet  3  . metoprolol (LOPRESSOR) 50 MG tablet Take 50 mg by mouth 2 (two) times daily.      Marland Kitchen omeprazole (PRILOSEC OTC) 20 MG tablet Take 1 tablet (20 mg total) by mouth daily.  90 tablet  3  . clopidogrel (PLAVIX) 75 MG tablet Take 1 tablet (75 mg total) by mouth daily with breakfast.  30 tablet  0  . metoprolol (LOPRESSOR) 50 MG tablet TAKE 1 TABLET BY MOUTH TWICE DAILY  180 tablet  1  . [DISCONTINUED] losartan (COZAAR) 50 MG tablet Take 1 tablet (50 mg total) by mouth daily.  90 tablet  3    Review of Systems:  As per HPI- otherwise negative.   Physical Examination: Filed Vitals:   06/17/12 1323  BP: 146/76  Pulse: 84  Temp: 97.6 F (36.4 C)  Resp: 16   Filed Vitals:   06/17/12 1323  Height: 5' 11.5" (1.816 m)  Weight: 185 lb 6.4 oz (84.097 kg)   Body mass index is 25.50 kg/(m^2). Ideal Body Weight: Weight in (lb) to have BMI = 25: 181.4   GEN: WDWN, NAD, Non-toxic, A & O x 3, appears stated age, somewhat HOH HEENT: Atraumatic, Normocephalic. Neck supple. No masses, No LAD. Bilateral TM wnl, oropharynx normal.  PEERL,EOMI.   Ears and Nose: No external deformity. CV: RRR, No M/G/R. No JVD. No thrill. No extra heart sounds. PULM: CTA B, no wheezes, crackles, rhonchi. No retractions. No resp. distress. No accessory muscle use. ABD: S, ND, +BS. No rebound. No HSM.  No definite or specific tenderness on exam EXTR: No c/c/e GU: no masses, normal Rectal exam performed for stool test. No masses apparent NEURO Normal gait.  PSYCH: Normally interactive. Conversant. Not depressed or anxious appearing.  Calm demeanor.   Results for orders placed in visit on 06/17/12  POCT CBC      Component Value Range   WBC 7.6  4.6 - 10.2 K/uL   Lymph, poc 2.3  0.6 - 3.4   POC LYMPH PERCENT 30.5  10 - 50 %L   MID (cbc) 0.4  0 - 0.9   POC MID % 5.7  0 - 12 %M   POC Granulocyte 4.8  2 - 6.9   Granulocyte  percent 63.8  37 - 80 %G   RBC 3.82 (*) 4.69 - 6.13 M/uL   Hemoglobin 11.0 (*) 14.1 - 18.1 g/dL   HCT, POC 16.1 (*) 09.6 - 53.7 %   MCV 94.4  80 - 97 fL  MCH, POC 28.8  27 - 31.2 pg   MCHC 30.5 (*) 31.8 - 35.4 g/dL   RDW, POC 96.0     Platelet Count, POC 419  142 - 424 K/uL   MPV 7.5  0 - 99.8 fL  GLUCOSE, POCT (MANUAL RESULT ENTRY)      Component Value Range   POC Glucose 199 (*) 70 - 99 mg/dl  IFOBT (OCCULT BLOOD)      Component Value Range   IFOBT Negative       Assessment and Plan: 1. Diabetes mellitus, type 2  POCT glucose (manual entry)  2. Anemia  POCT CBC  3. Abdominal  pain, other specified site  POCT CBC, IFOBT POC (occult bld, rslt in office), Ambulatory referral to Gastroenterology   [name] is here today to establish care.  However, we are closed to new medicare patients in general.  If we are not able to find a PCP for him here I will try to refer to [facility].  In the meantime, will refer to GI to look at his abdominal pain of long duration.  Recent CMP looked ok except for elevated glucose and BUN/ creat.  He is anemic but doubt he will undergo a colonoscopy.  Due to his age and other medical problems will not do a PSA today either- he is in agreement.    DM- restarted metformin recently.  Will need a recheck A1c in abut 6 weeks.    Abbe Amsterdam, MD

## 2012-06-17 NOTE — Patient Instructions (Addendum)
I am going to send you to a stomach doctor to look at your abdominal pain.    Please see [place] in about 6 weeks to follow- up your diabetes.

## 2012-06-25 ENCOUNTER — Other Ambulatory Visit: Payer: Self-pay

## 2012-07-03 ENCOUNTER — Encounter: Payer: Self-pay | Admitting: Internal Medicine

## 2012-07-03 ENCOUNTER — Other Ambulatory Visit (INDEPENDENT_AMBULATORY_CARE_PROVIDER_SITE_OTHER): Payer: Medicare PPO

## 2012-07-03 ENCOUNTER — Ambulatory Visit (INDEPENDENT_AMBULATORY_CARE_PROVIDER_SITE_OTHER): Payer: Medicare PPO | Admitting: Internal Medicine

## 2012-07-03 VITALS — BP 132/78 | HR 92 | Temp 97.6°F | Ht 71.5 in | Wt 182.0 lb

## 2012-07-03 DIAGNOSIS — R7309 Other abnormal glucose: Secondary | ICD-10-CM

## 2012-07-03 DIAGNOSIS — R5381 Other malaise: Secondary | ICD-10-CM

## 2012-07-03 DIAGNOSIS — E1165 Type 2 diabetes mellitus with hyperglycemia: Secondary | ICD-10-CM

## 2012-07-03 DIAGNOSIS — N189 Chronic kidney disease, unspecified: Secondary | ICD-10-CM

## 2012-07-03 DIAGNOSIS — Z0000 Encounter for general adult medical examination without abnormal findings: Secondary | ICD-10-CM

## 2012-07-03 DIAGNOSIS — Z794 Long term (current) use of insulin: Secondary | ICD-10-CM

## 2012-07-03 DIAGNOSIS — Z125 Encounter for screening for malignant neoplasm of prostate: Secondary | ICD-10-CM

## 2012-07-03 DIAGNOSIS — R5383 Other fatigue: Secondary | ICD-10-CM

## 2012-07-03 DIAGNOSIS — R109 Unspecified abdominal pain: Secondary | ICD-10-CM

## 2012-07-03 DIAGNOSIS — E119 Type 2 diabetes mellitus without complications: Secondary | ICD-10-CM

## 2012-07-03 DIAGNOSIS — E785 Hyperlipidemia, unspecified: Secondary | ICD-10-CM

## 2012-07-03 DIAGNOSIS — R7302 Impaired glucose tolerance (oral): Secondary | ICD-10-CM

## 2012-07-03 DIAGNOSIS — H6120 Impacted cerumen, unspecified ear: Secondary | ICD-10-CM

## 2012-07-03 DIAGNOSIS — E118 Type 2 diabetes mellitus with unspecified complications: Secondary | ICD-10-CM

## 2012-07-03 DIAGNOSIS — I10 Essential (primary) hypertension: Secondary | ICD-10-CM

## 2012-07-03 LAB — LIPID PANEL: Cholesterol: 117 mg/dL (ref 0–200)

## 2012-07-03 LAB — BASIC METABOLIC PANEL
Calcium: 9.4 mg/dL (ref 8.4–10.5)
Glucose, Bld: 182 mg/dL — ABNORMAL HIGH (ref 70–99)

## 2012-07-03 LAB — CBC
Hemoglobin: 11.8 g/dL — ABNORMAL LOW (ref 13.0–17.0)
MCHC: 32.6 g/dL (ref 30.0–36.0)
MCV: 92.5 fl (ref 78.0–100.0)
Platelets: 196 10*3/uL (ref 150.0–400.0)

## 2012-07-03 LAB — PSA, MEDICARE: PSA: 1.64 ng/ml (ref 0.10–4.00)

## 2012-07-03 LAB — HEMOGLOBIN A1C: Hgb A1c MFr Bld: 9.6 % — ABNORMAL HIGH (ref 4.6–6.5)

## 2012-07-03 MED ORDER — TRAMADOL HCL 50 MG PO TABS: 50.0000 mg | ORAL_TABLET | Freq: Three times a day (TID) | ORAL | Status: DC | PRN

## 2012-07-03 NOTE — Progress Notes (Signed)
HPI  Pt presents to the clinic today to establish care. He was referred here by his cardiologists because he does not have a PCP. He has no concerns today.  Flu: never Pneumovax: never Coloscopy: due 07/14/2012 TD: greater than 10 years  Past Medical History  Diagnosis Date  . Coronary artery disease     post CABG in 1997 with abnormal nuclear stress test in 2007 which did demonstrate mild reversible ischemia and has been managed medically.   . Diabetes mellitus     type 2  . Hypertension   . Hyperlipidemia   . Hypercholesterolemia   . Atypical chest pain   . Acute on chronic renal insufficiency   . Hypernatremia   . History of hypoglycemia   . CKD (chronic kidney disease)   . Syncope   . CVA (cerebral infarction)   . Allergy   . History of chicken pox     Current Outpatient Prescriptions  Medication Sig Dispense Refill  . amLODipine (NORVASC) 10 MG tablet Take 10 mg by mouth daily.      Marland Kitchen atorvastatin (LIPITOR) 40 MG tablet Take 40 mg by mouth daily.      . insulin glargine (LANTUS) 100 UNIT/ML injection Inject 60 Units into the skin at bedtime. 56 units daily or As directed      . isosorbide mononitrate (IMDUR) 60 MG 24 hr tablet Take 60 mg by mouth daily.      Marland Kitchen losartan (COZAAR) 50 MG tablet Take 50 mg by mouth daily.      . metFORMIN (GLUCOPHAGE) 1000 MG tablet Take 1 tablet (1,000 mg total) by mouth 2 (two) times daily with a meal.  180 tablet  3  . metoprolol (LOPRESSOR) 50 MG tablet TAKE 1 TABLET BY MOUTH TWICE DAILY  180 tablet  1  . omeprazole (PRILOSEC) 20 MG capsule Take 20 mg by mouth daily.      . [DISCONTINUED] losartan (COZAAR) 50 MG tablet Take 1 tablet (50 mg total) by mouth daily.  90 tablet  3    No Known Allergies  Family History  Problem Relation Age of Onset  . Hypertension Other     Parent  . Diabetes Other     Parent  . Hyperlipidemia Other     Parent    History   Social History  . Marital Status: Married    Spouse Name: N/A    Number  of Children: N/A  . Years of Education: 13   Occupational History  . Retired    Social History Main Topics  . Smoking status: Never Smoker   . Smokeless tobacco: Never Used  . Alcohol Use: No  . Drug Use: No  . Sexually Active: Not Currently   Other Topics Concern  . Not on file   Social History Narrative   Regular exercise-noCaffeine Use-    ROS:  Constitutional: Denies fever, malaise, fatigue, headache or abrupt weight changes.  HEENT: Denies eye pain, eye redness, ear pain, ringing in the ears, wax buildup, runny nose, nasal congestion, bloody nose, or sore throat. Respiratory: Denies difficulty breathing, shortness of breath, cough or sputum production.   Cardiovascular: Denies chest pain, chest tightness, palpitations or swelling in the hands or feet.  Gastrointestinal: Pt reports abdominal pain. Denies bloating, constipation, diarrhea or blood in the stool.  GU: Denies frequency, urgency, pain with urination, blood in urine, odor or discharge. Musculoskeletal: Denies decrease in range of motion, difficulty with gait, muscle pain or joint pain and swelling.  Skin: Denies redness, rashes, lesions or ulcercations.  Neurological: Denies dizziness, difficulty with memory, difficulty with speech or problems with balance and coordination.   No other specific complaints in a complete review of systems (except as listed in HPI above).  PE:  BP 132/78  Pulse 92  Temp 97.6 F (36.4 C) (Oral)  Ht 5' 11.5" (1.816 m)  Wt 182 lb (82.555 kg)  BMI 25.03 kg/m2  SpO2 97% Wt Readings from Last 3 Encounters:  07/03/12 182 lb (82.555 kg)  06/17/12 185 lb 6.4 oz (84.097 kg)  06/08/12 184 lb (83.462 kg)    General: Appears his stated age, well developed, well nourished in NAD. HEENT: Head: normal shape and size; Eyes: sclera white, no icterus, conjunctiva pink, PERRLA and EOMs intact; Ears: Tm's [place] and intact, normal light reflex; Nose: mucosa pink and moist, septum midline;  Throat/Mouth: Teeth present, mucosa pink and moist, no lesions or ulcerations noted.  Neck: Normal range of motion. Neck supple, trachea midline. No massses, lumps or thyromegaly present.  Cardiovascular: Normal rate and rhythm. S1,S2 noted.  No murmur, rubs or gallops noted. No JVD or BLE edema. No carotid bruits noted. Pulmonary/Chest: Normal effort and positive vesicular breath sounds. No respiratory distress. No wheezes, rales or ronchi noted.  Abdomen: Soft and nontender. Normal bowel sounds, no bruits noted. No distention or masses noted. Liver, spleen and kidneys non palpable. Musculoskeletal: Normal range of motion. No signs of joint swelling. No difficulty with gait.  Neurological: Alert and oriented. Cranial nerves II-XII intact. Coordination normal. +DTRs bilaterally. Psychiatric: Mood and affect normal. Behavior is normal. Judgment and thought content normal.     Assessment and Plan:  Preventative Health maintenance:  PT declines flu, TDAP, pneumovax and zostavax today Will obtain basic screening labs Continue healthy diet and exercise  Dibetes Mellitus Type !!, uncontrolled:  Will check HgbA1C today Continue monitoring sugars at home Continue low carb diet  RTC in 3 months for reevaluation of DM

## 2012-07-03 NOTE — Patient Instructions (Addendum)

## 2012-07-04 DIAGNOSIS — N189 Chronic kidney disease, unspecified: Secondary | ICD-10-CM | POA: Insufficient documentation

## 2012-07-04 DIAGNOSIS — E785 Hyperlipidemia, unspecified: Secondary | ICD-10-CM | POA: Insufficient documentation

## 2012-07-04 DIAGNOSIS — I10 Essential (primary) hypertension: Secondary | ICD-10-CM | POA: Insufficient documentation

## 2012-07-13 ENCOUNTER — Encounter: Payer: Self-pay | Admitting: Family Medicine

## 2012-07-16 ENCOUNTER — Telehealth: Payer: Self-pay | Admitting: Cardiology

## 2012-07-20 ENCOUNTER — Other Ambulatory Visit: Payer: Self-pay

## 2012-07-20 MED ORDER — METOPROLOL TARTRATE 50 MG PO TABS: 50.0000 mg | ORAL_TABLET | Freq: Two times a day (BID) | ORAL | Status: DC

## 2012-07-21 ENCOUNTER — Other Ambulatory Visit: Payer: Self-pay | Admitting: Gastroenterology

## 2012-07-22 ENCOUNTER — Other Ambulatory Visit: Payer: Self-pay | Admitting: Gastroenterology

## 2012-07-22 ENCOUNTER — Ambulatory Visit (HOSPITAL_COMMUNITY)
Admission: RE | Admit: 2012-07-22 | Discharge: 2012-07-22 | Disposition: A | Discharge status: Home / Self Care | Payer: Medicare PPO | Source: Ambulatory Visit | Attending: Gastroenterology | Admitting: Gastroenterology

## 2012-07-22 ENCOUNTER — Encounter (HOSPITAL_COMMUNITY): Payer: Self-pay | Admitting: *Deleted

## 2012-07-22 ENCOUNTER — Encounter (HOSPITAL_COMMUNITY)
Admission: RE | Disposition: A | Discharge status: Home / Self Care | Payer: Self-pay | Source: Ambulatory Visit | Attending: Gastroenterology

## 2012-07-22 DIAGNOSIS — D649 Anemia, unspecified: Secondary | ICD-10-CM | POA: Insufficient documentation

## 2012-07-22 DIAGNOSIS — R109 Unspecified abdominal pain: Secondary | ICD-10-CM | POA: Insufficient documentation

## 2012-07-22 DIAGNOSIS — K5730 Diverticulosis of large intestine without perforation or abscess without bleeding: Secondary | ICD-10-CM | POA: Insufficient documentation

## 2012-07-22 DIAGNOSIS — I129 Hypertensive chronic kidney disease with stage 1 through stage 4 chronic kidney disease, or unspecified chronic kidney disease: Secondary | ICD-10-CM | POA: Insufficient documentation

## 2012-07-22 DIAGNOSIS — K644 Residual hemorrhoidal skin tags: Secondary | ICD-10-CM | POA: Insufficient documentation

## 2012-07-22 DIAGNOSIS — K648 Other hemorrhoids: Secondary | ICD-10-CM | POA: Insufficient documentation

## 2012-07-22 DIAGNOSIS — E7800 Pure hypercholesterolemia, unspecified: Secondary | ICD-10-CM | POA: Insufficient documentation

## 2012-07-22 DIAGNOSIS — I2510 Atherosclerotic heart disease of native coronary artery without angina pectoris: Secondary | ICD-10-CM | POA: Insufficient documentation

## 2012-07-22 DIAGNOSIS — Z951 Presence of aortocoronary bypass graft: Secondary | ICD-10-CM | POA: Insufficient documentation

## 2012-07-22 DIAGNOSIS — Z79899 Other long term (current) drug therapy: Secondary | ICD-10-CM | POA: Insufficient documentation

## 2012-07-22 DIAGNOSIS — E119 Type 2 diabetes mellitus without complications: Secondary | ICD-10-CM | POA: Insufficient documentation

## 2012-07-22 DIAGNOSIS — E785 Hyperlipidemia, unspecified: Secondary | ICD-10-CM | POA: Insufficient documentation

## 2012-07-22 DIAGNOSIS — N189 Chronic kidney disease, unspecified: Secondary | ICD-10-CM | POA: Insufficient documentation

## 2012-07-22 DIAGNOSIS — D126 Benign neoplasm of colon, unspecified: Secondary | ICD-10-CM | POA: Insufficient documentation

## 2012-07-22 DIAGNOSIS — Z8673 Personal history of transient ischemic attack (TIA), and cerebral infarction without residual deficits: Secondary | ICD-10-CM | POA: Insufficient documentation

## 2012-07-22 HISTORY — PX: COLONOSCOPY: SHX5424

## 2012-07-22 HISTORY — DX: Gastro-esophageal reflux disease without esophagitis: K21.9

## 2012-07-22 HISTORY — PX: ESOPHAGOGASTRODUODENOSCOPY: SHX5428

## 2012-07-22 HISTORY — DX: Personal history of other diseases of the digestive system: Z87.19

## 2012-07-22 SURGERY — EGD (ESOPHAGOGASTRODUODENOSCOPY)
Anesthesia: Moderate Sedation

## 2012-07-22 MED ORDER — MIDAZOLAM HCL 10 MG/2ML IJ SOLN
INTRAMUSCULAR | Status: DC | PRN
  Administered 2012-07-22 (×3): 2 mg via INTRAVENOUS

## 2012-07-22 MED ORDER — MIDAZOLAM HCL 10 MG/2ML IJ SOLN
INTRAMUSCULAR | Status: AC
  Filled 2012-07-22: qty 4

## 2012-07-22 MED ORDER — FENTANYL CITRATE 0.05 MG/ML IJ SOLN
INTRAMUSCULAR | Status: AC
  Filled 2012-07-22: qty 4

## 2012-07-22 MED ORDER — SODIUM CHLORIDE 0.9 % IV SOLN: INTRAVENOUS | Status: DC

## 2012-07-22 MED ORDER — FENTANYL CITRATE 0.05 MG/ML IJ SOLN
INTRAMUSCULAR | Status: DC | PRN
  Administered 2012-07-22: 12.5 ug via INTRAVENOUS
  Administered 2012-07-22 (×2): 25 ug via INTRAVENOUS

## 2012-07-22 MED ORDER — DIPHENHYDRAMINE HCL 50 MG/ML IJ SOLN
INTRAMUSCULAR | Status: AC
  Filled 2012-07-22: qty 1

## 2012-07-22 NOTE — H&P (View-Only) (Signed)
HPI  Pt presents to the clinic today to establish care. He was referred here by his cardiologists because he does not have a PCP. He has no concerns today.  Flu: never Pneumovax: never Coloscopy: due 07/14/2012 TD: greater than 10 years  Past Medical History  Diagnosis Date  . Coronary artery disease     post CABG in 1997 with abnormal nuclear stress test in 2007 which did demonstrate mild reversible ischemia and has been managed medically.   . Diabetes mellitus     type 2  . Hypertension   . Hyperlipidemia   . Hypercholesterolemia   . Atypical chest pain   . Acute on chronic renal insufficiency   . Hypernatremia   . History of hypoglycemia   . CKD (chronic kidney disease)   . Syncope   . CVA (cerebral infarction)   . Allergy   . History of chicken pox     Current Outpatient Prescriptions  Medication Sig Dispense Refill  . amLODipine (NORVASC) 10 MG tablet Take 10 mg by mouth daily.      . atorvastatin (LIPITOR) 40 MG tablet Take 40 mg by mouth daily.      . insulin glargine (LANTUS) 100 UNIT/ML injection Inject 60 Units into the skin at bedtime. 56 units daily or As directed      . isosorbide mononitrate (IMDUR) 60 MG 24 hr tablet Take 60 mg by mouth daily.      . losartan (COZAAR) 50 MG tablet Take 50 mg by mouth daily.      . metFORMIN (GLUCOPHAGE) 1000 MG tablet Take 1 tablet (1,000 mg total) by mouth 2 (two) times daily with a meal.  180 tablet  3  . metoprolol (LOPRESSOR) 50 MG tablet TAKE 1 TABLET BY MOUTH TWICE DAILY  180 tablet  1  . omeprazole (PRILOSEC) 20 MG capsule Take 20 mg by mouth daily.      . [DISCONTINUED] losartan (COZAAR) 50 MG tablet Take 1 tablet (50 mg total) by mouth daily.  90 tablet  3    No Known Allergies  Family History  Problem Relation Age of Onset  . Hypertension Other     Parent  . Diabetes Other     Parent  . Hyperlipidemia Other     Parent    History   Social History  . Marital Status: Married    Spouse Name: N/A    Number  of Children: N/A  . Years of Education: 13   Occupational History  . Retired    Social History Main Topics  . Smoking status: Never Smoker   . Smokeless tobacco: Never Used  . Alcohol Use: No  . Drug Use: No  . Sexually Active: Not Currently   Other Topics Concern  . Not on file   Social History Narrative   Regular exercise-noCaffeine Use-    ROS:  Constitutional: Denies fever, malaise, fatigue, headache or abrupt weight changes.  HEENT: Denies eye pain, eye redness, ear pain, ringing in the ears, wax buildup, runny nose, nasal congestion, bloody nose, or sore throat. Respiratory: Denies difficulty breathing, shortness of breath, cough or sputum production.   Cardiovascular: Denies chest pain, chest tightness, palpitations or swelling in the hands or feet.  Gastrointestinal: Pt reports abdominal pain. Denies bloating, constipation, diarrhea or blood in the stool.  GU: Denies frequency, urgency, pain with urination, blood in urine, odor or discharge. Musculoskeletal: Denies decrease in range of motion, difficulty with gait, muscle pain or joint pain and swelling.    Skin: Denies redness, rashes, lesions or ulcercations.  Neurological: Denies dizziness, difficulty with memory, difficulty with speech or problems with balance and coordination.   No other specific complaints in a complete review of systems (except as listed in HPI above).  PE:  BP 132/78  Pulse 92  Temp 97.6 F (36.4 C) (Oral)  Ht 5' 11.5" (1.816 m)  Wt 182 lb (82.555 kg)  BMI 25.03 kg/m2  SpO2 97% Wt Readings from Last 3 Encounters:  07/03/12 182 lb (82.555 kg)  06/17/12 185 lb 6.4 oz (84.097 kg)  06/08/12 184 lb (83.462 kg)    General: Appears his stated age, well developed, well nourished in NAD. HEENT: Head: normal shape and size; Eyes: sclera white, no icterus, conjunctiva pink, PERRLA and EOMs intact; Ears: Tm's [place] and intact, normal light reflex; Nose: mucosa pink and moist, septum midline;  Throat/Mouth: Teeth present, mucosa pink and moist, no lesions or ulcerations noted.  Neck: Normal range of motion. Neck supple, trachea midline. No massses, lumps or thyromegaly present.  Cardiovascular: Normal rate and rhythm. S1,S2 noted.  No murmur, rubs or gallops noted. No JVD or BLE edema. No carotid bruits noted. Pulmonary/Chest: Normal effort and positive vesicular breath sounds. No respiratory distress. No wheezes, rales or ronchi noted.  Abdomen: Soft and nontender. Normal bowel sounds, no bruits noted. No distention or masses noted. Liver, spleen and kidneys non palpable. Musculoskeletal: Normal range of motion. No signs of joint swelling. No difficulty with gait.  Neurological: Alert and oriented. Cranial nerves II-XII intact. Coordination normal. +DTRs bilaterally. Psychiatric: Mood and affect normal. Behavior is normal. Judgment and thought content normal.     Assessment and Plan:  Preventative Health maintenance:  PT declines flu, TDAP, pneumovax and zostavax today Will obtain basic screening labs Continue healthy diet and exercise  Dibetes Mellitus Type !!, uncontrolled:  Will check HgbA1C today Continue monitoring sugars at home Continue low carb diet  RTC in 3 months for reevaluation of DM 

## 2012-07-22 NOTE — Op Note (Signed)
[facility] [address] [place], [address]   OPERATIVE PROCEDURE REPORT  PATIENT: [name], [name]  MR#: [id number] BIRTHDATE: [birth year]  GENDER: Male ENDOSCOPIST: Jeani Hawking, MD ASSISTANT:   Angelique Blonder, RN and Windell Hummingbird, technician PROCEDURE DATE: 07/22/2012 PROCEDURE:   EGD, diagnostic ASA CLASS:   Class IV INDICATIONS:abdominal pain. MEDICATIONS: Versed 4 mg IV and Fentanyl 50 mcg IV TOPICAL ANESTHETIC:   none  DESCRIPTION OF PROCEDURE:   After the risks benefits and alternatives of the procedure were thoroughly explained, informed consent was obtained.  The endoscope [id number]  endoscope was introduced through the mouth  and advanced to the second portion of the duodenum Without limitations.      The instrument was slowly withdrawn as the mucosa was fully examined.    FINDINGS: The upper, middle and distal third of the esophagus were carefully inspected and no abnormalities were noted.  The z-line was well seen at the GEJ.  The endoscope was pushed into the fundus which was normal including a retroflexed view.  The antrum, gastric body, first and second part of the duodenum were unremarkable. Retroflexed views revealed no abnormalities.     The scope was then withdrawn from the patient and the procedure terminated.  COMPLICATIONS: There were no complications. IMPRESSION: 1) Normal EGD  RECOMMENDATIONS: 1) Proceed with the colonoscopy.   _______________________________ eSignedJeani Hawking, MD 07/22/2012 11:23 AM

## 2012-07-22 NOTE — Op Note (Signed)
[facility] [address] [place], [address]   OPERATIVE PROCEDURE REPORT  PATIENT: [name], [name]  MR#: [id number] BIRTHDATE: 22-Sep-1927  GENDER: Male ENDOSCOPIST: Jeani Hawking, MD ASSISTANT:   Angelique Blonder, RN Windell Hummingbird, technician PROCEDURE DATE: 07/22/2012 PROCEDURE:   Colonoscopy with snare polypectomy ASA CLASS:   Class IV INDICATIONS:ABM pain and anemia. MEDICATIONS: Versed 2 mg IV and Fentanyl 12.5 mcg IV  DESCRIPTION OF PROCEDURE:   After the risks benefits and alternatives of the procedure were thoroughly explained, informed consent was obtained.  A digital rectal exam revealed no abnormalities of the rectum.    The Pentax Colonoscope [id number] endoscope was introduced through the anus  and advanced to the cecum, which was identified by both the appendix and ileocecal valve , No adverse events experienced.    The quality of the prep was excellent. .  The instrument was then slowly withdrawn as the colon was fully examined.  FINDINGS: A 2 mm sessile ascending colon polyp was removed with a cold snare.  Two 1-1.5 cm sessile descending colon polyps were removed with a hot snare.  Several small 2-3 mm sessile descending colon polyps were removed with aa cold snare, but they were not retrieved.  Scattered diverticula were found throughout the colon, but the majority were in the left side of the colon..   Retroflexed views revealed internal/external hemorrhoids.     The scope was then withdrawn from the patient and the procedure terminated.  COMPLICATIONS: There were no complications.  IMPRESSION: 1) Polyp(s) were found. 2) Diverticula. 3) Hemorrhoids.  RECOMMENDATIONS: 1) Await biopsy results. 2) Miralax 1-2 capfuls per day. 3) Follow up in the office in 1 month. 4) Given the patient's age and comorbidities, a repeat colonoscopy may not be required despite the current findings.   _______________________________ eSignedJeani Hawking, MD  07/22/2012 11:53 AM

## 2012-07-22 NOTE — Interval H&P Note (Signed)
History and Physical Interval Note:  07/22/2012 10:48 AM  [name]  has presented today for surgery, with the diagnosis of IDA  The various methods of treatment have been discussed with the patient and family. After consideration of risks, benefits and other options for treatment, the patient has consented to  Procedure(s) (LRB) with comments: ESOPHAGOGASTRODUODENOSCOPY (EGD) (N/A) COLONOSCOPY (N/A) as a surgical intervention .  The patient's history has been reviewed, patient examined, no change in status, stable for surgery.  I have reviewed the patient's chart and labs.  Questions were answered to the patient's satisfaction.     Taren Dymek D

## 2012-07-23 ENCOUNTER — Encounter (HOSPITAL_COMMUNITY): Payer: Self-pay | Admitting: Gastroenterology

## 2012-08-21 ENCOUNTER — Encounter: Payer: Self-pay | Admitting: Family Medicine

## 2012-08-21 DIAGNOSIS — D649 Anemia, unspecified: Secondary | ICD-10-CM

## 2012-08-24 ENCOUNTER — Ambulatory Visit (INDEPENDENT_AMBULATORY_CARE_PROVIDER_SITE_OTHER): Payer: Medicare PPO | Admitting: Cardiology

## 2012-08-24 ENCOUNTER — Encounter: Payer: Self-pay | Admitting: Cardiology

## 2012-08-24 ENCOUNTER — Other Ambulatory Visit (INDEPENDENT_AMBULATORY_CARE_PROVIDER_SITE_OTHER): Payer: Medicare PPO

## 2012-08-24 VITALS — BP 132/60 | HR 85 | Ht 74.0 in | Wt 179.2 lb

## 2012-08-24 LAB — CBC
HCT: 30 % — ABNORMAL LOW (ref 39.0–52.0)
Hemoglobin: 9.9 g/dL — ABNORMAL LOW (ref 13.0–17.0)
MCHC: 33.2 g/dL (ref 30.0–36.0)
MCV: 93.3 fl (ref 78.0–100.0)
RDW: 15.5 % — ABNORMAL HIGH (ref 11.5–14.6)
WBC: 6.5 10*3/uL (ref 4.5–10.5)

## 2012-08-24 NOTE — Assessment & Plan Note (Signed)
We're checking fasting lipid panel today.  We will consider stopping his statin therapy temporarily and will also take note as to his liver function tests

## 2012-08-24 NOTE — Patient Instructions (Signed)
Will obtain labs today and call you with the results (lp/bmet/hfp/cbc/a1c)  RESTART ASPIRIN 81 MG DAILY  Your physician recommends that you schedule a follow-up appointment in: 4 months with fasting labs (lp/bmet/hfp/cbc)

## 2012-08-24 NOTE — Assessment & Plan Note (Signed)
The patient's wife states that he has not been having any hypoglycemic episodes.  Yesterday at home he had an episode of brief unresponsiveness which suggests possible TIA and we are restarting his daily aspirin 81 mg daily.

## 2012-08-24 NOTE — Assessment & Plan Note (Signed)
We want the patient to resume his baby aspirin 81 mg daily because of his ischemic heart disease.  We are checking a CBC on him today

## 2012-08-24 NOTE — Progress Notes (Signed)
[name] Date of Birth:  1927-12-03 [facility] [address] [place], [place]  [address] [phone]         Fax   [phone]  History of Present Illness: This pleasant 79 year old [place] American gentleman is seen for a scheduled followup office visit. He has a history of multiple medical problems which include remote CABG in 1997 and a cardiac catheter in 2000 and a mildly abnormal nuclear stress test in 2007. He also has hypertension chronic kidney disease hyperlipidemia and diabetes mellitus. He was recently hospitalized at [facility] with syncope. He had a very thorough workup. It apparently was felt that his symptoms were secondary to hypoglycemia and he was taken off his glimepiride.  Recently he has been having some new problems.  He complains that his legs hurt and are weak.  He has had a poor appetite.  He has had an unintended weight loss of 16 pounds.  He had a chest x-ray 06/08/12 which showed normal heart size and clear lungs.  He states he had a recent colonoscopy at [facility] long by Dr. Elnoria Howard which did not show any malignancy or anything to explain his weight loss.  He has been anemic and has not been taking his baby aspirin daily.  He and the family state that there is no history of any blood being found in his stool.   Current Outpatient Prescriptions  Medication Sig Dispense Refill  . amLODipine (NORVASC) 10 MG tablet Take 10 mg by mouth daily.      Marland Kitchen aspirin 81 MG tablet Take 81 mg by mouth daily.      Marland Kitchen atorvastatin (LIPITOR) 40 MG tablet Take 40 mg by mouth daily.      . insulin glargine (LANTUS) 100 UNIT/ML injection Inject 60 Units into the skin at bedtime. 56 units daily or As directed      . isosorbide mononitrate (IMDUR) 60 MG 24 hr tablet Take 60 mg by mouth daily.      Marland Kitchen losartan (COZAAR) 50 MG tablet Take 50 mg by mouth daily.      . metFORMIN (GLUCOPHAGE) 1000 MG tablet Take 1 tablet (1,000 mg total) by mouth 2 (two) times daily  with a meal.  180 tablet  3  . metoprolol (LOPRESSOR) 50 MG tablet Take 1 tablet (50 mg total) by mouth 2 (two) times daily.  180 tablet  1  . omeprazole (PRILOSEC) 20 MG capsule Take 20 mg by mouth daily.      . traMADol (ULTRAM) 50 MG tablet Take 1 tablet (50 mg total) by mouth every 8 (eight) hours as needed for pain.  75 tablet  0  . [DISCONTINUED] losartan (COZAAR) 50 MG tablet Take 1 tablet (50 mg total) by mouth daily.  90 tablet  3   No current facility-administered medications for this visit.    No Known Allergies  Patient Active Problem List  Diagnosis  . Ischemic heart disease  . Benign hypertensive heart disease without heart failure  . Diabetes mellitus type 2, insulin dependent  . Hypercholesterolemia  . Near syncope  . Dehydration  . Hyperglycemia  . History of hypoglycemia  . Altered mental state  . Frequency of urination and polyuria  . Chest pain  . CKD (chronic kidney disease)  . Syncope  . Chronic kidney disease  . Hyperlipidemia  . HTN (hypertension)  . Anemia    History  Smoking status  . Never Smoker   Smokeless tobacco  . Never Used  History  Alcohol Use No    Family History  Problem Relation Age of Onset  . Hypertension Other     Parent  . Diabetes Other     Parent  . Hyperlipidemia Other     Parent    Review of Systems: Constitutional: no fever chills diaphoresis or fatigue or change in weight.  Head and neck: no hearing loss, no epistaxis, no photophobia or visual disturbance. Respiratory: No cough, shortness of breath or wheezing. Cardiovascular: No chest pain peripheral edema, palpitations. Gastrointestinal: No abdominal distention, no abdominal pain, no change in bowel habits hematochezia or melena. Genitourinary: No dysuria, no frequency, no urgency, no nocturia. Musculoskeletal:No arthralgias, no back pain, no gait disturbance or myalgias. Neurological: No dizziness, no headaches, no numbness, no seizures, no syncope, no  weakness, no tremors. Hematologic: No lymphadenopathy, no easy bruising. Psychiatric: No confusion, no hallucinations, no sleep disturbance.    Physical Exam: Filed Vitals:   08/24/12 0947  BP: 132/60  Pulse: 85   the general appearance reveals a well-developed well-nourished elderly gentleman who appears to be chronically ill.The head and neck exam reveals pupils equal and reactive.  Extraocular movements are full.  There is no scleral icterus.  The mouth and pharynx are normal.  The neck is supple.  The carotids reveal no bruits.  The jugular venous pressure is normal.  The  thyroid is not enlarged.  There is no lymphadenopathy.  The chest is clear to percussion and auscultation.  There are no rales or rhonchi.  Expansion of the chest is symmetrical.  The precordium is quiet.  The first heart sound is normal.  The second heart sound is physiologically split.  There is no murmur gallop rub or click.  There is no abnormal lift or heave.  The abdomen is soft and nontender.  The bowel sounds are normal.  The liver and spleen are not enlarged.  There are no abdominal masses.  There are no abdominal bruits.  Extremities reveal good pedal pulses.  There is no phlebitis or edema.  There is no cyanosis or clubbing.  Strength is normal and symmetrical in all extremities.  There is no lateralizing weakness.  There are no sensory deficits.  The skin is warm and dry.  There is no rash.     Assessment / Plan: Await results of today's labs.  Restart baby aspirin 81 mg daily.  Recheck in 4 months for followup office visit CBC lipid panel basal metabolic panel and hepatic function panel.

## 2012-08-24 NOTE — Assessment & Plan Note (Signed)
Blood pressure was stable on current therapy.  He complains of weakness and unsteadiness in his legs related to his weight loss

## 2012-08-25 ENCOUNTER — Other Ambulatory Visit: Payer: Self-pay | Admitting: *Deleted

## 2012-08-25 MED ORDER — ATORVASTATIN CALCIUM 40 MG PO TABS: 40.0000 mg | ORAL_TABLET | Freq: Every day | ORAL | Status: DC

## 2012-09-28 ENCOUNTER — Telehealth: Payer: Self-pay | Admitting: Cardiology

## 2012-09-28 DIAGNOSIS — R899 Unspecified abnormal finding in specimens from other organs, systems and tissues: Secondary | ICD-10-CM

## 2012-09-28 DIAGNOSIS — R634 Abnormal weight loss: Secondary | ICD-10-CM

## 2012-09-28 NOTE — Telephone Encounter (Signed)
Advised wife and he will come 4/2 for additional labs and another cbc

## 2012-09-28 NOTE — Telephone Encounter (Signed)
Message copied by Burnell Blanks on Mon Sep 28, 2012  6:16 PM ------      Message from: Cassell Clement      Created: Mon Aug 24, 2012  8:12 PM       Hgb is lower than last month. The RBC indices do not suggest iron deficiency. CSD. Await results of LFTs and BMET ------

## 2012-09-28 NOTE — Telephone Encounter (Signed)
New problem:  Test results.  

## 2012-09-30 ENCOUNTER — Other Ambulatory Visit (INDEPENDENT_AMBULATORY_CARE_PROVIDER_SITE_OTHER): Payer: Medicare PPO

## 2012-09-30 DIAGNOSIS — R634 Abnormal weight loss: Secondary | ICD-10-CM

## 2012-09-30 DIAGNOSIS — R899 Unspecified abnormal finding in specimens from other organs, systems and tissues: Secondary | ICD-10-CM

## 2012-09-30 DIAGNOSIS — R6889 Other general symptoms and signs: Secondary | ICD-10-CM

## 2012-09-30 LAB — BASIC METABOLIC PANEL
BUN: 24 mg/dL — ABNORMAL HIGH (ref 6–23)
CO2: 19 mEq/L (ref 19–32)
Calcium: 9.4 mg/dL (ref 8.4–10.5)
Chloride: 115 mEq/L — ABNORMAL HIGH (ref 96–112)
Creatinine, Ser: 2.3 mg/dL — ABNORMAL HIGH (ref 0.4–1.5)

## 2012-09-30 LAB — VITAMIN B12: Vitamin B-12: 344 pg/mL (ref 211–911)

## 2012-09-30 LAB — HEPATIC FUNCTION PANEL
ALT: 10 U/L (ref 0–53)
Bilirubin, Direct: 0.1 mg/dL (ref 0.0–0.3)
Total Bilirubin: 0.7 mg/dL (ref 0.3–1.2)
Total Protein: 7.2 g/dL (ref 6.0–8.3)

## 2012-09-30 LAB — CBC WITH DIFFERENTIAL/PLATELET
Basophils Absolute: 0 10*3/uL (ref 0.0–0.1)
HCT: 32 % — ABNORMAL LOW (ref 39.0–52.0)
Lymphs Abs: 2.8 10*3/uL (ref 0.7–4.0)
Monocytes Relative: 5.7 % (ref 3.0–12.0)
Neutrophils Relative %: 43.9 % (ref 43.0–77.0)
Platelets: 172 10*3/uL (ref 150.0–400.0)
RDW: 13.7 % (ref 11.5–14.6)

## 2012-09-30 LAB — IBC PANEL
Saturation Ratios: 18.2 % — ABNORMAL LOW (ref 20.0–50.0)
Transferrin: 196 mg/dL — ABNORMAL LOW (ref 212.0–360.0)

## 2012-09-30 LAB — FOLATE: Folate: 13.7 ng/mL (ref >5.9)

## 2012-09-30 LAB — FERRITIN: Ferritin: 189.9 ng/mL (ref 22.0–322.0)

## 2012-09-30 NOTE — Progress Notes (Signed)
Quick Note:  Please report to patient. The recent labs are stable. Continue same medication and careful diet. The anemiia has improved since last time. Iron low. Take daily iron OTC ______

## 2012-10-01 ENCOUNTER — Telehealth: Payer: Self-pay | Admitting: *Deleted

## 2012-10-01 NOTE — Telephone Encounter (Signed)
Message copied by Burnell Blanks on Thu Oct 01, 2012  6:17 PM ------      Message from: Cassell Clement      Created: Wed Sep 30, 2012 10:09 PM       Please report to patient.  The recent labs are stable. Continue same medication and careful diet. The anemiia has improved since last time.  Iron low. Take daily iron OTC ------

## 2012-10-01 NOTE — Telephone Encounter (Signed)
Advised wife of labs and OTC iron

## 2012-10-02 ENCOUNTER — Encounter: Payer: Self-pay | Admitting: Internal Medicine

## 2012-10-02 ENCOUNTER — Ambulatory Visit (INDEPENDENT_AMBULATORY_CARE_PROVIDER_SITE_OTHER): Payer: Medicare PPO | Admitting: Internal Medicine

## 2012-10-02 VITALS — BP 110/62 | HR 68 | Temp 97.5°F | Ht 74.0 in | Wt 168.0 lb

## 2012-10-02 DIAGNOSIS — E785 Hyperlipidemia, unspecified: Secondary | ICD-10-CM

## 2012-10-02 DIAGNOSIS — E119 Type 2 diabetes mellitus without complications: Secondary | ICD-10-CM

## 2012-10-02 DIAGNOSIS — I10 Essential (primary) hypertension: Secondary | ICD-10-CM

## 2012-10-02 MED ORDER — GLIPIZIDE 10 MG PO TABS: 10.0000 mg | ORAL_TABLET | Freq: Two times a day (BID) | ORAL | Status: DC

## 2012-10-02 MED ORDER — AMLODIPINE BESYLATE 5 MG PO TABS: 5.0000 mg | ORAL_TABLET | Freq: Every day | ORAL | Status: DC

## 2012-10-02 NOTE — Progress Notes (Signed)
Subjective:    Patient ID: [name], male    DOB: 1928/04/22, 79 y.o.   MRN: [id number]  HPI  Pt presents to the clinic today for a 3 month f/u for Diabetes, Hypertension and Hyperlipidemia. He seems to have been doing well over the past 3 months. He has been tolerating his medications well without any side effects. He has been  Non compliant with taking all his medications. He only does his insulin 2 x per week. He only takes some of his medicines that he is prescribed but he isn't sure which ones. Additionally today, pt c/o of dizziness with standing. This is a new problem for him. He thinks he is probably getting up to quickly but he also reports that his blood pressure has been a little on the low side. He does not know if he is taking his prescribed blood pressure medication. He denies nausea or syncope. He denies chest pain, chest tightness or shortness of breath.  Review of Systems      Past Medical History  Diagnosis Date  . Coronary artery disease     post CABG in 1997 with abnormal nuclear stress test in 2007 which did demonstrate mild reversible ischemia and has been managed medically.   . Diabetes mellitus     type 2  . Hypertension   . Hyperlipidemia   . Hypercholesterolemia   . Atypical chest pain   . Hypernatremia   . History of hypoglycemia   . Syncope   . CVA (cerebral infarction)   . Allergy   . History of chicken pox   . Acute on chronic renal insufficiency   . CKD (chronic kidney disease)   . GERD (gastroesophageal reflux disease)   . H/O hiatal hernia     Current Outpatient Prescriptions  Medication Sig Dispense Refill  . amLODipine (NORVASC) 10 MG tablet Take 10 mg by mouth daily.      Marland Kitchen aspirin 81 MG tablet Take 81 mg by mouth daily.      Marland Kitchen atorvastatin (LIPITOR) 40 MG tablet Take 1 tablet (40 mg total) by mouth daily.  90 tablet  3  . insulin glargine (LANTUS) 100 UNIT/ML injection Inject 60 Units into the skin at bedtime. 56 units daily or As  directed      . IRON PO Take by mouth. otc iron      . isosorbide mononitrate (IMDUR) 60 MG 24 hr tablet Take 60 mg by mouth daily.      Marland Kitchen losartan (COZAAR) 50 MG tablet Take 50 mg by mouth daily.      . metFORMIN (GLUCOPHAGE) 1000 MG tablet Take 1 tablet (1,000 mg total) by mouth 2 (two) times daily with a meal.  180 tablet  3  . metoprolol (LOPRESSOR) 50 MG tablet Take 1 tablet (50 mg total) by mouth 2 (two) times daily.  180 tablet  1  . omeprazole (PRILOSEC) 20 MG capsule Take 20 mg by mouth daily.      . traMADol (ULTRAM) 50 MG tablet Take 1 tablet (50 mg total) by mouth every 8 (eight) hours as needed for pain.  75 tablet  0  . [DISCONTINUED] losartan (COZAAR) 50 MG tablet Take 1 tablet (50 mg total) by mouth daily.  90 tablet  3   No current facility-administered medications for this visit.    No Known Allergies  Family History  Problem Relation Age of Onset  . Hypertension Other     Parent  . Diabetes Other  Parent  . Hyperlipidemia Other     Parent    History   Social History  . Marital Status: Married    Spouse Name: N/A    Number of Children: N/A  . Years of Education: 13   Occupational History  . Retired    Social History Main Topics  . Smoking status: Never Smoker   . Smokeless tobacco: Never Used  . Alcohol Use: No  . Drug Use: No  . Sexually Active: Not Currently   Other Topics Concern  . Not on file   Social History Narrative   Regular exercise-no   Caffeine Use-     Constitutional: Denies fever, malaise, fatigue, headache or abrupt weight changes.  Respiratory: Denies difficulty breathing, shortness of breath, cough or sputum production.   Cardiovascular: Denies chest pain, chest tightness, palpitations or swelling in the hands or feet.  Neurological: Pt reports dizziness. Denies difficulty with memory, difficulty with speech or problems with balance and coordination.   No other specific complaints in a complete review of systems (except as  listed in HPI above).  Objective:   Physical Exam  BP 110/62  Pulse 68  Temp(Src) 97.5 F (36.4 C) (Oral)  Ht 6\' 2"  (1.88 m)  Wt 168 lb (76.204 kg)  BMI 21.56 kg/m2  SpO2 95% Wt Readings from Last 3 Encounters:  10/02/12 168 lb (76.204 kg)  08/24/12 179 lb 3.2 oz (81.285 kg)  07/22/12 182 lb (82.555 kg)    General: Appears their stated age, well developed, well nourished in NAD. Cardiovascular: Normal rate and rhythm. S1,S2 noted.  No murmur, rubs or gallops noted. No JVD or BLE edema. No carotid bruits noted. Pulmonary/Chest: Normal effort and positive vesicular breath sounds. No respiratory distress. No wheezes, rales or ronchi noted.   Neurological: Alert and oriented. Cranial nerves II-XII intact. Coordination normal. +DTRs bilaterally. Negative Rhomberg.  BMET    Component Value Date/Time   NA 142 09/30/2012 1057   K 4.1 09/30/2012 1057   CL 115* 09/30/2012 1057   CO2 19 09/30/2012 1057   GLUCOSE 90 09/30/2012 1057   BUN 24* 09/30/2012 1057   CREATININE 2.3* 09/30/2012 1057   CREATININE 2.08* 06/08/2012 1538   CALCIUM 9.4 09/30/2012 1057   GFRNONAA 26* 12/05/2011 2021   GFRAA 30* 12/05/2011 2021    Lipid Panel     Component Value Date/Time   CHOL 117 07/03/2012 1547   TRIG 183.0* 07/03/2012 1547   HDL 29.60* 07/03/2012 1547   CHOLHDL 4 07/03/2012 1547   VLDL 36.6 07/03/2012 1547   LDLCALC 51 07/03/2012 1547    CBC    Component Value Date/Time   WBC 6.1 09/30/2012 1057   WBC 7.6 06/17/2012 1401   RBC 3.42* 09/30/2012 1057   RBC 3.82* 06/17/2012 1401   HGB 10.7* 09/30/2012 1057   HGB 11.0* 06/17/2012 1401   HCT 32.0* 09/30/2012 1057   HCT 36.1* 06/17/2012 1401   PLT 172.0 09/30/2012 1057   MCV 93.6 09/30/2012 1057   MCV 94.4 06/17/2012 1401   MCH 28.8 06/17/2012 1401   MCH 30.1 12/05/2011 2021   MCHC 33.5 09/30/2012 1057   MCHC 30.5* 06/17/2012 1401   RDW 13.7 09/30/2012 1057   LYMPHSABS 2.8 09/30/2012 1057   MONOABS 0.3 09/30/2012 1057   EOSABS 0.3 09/30/2012 1057   BASOSABS 0.0 09/30/2012 1057     Hgb A1C Lab Results  Component Value Date   HGBA1C 9.6* 07/03/2012         Assessment &  Plan:

## 2012-10-02 NOTE — Assessment & Plan Note (Signed)
Well controlled Continue current therapy 

## 2012-10-02 NOTE — Assessment & Plan Note (Signed)
Uncontrolled Pt not taking insulin appropriately Will d/C lantus for now Continue your metformin Will add glipizide today Continue to check sugars, call me if > 200

## 2012-10-02 NOTE — Patient Instructions (Signed)
Diabetes and Standards of Medical Care  Diabetes is complicated. You may find that your diabetes team includes a dietitian, nurse, diabetes educator, eye doctor, and more. To help everyone know what is going on and to help you get the care you deserve, the following schedule of care was developed to help keep you on track. Below are the tests, exams, vaccines, medicines, education, and plans you will need. A1c test  Performed at least 2 times a year if you are meeting treatment goals.  Performed 4 times a year if therapy has changed or if you are not meeting therapy/glycemic goals. Aspirin medicine  Take daily as directed by your caregiver. Blood pressure test  Performed at every routine medical visit. The goal is less than 130/80 mm/Hg. Dental exam  Get a dental exam at least 2 times a year. Dilated eye exam (retinal exam)  Type 1 diabetes: Get an exam within 5 years of diagnosis and then yearly.  Type 2 diabetes: Get an exam at diagnosis and then yearly. All exams thereafter can be extended to every 2 to 3 years if one or more exams have been normal. Foot care exam  Visual foot exams are performed at every routine medical visit. The exams check for cuts, injuries, or other problems with the feet.  A comprehensive foot exam should be done yearly. This includes visual inspection as well as assessing foot pulses and testing for loss of sensation. Kidney function test (urine microalbumin)  Performed once a year.  Type 1 diabetes: The first test is performed 5 years after diagnosis.  Type 2 diabetes: The first test is performed at the time of diagnosis.  A serum creatinine and estimated glomerular filtration rate (eGFR) test is done once a year to tell the level of chronic kidney disease (CKD), if present. Lipid profile (Cholesterol, HDL, LDL, Triglycerides)  Performed once a year for most people. If at low risk, may be assessed every 2 years.  The goal for LDL is less than 100  mg/dl. If at high risk, the goal is less than 70 mg/dl.  The goal for HDL is higher than 40 mg/dl for men and higher than 50 mg/dl for women.  The goal for triglycerides is less than 150 mg/dl. Flu vaccine, pneumonia vaccine, and hepatitis B vaccine  The flu vaccine is recommended yearly.  The pneumonia vaccine is generally given once in a lifetime. However, there are some instances where another vaccine is recommended. Check with your caregiver.  The hepatitis B vaccine is also recommended for adults with diabetes. Diabetes self-management education  Recommended at diagnosis and ongoing as needed. Treatment plan  Reviewed at every medical visit. Document Released: 04/14/2009 Document Revised: 09/09/2011 Document Reviewed: 12/18/2010 [employer] Patient Information 2013 [employer].  

## 2012-10-02 NOTE — Assessment & Plan Note (Signed)
Well controlled but given dizziness with standing, will decrease Norvasc to 5 mg Continue to monitor blood pressure at home.  Call me if > 130/90

## 2012-12-19 ENCOUNTER — Other Ambulatory Visit: Payer: Self-pay | Admitting: Cardiology

## 2012-12-24 ENCOUNTER — Other Ambulatory Visit: Payer: Self-pay | Admitting: Cardiology

## 2013-02-22 ENCOUNTER — Other Ambulatory Visit: Payer: Self-pay | Admitting: Cardiology

## 2013-03-09 ENCOUNTER — Emergency Department (HOSPITAL_COMMUNITY): Payer: Medicare PPO

## 2013-03-09 ENCOUNTER — Encounter (HOSPITAL_COMMUNITY): Payer: Self-pay | Admitting: *Deleted

## 2013-03-09 ENCOUNTER — Emergency Department (HOSPITAL_COMMUNITY)
Admission: EM | Admit: 2013-03-09 | Discharge: 2013-03-10 | Disposition: A | Discharge status: Home / Self Care | Payer: Medicare PPO | Attending: Emergency Medicine | Admitting: Emergency Medicine

## 2013-03-09 DIAGNOSIS — W1809XA Striking against other object with subsequent fall, initial encounter: Secondary | ICD-10-CM | POA: Insufficient documentation

## 2013-03-09 DIAGNOSIS — Y939 Activity, unspecified: Secondary | ICD-10-CM | POA: Insufficient documentation

## 2013-03-09 DIAGNOSIS — S0100XA Unspecified open wound of scalp, initial encounter: Secondary | ICD-10-CM | POA: Insufficient documentation

## 2013-03-09 DIAGNOSIS — S0003XA Contusion of scalp, initial encounter: Secondary | ICD-10-CM | POA: Insufficient documentation

## 2013-03-09 DIAGNOSIS — E785 Hyperlipidemia, unspecified: Secondary | ICD-10-CM | POA: Insufficient documentation

## 2013-03-09 DIAGNOSIS — Y92009 Unspecified place in unspecified non-institutional (private) residence as the place of occurrence of the external cause: Secondary | ICD-10-CM | POA: Insufficient documentation

## 2013-03-09 DIAGNOSIS — Z951 Presence of aortocoronary bypass graft: Secondary | ICD-10-CM | POA: Insufficient documentation

## 2013-03-09 DIAGNOSIS — E1129 Type 2 diabetes mellitus with other diabetic kidney complication: Secondary | ICD-10-CM | POA: Insufficient documentation

## 2013-03-09 DIAGNOSIS — Z862 Personal history of diseases of the blood and blood-forming organs and certain disorders involving the immune mechanism: Secondary | ICD-10-CM | POA: Insufficient documentation

## 2013-03-09 DIAGNOSIS — I2510 Atherosclerotic heart disease of native coronary artery without angina pectoris: Secondary | ICD-10-CM | POA: Insufficient documentation

## 2013-03-09 DIAGNOSIS — I129 Hypertensive chronic kidney disease with stage 1 through stage 4 chronic kidney disease, or unspecified chronic kidney disease: Secondary | ICD-10-CM | POA: Insufficient documentation

## 2013-03-09 DIAGNOSIS — N189 Chronic kidney disease, unspecified: Secondary | ICD-10-CM

## 2013-03-09 DIAGNOSIS — Z8619 Personal history of other infectious and parasitic diseases: Secondary | ICD-10-CM | POA: Insufficient documentation

## 2013-03-09 DIAGNOSIS — Z79899 Other long term (current) drug therapy: Secondary | ICD-10-CM | POA: Insufficient documentation

## 2013-03-09 DIAGNOSIS — S065XAA Traumatic subdural hemorrhage with loss of consciousness status unknown, initial encounter: Secondary | ICD-10-CM

## 2013-03-09 DIAGNOSIS — Z8673 Personal history of transient ischemic attack (TIA), and cerebral infarction without residual deficits: Secondary | ICD-10-CM | POA: Insufficient documentation

## 2013-03-09 DIAGNOSIS — Z8719 Personal history of other diseases of the digestive system: Secondary | ICD-10-CM | POA: Insufficient documentation

## 2013-03-09 DIAGNOSIS — S0101XA Laceration without foreign body of scalp, initial encounter: Secondary | ICD-10-CM

## 2013-03-09 DIAGNOSIS — Z8639 Personal history of other endocrine, nutritional and metabolic disease: Secondary | ICD-10-CM | POA: Insufficient documentation

## 2013-03-09 DIAGNOSIS — S065X9A Traumatic subdural hemorrhage with loss of consciousness of unspecified duration, initial encounter: Secondary | ICD-10-CM

## 2013-03-09 DIAGNOSIS — Z7982 Long term (current) use of aspirin: Secondary | ICD-10-CM | POA: Insufficient documentation

## 2013-03-09 DIAGNOSIS — R55 Syncope and collapse: Secondary | ICD-10-CM

## 2013-03-09 DIAGNOSIS — E7800 Pure hypercholesterolemia, unspecified: Secondary | ICD-10-CM | POA: Insufficient documentation

## 2013-03-09 LAB — CBC
HCT: 34 % — ABNORMAL LOW (ref 39.0–52.0)
Hemoglobin: 11.8 g/dL — ABNORMAL LOW (ref 13.0–17.0)
MCH: 31.5 pg (ref 26.0–34.0)
MCHC: 34.7 g/dL (ref 30.0–36.0)
MCV: 90.7 fL (ref 78.0–100.0)
Platelets: 151 10*3/uL (ref 150–400)
RBC: 3.75 MIL/uL — ABNORMAL LOW (ref 4.22–5.81)
RDW: 12.5 % (ref 11.5–15.5)
WBC: 5.4 10*3/uL (ref 4.0–10.5)

## 2013-03-09 LAB — BASIC METABOLIC PANEL
BUN: 30 mg/dL — ABNORMAL HIGH (ref 6–23)
Chloride: 106 mEq/L (ref 96–112)
GFR calc Af Amer: 26 mL/min — ABNORMAL LOW (ref >90)
GFR calc non Af Amer: 22 mL/min — ABNORMAL LOW (ref >90)
Potassium: 4.6 mEq/L (ref 3.5–5.1)
Sodium: 138 mEq/L (ref 135–145)

## 2013-03-09 LAB — GLUCOSE, CAPILLARY: Glucose-Capillary: 295 mg/dL — ABNORMAL HIGH (ref 70–99)

## 2013-03-09 LAB — TROPONIN I: Troponin I: <0.3 ng/mL (ref <0.30)

## 2013-03-09 LAB — BASIC METABOLIC PANEL WITH GFR
CO2: 18 meq/L — ABNORMAL LOW (ref 19–32)
Calcium: 9 mg/dL (ref 8.4–10.5)
Creatinine, Ser: 2.48 mg/dL — ABNORMAL HIGH (ref 0.50–1.35)
Glucose, Bld: 307 mg/dL — ABNORMAL HIGH (ref 70–99)

## 2013-03-09 MED ORDER — LIDOCAINE-EPINEPHRINE-TETRACAINE (LET) SOLUTION
3.0000 mL | Freq: Once | NASAL | Status: AC
  Administered 2013-03-09: 3 mL via TOPICAL
  Filled 2013-03-09: qty 3

## 2013-03-09 NOTE — ED Notes (Signed)
Laceration on head was cleaned and irrigated. Then LET solution was applied.

## 2013-03-09 NOTE — ED Notes (Signed)
Per EMS - pt was sitting in a chair eating dinner, pt fell from chair to the floor. Pt hit his head on a brick mantel during fall and sustained a scalp laceration approx 1in, bleeding controlled by EMS pta. Pt states he is "unsure" of LOC however states he does not remember how he got on the floor. Pt denies any pain at present. Pt A&Ox4

## 2013-03-09 NOTE — ED Provider Notes (Signed)
CSN: [id number]     Arrival date & time 03/09/13  2013 History   First MD Initiated Contact with Patient 03/09/13 2051     Chief Complaint  Patient presents with  . Fall   (Consider location/radiation/quality/duration/timing/severity/associated sxs/prior Treatment) HPI Comments: Pt comes by EMS from home.  Pt was sitting, eating dinner with spouse and passed out and slumped to the ground hitting head on mantle on left posterior side, some bleeding, controlled with pressure.  No numbness or weakness in arms or legs.  No CP, SOB.  He did ask his spouse to turn on the air before this event, but pt doesn't recall if he felt faint prior to the event.  Pt has h/o CAD s/p CABG.  No CP  Now, no sweats, nausea.  Pt is on baby aspirin, no other blood thinners.  No abd pain, back pain.  No pain to shoulders, arms, legs, hips.  No back pain.  EMS reports blood sugar was greater than 100.    Patient is a 79 y.o. male presenting with fall. The history is provided by the patient, the spouse, a relative and medical records.  Fall This is a new problem. The current episode started 1 to 2 hours ago. Associated symptoms include headaches. Pertinent negatives include no chest pain, no abdominal pain and no shortness of breath.    Past Medical History  Diagnosis Date  . Coronary artery disease     post CABG in 1997 with abnormal nuclear stress test in 2007 which did demonstrate mild reversible ischemia and has been managed medically.   . Diabetes mellitus     type 2  . Hypertension   . Hyperlipidemia   . Hypercholesterolemia   . Atypical chest pain   . Hypernatremia   . History of hypoglycemia   . Syncope   . CVA (cerebral infarction)   . Allergy   . History of chicken pox   . Acute on chronic renal insufficiency   . CKD (chronic kidney disease)   . GERD (gastroesophageal reflux disease)   . H/O hiatal hernia    Past Surgical History  Procedure Laterality Date  . Coronary artery bypass graft  1997    x3 -- patent internal mammary graft to saphenous vein grafts x3  . Tonsillectomy    . Cardiac catheterization  01/26/1999    patent internal mammary graft to saphenous vein grafts x3 -- singnificant three-vessel coroanry artery disease -- potential sites of ischemia involving the intermediate branch, circumflex branch and the distal LAD through diffuse disease, as well as steal type syndrome -- normal LV function -- W. Ashley Royalty., M.D.    . Esophagogastroduodenoscopy  07/22/2012    Procedure: ESOPHAGOGASTRODUODENOSCOPY (EGD);  Surgeon: Theda Belfast, MD;  Location: [facility];  Service: Endoscopy;  Laterality: N/A;  . Colonoscopy  07/22/2012    Procedure: COLONOSCOPY;  Surgeon: Theda Belfast, MD;  Location: [facility];  Service: Endoscopy;  Laterality: N/A;   Family History  Problem Relation Age of Onset  . Hypertension Other     Parent  . Diabetes Other     Parent  . Hyperlipidemia Other     Parent   History  Substance Use Topics  . Smoking status: Never Smoker   . Smokeless tobacco: Never Used  . Alcohol Use: No    Review of Systems  Constitutional: Negative for diaphoresis.  HENT: Negative for neck pain and neck stiffness.   Respiratory: Negative for shortness of breath.  Cardiovascular: Negative for chest pain.  Gastrointestinal: Negative for nausea, vomiting, abdominal pain, diarrhea and blood in stool.  Musculoskeletal: Negative for back pain.  Neurological: Positive for syncope and headaches. Negative for speech difficulty, weakness and numbness.  All other systems reviewed and are negative.    Allergies  Review of patient's allergies indicates no known allergies.  Home Medications   Current Outpatient Rx  Name  Route  Sig  Dispense  Refill  . amLODipine (NORVASC) 5 MG tablet   Oral   Take 1 tablet (5 mg total) by mouth daily.   90 tablet   3   . aspirin 81 MG tablet   Oral   Take 81 mg by mouth daily.         Marland Kitchen glipiZIDE (GLUCOTROL) 10 MG  tablet   Oral   Take 1 tablet (10 mg total) by mouth 2 (two) times daily before a meal.   60 tablet   3   . isosorbide mononitrate (IMDUR) 60 MG 24 hr tablet      TAKE 1 TABLET BY MOUTH EVERY MORNING   90 tablet   0   . metoprolol (LOPRESSOR) 50 MG tablet      TAKE 1 TABLET BY MOUTH TWICE DAILY   180 tablet   0   . omeprazole (PRILOSEC) 20 MG capsule   Oral   Take 20 mg by mouth daily.         . traMADol (ULTRAM) 50 MG tablet   Oral   Take 1 tablet (50 mg total) by mouth every 8 (eight) hours as needed for pain.   75 tablet   0   . EXPIRED: losartan (COZAAR) 50 MG tablet   Oral   Take 50 mg by mouth daily.          BP 171/88  Pulse 54  Temp(Src) 97.4 F (36.3 C) (Oral)  Resp 9  SpO2 98% Physical Exam  Nursing note and vitals reviewed. Constitutional: He is oriented to person, place, and time. He appears well-developed and well-nourished.  Non-toxic appearance. He does not have a sickly appearance. No distress.  HENT:  Head: Normocephalic.    Right Ear: External ear and ear canal normal.  Left Ear: External ear and ear canal normal.  Eyes: Conjunctivae and EOM are normal. No scleral icterus.  Neck: Normal range of motion and full passive range of motion without pain. Neck supple. No spinous process tenderness and no muscular tenderness present. No rigidity. Normal range of motion present. No Brudzinski's sign and no Kernig's sign noted.  Cardiovascular: Normal rate, regular rhythm and intact distal pulses.   No murmur heard. Pulmonary/Chest: Effort normal. No respiratory distress. He has no wheezes.  Abdominal: Soft. He exhibits no distension and no mass. There is no tenderness. There is no rebound and no guarding.  Neurological: He is alert and oriented to person, place, and time. He is not disoriented. He displays no tremor. No cranial nerve deficit or sensory deficit. He exhibits normal muscle tone. Coordination normal.  No drift, normal finger to nose    Skin: Skin is warm and dry. No rash noted. He is not diaphoretic.  Psychiatric: He has a normal mood and affect.    ED Course  LACERATION REPAIR Date/Time: 03/09/2013 11:52 PM Performed by: Lear Ng Authorized by: Lear Ng Consent: Verbal consent obtained. written consent not obtained. Risks and benefits: risks, benefits and alternatives were discussed Consent given by: patient and spouse Patient understanding: patient states  understanding of the procedure being performed Patient consent: the patient's understanding of the procedure matches consent given Imaging studies: imaging studies available Patient identity confirmed: verbally with patient and arm band Time out: Immediately prior to procedure a "time out" was called to verify the correct patient, procedure, equipment, support staff and site/side marked as required. Body area: head/neck Location details: scalp Laceration length: 2 cm Foreign bodies: no foreign bodies Tendon involvement: none Nerve involvement: none Vascular damage: no Local anesthetic: LET (lido,epi,tetracaine) Anesthetic total: 1 ml Patient sedated: no Preparation: Patient was prepped and draped in the usual sterile fashion. Irrigation solution: saline Amount of cleaning: standard Skin closure: staples Number of sutures: 1 Technique: simple Approximation: close Approximation difficulty: simple Patient tolerance: Patient tolerated the procedure well with no immediate complications.   (including critical care time)     Labs Review Labs Reviewed  CBC - Abnormal; Notable for the following:    RBC 3.75 (*)    Hemoglobin 11.8 (*)    HCT 34.0 (*)    All other components within normal limits  BASIC METABOLIC PANEL - Abnormal; Notable for the following:    CO2 18 (*)    Glucose, Bld 307 (*)    BUN 30 (*)    Creatinine, Ser 2.48 (*)    GFR calc non Af Amer 22 (*)    GFR calc Af Amer 26 (*)    All other components within normal  limits  GLUCOSE, CAPILLARY - Abnormal; Notable for the following:    Glucose-Capillary 295 (*)    All other components within normal limits  TROPONIN I   Imaging Review Ct Head Wo Contrast  03/09/2013   *RADIOLOGY REPORT*  Clinical Data: Syncope.  Head injury  CT HEAD WITHOUT CONTRAST  Technique:  Contiguous axial images were obtained from the base of the skull through the vertex without contrast.  Comparison: 12/06/2011  Findings: There is diffuse patchy low density throughout the subcortical and periventricular white matter consistent with chronic small vessel ischemic change.  There is prominence of the sulci and ventricles consistent with brain atrophy. Encephalomalacia involving the left posterior parietal and occipital lobes appears similar to previous exam.  Subacute on chronic right subdural hematoma is identified which measures up to 6 mm in thickness, image 23/series 2.  There is no significant mass effect upon the adjacent brain parenchyma.  Large, left sided subdural hygroma is unchanged from previous exam measuring up to 1.7 cm.  There is mild mucosal thickening involving the sphenoid sinus.  The mastoid air cells appear clear.  The skull is intact.  IMPRESSION:  1.  No acute intracranial abnormalities. 2.  Advance small vessel ischemic disease and brain atrophy. 3.  Subacute on chronic right subdural hematoma.   Original Report Authenticated By: Signa Kell, M.D.    Ra sat is 98% and I interpret to be adequate  EKG at time 20:29, shows sinus rhythm at a rate of 59, left axis deviation. RS are Prime in lead V2. Minimal ST elevation in inferior leads with no reciprocal changes. Nonspecific T-wave abnormality is no longer seeing compared to EKG from 04/29/2012.  In my estimation, given the patient has no chest pain, diaphoresis, shortness of breath, nausea, minimal ST elevation in inferior leads with no clinical symptoms of ACS is reassuring.  11:53 PM Renal insuff is baseline.   Troponin is negative.  CT shwos subacute subdural on chronic subdural.  Pt remains alert, normal mentation, no distress, again no arm drift, focal weakness. Will  review CT scan with Dr. Danielle Dess with [facility].  12:42 AM Dr. Danielle Dess has reviewed images, feels subdurals are all old, not acute or subacute.  He doesn't feel pt requires observation in the hosptial. Discussed my discussion with Dr. Danielle Dess with family. They are comfortable going home, would rather go home and can watch pt there, return precautions discussed.   MDM   1. Syncope   2. Subacute subdural hematoma   3. Scalp laceration, initial encounter   4. Chronic renal insufficiency      Pt with syncope.  Family member thinks pt may have just fallen asleep after eating.  Pt also has h/o syncope on medical records.  Pt has no CP, ECG shows no ischemia, troponin is normal.  Will get head CT due to sig injury.  No neck pain, no focal deficits.  Not on blood thinners.          Gavin Pound. Oletta Lamas, MD 03/11/13 2155

## 2013-03-10 NOTE — Discharge Instructions (Signed)
Laceration Care, Adult °A laceration is a cut or lesion that goes through all layers of the skin and into the tissue just beneath the skin. °TREATMENT  °Some lacerations may not require closure. Some lacerations may not be able to be closed due to an increased risk of infection. It is important to see your caregiver as soon as possible after an injury to minimize the risk of infection and maximize the opportunity for successful closure. °If closure is appropriate, pain medicines may be given, if needed. The wound will be cleaned to help prevent infection. Your caregiver will use stitches (sutures), staples, wound glue (adhesive), or skin adhesive strips to repair the laceration. These tools bring the skin edges together to allow for faster healing and a better cosmetic outcome. However, all wounds will heal with a scar. Once the wound has healed, scarring can be minimized by covering the wound with sunscreen during the day for 1 full year. °HOME CARE INSTRUCTIONS  °For sutures or staples: °· Keep the wound clean and dry. °· If you were given a bandage (dressing), you should change it at least once a day. Also, change the dressing if it becomes wet or dirty, or as directed by your caregiver. °· Wash the wound with soap and water 2 times a day. Rinse the wound off with water to remove all soap. Pat the wound dry with a clean towel. °· After cleaning, apply a thin layer of the antibiotic ointment as recommended by your caregiver. This will help prevent infection and keep the dressing from sticking. °· You may shower as usual after the first 24 hours. Do not soak the wound in water until the sutures are removed. °· Only take over-the-counter or prescription medicines for pain, discomfort, or fever as directed by your caregiver. °· Get your sutures or staples removed as directed by your caregiver. °For skin adhesive strips: °· Keep the wound clean and dry. °· Do not get the skin adhesive strips wet. You may bathe  carefully, using caution to keep the wound dry. °· If the wound gets wet, pat it dry with a clean towel. °· Skin adhesive strips will fall off on their own. You may trim the strips as the wound heals. Do not remove skin adhesive strips that are still stuck to the wound. They will fall off in time. °For wound adhesive: °· You may briefly wet your wound in the shower or bath. Do not soak or scrub the wound. Do not swim. Avoid periods of heavy perspiration until the skin adhesive has fallen off on its own. After showering or bathing, gently pat the wound dry with a clean towel. °· Do not apply liquid medicine, cream medicine, or ointment medicine to your wound while the skin adhesive is in place. This may loosen the film before your wound is healed. °· If a dressing is placed over the wound, be careful not to apply tape directly over the skin adhesive. This may cause the adhesive to be pulled off before the wound is healed. °· Avoid prolonged exposure to sunlight or tanning lamps while the skin adhesive is in place. Exposure to ultraviolet light in the first year will darken the scar. °· The skin adhesive will usually remain in place for 5 to 10 days, then naturally fall off the skin. Do not pick at the adhesive film. °You may need a tetanus shot if: °· You cannot remember when you had your last tetanus shot. °· You have never had a tetanus   shot. If you get a tetanus shot, your arm may swell, get red, and feel warm to the touch. This is common and not a problem. If you need a tetanus shot and you choose not to have one, there is a rare chance of getting tetanus. Sickness from tetanus can be serious. SEEK MEDICAL CARE IF:   You have redness, swelling, or increasing pain in the wound.  You see a red line that goes away from the wound.  You have yellowish-white fluid (pus) coming from the wound.  You have a fever.  You notice a bad smell coming from the wound or dressing.  Your wound breaks open before or  after sutures have been removed.  You notice something coming out of the wound such as wood or glass.  Your wound is on your hand or foot and you cannot move a finger or toe. SEEK IMMEDIATE MEDICAL CARE IF:   Your pain is not controlled with prescribed medicine.  You have severe swelling around the wound causing pain and numbness or a change in color in your arm, hand, leg, or foot.  Your wound splits open and starts bleeding.  You have worsening numbness, weakness, or loss of function of any joint around or beyond the wound.  You develop painful lumps near the wound or on the skin anywhere on your body. MAKE SURE YOU:   Understand these instructions.  Will watch your condition.  Will get help right away if you are not doing well or get worse. Document Released: 06/17/2005 Document Revised: 09/09/2011 Document Reviewed: 12/11/2010 [facility]ExitCare Patient Information 2014 [place]Care, [place].   Syncope Syncope is a fainting spell. This means the person loses consciousness and drops to the ground. The person is generally unconscious for less than 5 minutes. The person may have some muscle twitches for up to 15 seconds before waking up and returning to normal. Syncope occurs more often in elderly people, but it can happen to anyone. While most causes of syncope are not dangerous, syncope can be a sign of a serious medical problem. It is important to seek medical care.  CAUSES  Syncope is caused by a sudden decrease in blood flow to the brain. The specific cause is often not determined. Factors that can trigger syncope include:  Taking medicines that lower blood pressure.  Sudden changes in posture, such as standing up suddenly.  Taking more medicine than prescribed.  Standing in one place for too long.  Seizure disorders.  Dehydration and excessive exposure to heat.  Low blood sugar (hypoglycemia).  Straining to have a bowel movement.  Heart disease, irregular heartbeat, or other  circulatory problems.  Fear, emotional distress, seeing blood, or severe pain. SYMPTOMS  Right before fainting, you may:  Feel dizzy or lightheaded.  Feel nauseous.  See all white or all black in your field of vision.  Have cold, clammy skin. DIAGNOSIS  Your caregiver will ask about your symptoms, perform a physical exam, and perform electrocardiography (ECG) to record the electrical activity of your heart. Your caregiver may also perform other heart or blood tests to determine the cause of your syncope. TREATMENT  In most cases, no treatment is needed. Depending on the cause of your syncope, your caregiver may recommend changing or stopping some of your medicines. HOME CARE INSTRUCTIONS  Have someone stay with you until you feel stable.  Do not drive, operate machinery, or play sports until your caregiver says it is okay.  Keep all follow-up appointments as directed  by your caregiver.  Lie down right away if you start feeling like you might faint. Breathe deeply and steadily. Wait until all the symptoms have passed.  Drink enough fluids to keep your urine clear or pale yellow.  If you are taking blood pressure or heart medicine, get up slowly, taking several minutes to sit and then stand. This can reduce dizziness. SEEK IMMEDIATE MEDICAL CARE IF:   You have a severe headache.  You have unusual pain in the chest, abdomen, or back.  You are bleeding from the mouth or rectum, or you have black or tarry stool.  You have an irregular or very fast heartbeat.  You have pain with breathing.  You have repeated fainting or seizure-like jerking during an episode.  You faint when sitting or lying down.  You have confusion.  You have difficulty walking.  You have severe weakness.  You have vision problems. If you fainted, call your local emergency services (911 in [place] Do not drive yourself to the hospital.  MAKE SURE YOU:  Understand these instructions.  Will watch  your condition.  Will get help right away if you are not doing well or get worse. Document Released: 06/17/2005 Document Revised: 12/17/2011 Document Reviewed: 08/16/2011 [facility]Exit[facility] Patient Information 2014 [place] RidgeExitCare, MarylandLLC.

## 2013-03-11 ENCOUNTER — Encounter (HOSPITAL_COMMUNITY): Payer: Self-pay | Admitting: Family Medicine

## 2013-03-11 ENCOUNTER — Emergency Department (HOSPITAL_COMMUNITY): Payer: Medicare PPO

## 2013-03-11 ENCOUNTER — Inpatient Hospital Stay (HOSPITAL_COMMUNITY)
Admission: EM | Admit: 2013-03-11 | Discharge: 2013-03-22 | DRG: 152 | Disposition: A | Discharge status: Skilled Nursing Facility | Payer: Medicare PPO | Attending: Family Medicine | Admitting: Family Medicine

## 2013-03-11 ENCOUNTER — Other Ambulatory Visit: Payer: Self-pay | Admitting: Internal Medicine

## 2013-03-11 DIAGNOSIS — N189 Chronic kidney disease, unspecified: Secondary | ICD-10-CM | POA: Diagnosis present

## 2013-03-11 DIAGNOSIS — E870 Hyperosmolality and hypernatremia: Secondary | ICD-10-CM | POA: Diagnosis present

## 2013-03-11 DIAGNOSIS — R55 Syncope and collapse: Secondary | ICD-10-CM

## 2013-03-11 DIAGNOSIS — N184 Chronic kidney disease, stage 4 (severe): Secondary | ICD-10-CM | POA: Diagnosis present

## 2013-03-11 DIAGNOSIS — R739 Hyperglycemia, unspecified: Secondary | ICD-10-CM

## 2013-03-11 DIAGNOSIS — K219 Gastro-esophageal reflux disease without esophagitis: Secondary | ICD-10-CM | POA: Diagnosis present

## 2013-03-11 DIAGNOSIS — R3989 Other symptoms and signs involving the genitourinary system: Secondary | ICD-10-CM | POA: Diagnosis present

## 2013-03-11 DIAGNOSIS — R350 Frequency of micturition: Secondary | ICD-10-CM

## 2013-03-11 DIAGNOSIS — G255 Other chorea: Secondary | ICD-10-CM | POA: Diagnosis not present

## 2013-03-11 DIAGNOSIS — Z8673 Personal history of transient ischemic attack (TIA), and cerebral infarction without residual deficits: Secondary | ICD-10-CM

## 2013-03-11 DIAGNOSIS — Z79899 Other long term (current) drug therapy: Secondary | ICD-10-CM

## 2013-03-11 DIAGNOSIS — D649 Anemia, unspecified: Secondary | ICD-10-CM

## 2013-03-11 DIAGNOSIS — I951 Orthostatic hypotension: Secondary | ICD-10-CM | POA: Diagnosis present

## 2013-03-11 DIAGNOSIS — W1789XA Other fall from one level to another, initial encounter: Secondary | ICD-10-CM | POA: Diagnosis present

## 2013-03-11 DIAGNOSIS — E785 Hyperlipidemia, unspecified: Secondary | ICD-10-CM | POA: Diagnosis present

## 2013-03-11 DIAGNOSIS — I119 Hypertensive heart disease without heart failure: Secondary | ICD-10-CM

## 2013-03-11 DIAGNOSIS — I129 Hypertensive chronic kidney disease with stage 1 through stage 4 chronic kidney disease, or unspecified chronic kidney disease: Secondary | ICD-10-CM | POA: Diagnosis present

## 2013-03-11 DIAGNOSIS — R4182 Altered mental status, unspecified: Secondary | ICD-10-CM

## 2013-03-11 DIAGNOSIS — I259 Chronic ischemic heart disease, unspecified: Secondary | ICD-10-CM | POA: Diagnosis present

## 2013-03-11 DIAGNOSIS — E7800 Pure hypercholesterolemia, unspecified: Secondary | ICD-10-CM | POA: Diagnosis present

## 2013-03-11 DIAGNOSIS — R079 Chest pain, unspecified: Secondary | ICD-10-CM

## 2013-03-11 DIAGNOSIS — Z8639 Personal history of other endocrine, nutritional and metabolic disease: Secondary | ICD-10-CM

## 2013-03-11 DIAGNOSIS — Z7982 Long term (current) use of aspirin: Secondary | ICD-10-CM

## 2013-03-11 DIAGNOSIS — S065X9A Traumatic subdural hemorrhage with loss of consciousness of unspecified duration, initial encounter: Secondary | ICD-10-CM

## 2013-03-11 DIAGNOSIS — I2510 Atherosclerotic heart disease of native coronary artery without angina pectoris: Secondary | ICD-10-CM | POA: Diagnosis present

## 2013-03-11 DIAGNOSIS — N179 Acute kidney failure, unspecified: Secondary | ICD-10-CM | POA: Diagnosis present

## 2013-03-11 DIAGNOSIS — J020 Streptococcal pharyngitis: Principal | ICD-10-CM | POA: Diagnosis present

## 2013-03-11 DIAGNOSIS — W19XXXA Unspecified fall, initial encounter: Secondary | ICD-10-CM | POA: Diagnosis present

## 2013-03-11 DIAGNOSIS — E860 Dehydration: Secondary | ICD-10-CM

## 2013-03-11 DIAGNOSIS — Z833 Family history of diabetes mellitus: Secondary | ICD-10-CM

## 2013-03-11 DIAGNOSIS — E119 Type 2 diabetes mellitus without complications: Secondary | ICD-10-CM | POA: Diagnosis present

## 2013-03-11 DIAGNOSIS — Z951 Presence of aortocoronary bypass graft: Secondary | ICD-10-CM

## 2013-03-11 DIAGNOSIS — S065X0A Traumatic subdural hemorrhage without loss of consciousness, initial encounter: Secondary | ICD-10-CM | POA: Diagnosis present

## 2013-03-11 DIAGNOSIS — I10 Essential (primary) hypertension: Secondary | ICD-10-CM

## 2013-03-11 DIAGNOSIS — Z8249 Family history of ischemic heart disease and other diseases of the circulatory system: Secondary | ICD-10-CM

## 2013-03-11 LAB — GLUCOSE, CAPILLARY
Glucose-Capillary: 293 mg/dL — ABNORMAL HIGH (ref 70–99)
Glucose-Capillary: 263 mg/dL — ABNORMAL HIGH (ref 70–99)

## 2013-03-11 LAB — COMPREHENSIVE METABOLIC PANEL
ALT: 12 U/L (ref 0–53)
AST: 14 U/L (ref 0–37)
Albumin: 4.1 g/dL (ref 3.5–5.2)
CO2: 19 mEq/L (ref 19–32)
Calcium: 9.7 mg/dL (ref 8.4–10.5)
Chloride: 104 mEq/L (ref 96–112)
Creatinine, Ser: 2.51 mg/dL — ABNORMAL HIGH (ref 0.50–1.35)
Sodium: 140 mEq/L (ref 135–145)
Total Bilirubin: 0.5 mg/dL (ref 0.3–1.2)

## 2013-03-11 LAB — URINALYSIS, ROUTINE W REFLEX MICROSCOPIC
Bilirubin Urine: NEGATIVE
Ketones, ur: 15 mg/dL — AB
Protein, ur: 100 mg/dL — AB
Urobilinogen, UA: 0.2 mg/dL (ref 0.0–1.0)

## 2013-03-11 LAB — POCT I-STAT 3, VENOUS BLOOD GAS (G3P V)
O2 Saturation: 37 %
TCO2: 25 mmol/L (ref 0–100)
pCO2, Ven: 51.4 mmHg — ABNORMAL HIGH (ref 45.0–50.0)
pO2, Ven: 25 mmHg — CL (ref 30.0–45.0)

## 2013-03-11 LAB — CBC WITH DIFFERENTIAL/PLATELET
Basophils Absolute: 0 10*3/uL (ref 0.0–0.1)
Basophils Relative: 0 % (ref 0–1)
HCT: 35.8 % — ABNORMAL LOW (ref 39.0–52.0)
Lymphocytes Relative: 11 % — ABNORMAL LOW (ref 12–46)
MCHC: 35.2 g/dL (ref 30.0–36.0)
Monocytes Absolute: 0.2 10*3/uL (ref 0.1–1.0)
Neutro Abs: 5.7 10*3/uL (ref 1.7–7.7)
Platelets: 155 10*3/uL (ref 150–400)
RDW: 12.4 % (ref 11.5–15.5)
WBC: 6.6 10*3/uL (ref 4.0–10.5)

## 2013-03-11 LAB — RAPID STREP SCREEN (MED CTR MEBANE ONLY): Streptococcus, Group A Screen (Direct): POSITIVE — AB

## 2013-03-11 LAB — URINE MICROSCOPIC-ADD ON

## 2013-03-11 MED ORDER — ACETAMINOPHEN 325 MG PO TABS: 650.0000 mg | ORAL_TABLET | Freq: Four times a day (QID) | ORAL | Status: DC | PRN

## 2013-03-11 MED ORDER — MENTHOL 3 MG MT LOZG
1.0000 | LOZENGE | OROMUCOSAL | Status: DC | PRN
  Administered 2013-03-19: 3 mg via ORAL
  Filled 2013-03-11: qty 9

## 2013-03-11 MED ORDER — INSULIN ASPART 100 UNIT/ML ~~LOC~~ SOLN
0.0000 [IU] | Freq: Every day | SUBCUTANEOUS | Status: DC
  Administered 2013-03-11 – 2013-03-16 (×3): 2 [IU] via SUBCUTANEOUS
  Administered 2013-03-18: 3 [IU] via SUBCUTANEOUS
  Administered 2013-03-19 – 2013-03-21 (×2): 2 [IU] via SUBCUTANEOUS

## 2013-03-11 MED ORDER — AMLODIPINE BESYLATE 5 MG PO TABS
5.0000 mg | ORAL_TABLET | Freq: Every day | ORAL | Status: DC
  Administered 2013-03-11 – 2013-03-14 (×4): 5 mg via ORAL
  Filled 2013-03-11 (×4): qty 1

## 2013-03-11 MED ORDER — HYDRALAZINE HCL 20 MG/ML IJ SOLN: 10.0000 mg | INTRAMUSCULAR | Status: DC | PRN

## 2013-03-11 MED ORDER — INSULIN ASPART 100 UNIT/ML ~~LOC~~ SOLN
0.0000 [IU] | Freq: Three times a day (TID) | SUBCUTANEOUS | Status: DC
  Administered 2013-03-12: 09:00:00 5 [IU] via SUBCUTANEOUS
  Administered 2013-03-12: 2 [IU] via SUBCUTANEOUS
  Administered 2013-03-12: 19:00:00 3 [IU] via SUBCUTANEOUS
  Administered 2013-03-13 – 2013-03-14 (×4): 2 [IU] via SUBCUTANEOUS
  Administered 2013-03-14: 13:00:00 via SUBCUTANEOUS
  Administered 2013-03-14: 08:00:00 1 [IU] via SUBCUTANEOUS
  Administered 2013-03-15 (×2): 2 [IU] via SUBCUTANEOUS
  Administered 2013-03-15: 19:00:00 1 [IU] via SUBCUTANEOUS
  Administered 2013-03-16: 09:00:00 2 [IU] via SUBCUTANEOUS
  Administered 2013-03-16: 5 [IU] via SUBCUTANEOUS
  Administered 2013-03-17: 09:00:00 3 [IU] via SUBCUTANEOUS
  Administered 2013-03-17: 17:00:00 2 [IU] via SUBCUTANEOUS
  Administered 2013-03-17: 5 [IU] via SUBCUTANEOUS
  Administered 2013-03-18 (×3): 3 [IU] via SUBCUTANEOUS
  Administered 2013-03-19: 17:00:00 2 [IU] via SUBCUTANEOUS
  Administered 2013-03-19: 13:00:00 5 [IU] via SUBCUTANEOUS
  Administered 2013-03-19: 09:00:00 3 [IU] via SUBCUTANEOUS
  Administered 2013-03-20: 09:00:00 2 [IU] via SUBCUTANEOUS
  Administered 2013-03-20 (×2): 5 [IU] via SUBCUTANEOUS
  Administered 2013-03-21 (×3): 3 [IU] via SUBCUTANEOUS
  Administered 2013-03-22: 2 [IU] via SUBCUTANEOUS
  Administered 2013-03-22: 9 [IU] via SUBCUTANEOUS

## 2013-03-11 MED ORDER — LABETALOL HCL 5 MG/ML IV SOLN
20.0000 mg | Freq: Once | INTRAVENOUS | Status: AC
  Administered 2013-03-11: 20 mg via INTRAVENOUS
  Filled 2013-03-11: qty 4

## 2013-03-11 MED ORDER — ASPIRIN 81 MG PO CHEW
81.0000 mg | CHEWABLE_TABLET | Freq: Every day | ORAL | Status: DC
  Administered 2013-03-11 – 2013-03-22 (×12): 81 mg via ORAL
  Filled 2013-03-11 (×12): qty 1

## 2013-03-11 MED ORDER — LIDOCAINE VISCOUS 2 % MT SOLN
15.0000 mL | OROMUCOSAL | Status: DC | PRN
  Filled 2013-03-11: qty 15

## 2013-03-11 MED ORDER — ONDANSETRON HCL 4 MG PO TABS
4.0000 mg | ORAL_TABLET | Freq: Four times a day (QID) | ORAL | Status: DC | PRN
  Administered 2013-03-19: 09:00:00 4 mg via ORAL
  Filled 2013-03-11: qty 1

## 2013-03-11 MED ORDER — SODIUM CHLORIDE 0.9 % IJ SOLN
3.0000 mL | Freq: Two times a day (BID) | INTRAMUSCULAR | Status: DC
  Administered 2013-03-12 – 2013-03-21 (×14): 3 mL via INTRAVENOUS

## 2013-03-11 MED ORDER — ONDANSETRON HCL 4 MG/2ML IJ SOLN
INTRAMUSCULAR | Status: AC
  Administered 2013-03-11: 4 mg via INTRAVENOUS
  Filled 2013-03-11: qty 2

## 2013-03-11 MED ORDER — PENICILLIN G BENZATHINE 1200000 UNIT/2ML IM SUSP
1.2000 10*6.[IU] | Freq: Once | INTRAMUSCULAR | Status: AC
  Administered 2013-03-11: 1.2 10*6.[IU] via INTRAMUSCULAR
  Filled 2013-03-11: qty 2

## 2013-03-11 MED ORDER — METOPROLOL TARTRATE 50 MG PO TABS
50.0000 mg | ORAL_TABLET | Freq: Two times a day (BID) | ORAL | Status: DC
  Administered 2013-03-11 – 2013-03-19 (×16): 50 mg via ORAL
  Filled 2013-03-11 (×17): qty 1

## 2013-03-11 MED ORDER — ACETAMINOPHEN 650 MG RE SUPP: 650.0000 mg | Freq: Four times a day (QID) | RECTAL | Status: DC | PRN

## 2013-03-11 MED ORDER — MORPHINE SULFATE 2 MG/ML IJ SOLN: 1.0000 mg | INTRAMUSCULAR | Status: DC | PRN

## 2013-03-11 MED ORDER — ONDANSETRON HCL 4 MG/2ML IJ SOLN
4.0000 mg | Freq: Once | INTRAMUSCULAR | Status: AC
  Administered 2013-03-11: 4 mg via INTRAVENOUS

## 2013-03-11 MED ORDER — GI COCKTAIL ~~LOC~~
30.0000 mL | Freq: Three times a day (TID) | ORAL | Status: DC | PRN
  Filled 2013-03-11: qty 30

## 2013-03-11 MED ORDER — SODIUM CHLORIDE 0.9 % IV SOLN
INTRAVENOUS | Status: AC
  Administered 2013-03-11: 20:00:00 via INTRAVENOUS

## 2013-03-11 MED ORDER — SODIUM CHLORIDE 0.9 % IV SOLN
INTRAVENOUS | Status: DC
  Administered 2013-03-11: 22:00:00 via INTRAVENOUS

## 2013-03-11 MED ORDER — PANTOPRAZOLE SODIUM 40 MG PO TBEC
40.0000 mg | DELAYED_RELEASE_TABLET | Freq: Every day | ORAL | Status: DC
  Administered 2013-03-11 – 2013-03-22 (×12): 40 mg via ORAL
  Filled 2013-03-11 (×9): qty 1

## 2013-03-11 MED ORDER — ISOSORBIDE MONONITRATE ER 60 MG PO TB24
60.0000 mg | ORAL_TABLET | Freq: Every day | ORAL | Status: DC
  Administered 2013-03-11 – 2013-03-19 (×9): 60 mg via ORAL
  Filled 2013-03-11 (×9): qty 1

## 2013-03-11 MED ORDER — ASPIRIN 81 MG PO TABS: 81.0000 mg | ORAL_TABLET | Freq: Every day | ORAL | Status: DC

## 2013-03-11 MED ORDER — SODIUM CHLORIDE 0.9 % IV BOLUS (SEPSIS)
1000.0000 mL | Freq: Once | INTRAVENOUS | Status: AC
  Administered 2013-03-11: 1000 mL via INTRAVENOUS

## 2013-03-11 MED ORDER — ONDANSETRON HCL 4 MG/2ML IJ SOLN: 4.0000 mg | Freq: Four times a day (QID) | INTRAMUSCULAR | Status: DC | PRN

## 2013-03-11 NOTE — ED Provider Notes (Signed)
CSN: [id number]     Arrival date & time 03/11/13  1538 History   First MD Initiated Contact with Patient 03/11/13 1541     Chief Complaint  Patient presents with  . Altered Mental Status   (Consider location/radiation/quality/duration/timing/severity/associated sxs/prior Treatment) HPI Comments: Patient presents from home with altered mental status for the past one day. Level V caveat applies. Family states she's not been eating or drinking, laying in bed and sleeping a lot and hard to arouse. He was seen 2 days ago after a fall versus syncopal episode and found to have a chronic subdural hematoma. Patient has been complaining of throat pain and ear drainage. Denies any fevers, chills, nausea or vomiting. No diarrhea. He denies any chest pain or shortness of breath. No focal weakness, numbness or tingling.  The history is provided by the patient and a relative. The history is limited by the condition of the patient.    Past Medical History  Diagnosis Date  . Coronary artery disease     post CABG in 1997 with abnormal nuclear stress test in 2007 which did demonstrate mild reversible ischemia and has been managed medically.   . Diabetes mellitus     type 2  . Hypertension   . Hyperlipidemia   . Hypercholesterolemia   . Atypical chest pain   . Hypernatremia   . History of hypoglycemia   . Syncope   . CVA (cerebral infarction)   . Allergy   . History of chicken pox   . Acute on chronic renal insufficiency   . CKD (chronic kidney disease)   . GERD (gastroesophageal reflux disease)   . H/O hiatal hernia    Past Surgical History  Procedure Laterality Date  . Coronary artery bypass graft  1997    x3 -- patent internal mammary graft to saphenous vein grafts x3  . Tonsillectomy    . Cardiac catheterization  01/26/1999    patent internal mammary graft to saphenous vein grafts x3 -- singnificant three-vessel coroanry artery disease -- potential sites of ischemia involving the intermediate  branch, circumflex branch and the distal LAD through diffuse disease, as well as steal type syndrome -- normal LV function -- W. Ashley Royalty., M.D.    . Esophagogastroduodenoscopy  07/22/2012    Procedure: ESOPHAGOGASTRODUODENOSCOPY (EGD);  Surgeon: Theda Belfast, MD;  Location: [facility];  Service: Endoscopy;  Laterality: N/A;  . Colonoscopy  07/22/2012    Procedure: COLONOSCOPY;  Surgeon: Theda Belfast, MD;  Location: [facility];  Service: Endoscopy;  Laterality: N/A;   Family History  Problem Relation Age of Onset  . Hypertension Other     Parent  . Diabetes Other     Parent  . Hyperlipidemia Other     Parent   History  Substance Use Topics  . Smoking status: Never Smoker   . Smokeless tobacco: Never Used  . Alcohol Use: No    Review of Systems  Unable to perform ROS: Mental status change    Allergies  Review of patient's allergies indicates no known allergies.  Home Medications   No current outpatient prescriptions on file. BP 194/103  Pulse 98  Temp(Src) 98.3 F (36.8 C) (Oral)  Resp 18  Ht 6\' 1"  (1.854 m)  Wt 174 lb 14.4 oz (79.334 kg)  BMI 23.08 kg/m2  SpO2 96% Physical Exam  Constitutional: He appears well-developed and well-nourished. No distress.  Somnolent, arousable to voice, follows commands  HENT:  Head: Normocephalic and atraumatic.  Mouth/Throat: Oropharynx  is clear and moist. No oropharyngeal exudate.  Dry mucous membranes  Eyes: Conjunctivae and EOM are normal. Pupils are equal, round, and reactive to light.  Neck: Normal range of motion. Neck supple.  Cardiovascular: Normal rate, regular rhythm and normal heart sounds.   No murmur heard. Pulmonary/Chest: Effort normal and breath sounds normal. No respiratory distress.  Abdominal: Soft. There is no tenderness. There is no rebound and no guarding.  Musculoskeletal: Normal range of motion. He exhibits no edema and no tenderness.  Neurological: He is alert. No cranial nerve deficit.   Oriented to person and place Moves extremities to commands.  Skin: Skin is warm.    ED Course  Procedures (including critical care time) Labs Review Labs Reviewed  RAPID STREP SCREEN - Abnormal; Notable for the following:    Streptococcus, Group A Screen (Direct) POSITIVE (*)    All other components within normal limits  GLUCOSE, CAPILLARY - Abnormal; Notable for the following:    Glucose-Capillary 293 (*)    All other components within normal limits  CBC WITH DIFFERENTIAL - Abnormal; Notable for the following:    RBC 3.95 (*)    Hemoglobin 12.6 (*)    HCT 35.8 (*)    Neutrophils Relative % 86 (*)    Lymphocytes Relative 11 (*)    Monocytes Relative 2 (*)    All other components within normal limits  COMPREHENSIVE METABOLIC PANEL - Abnormal; Notable for the following:    Glucose, Bld 287 (*)    BUN 31 (*)    Creatinine, Ser 2.51 (*)    GFR calc non Af Amer 22 (*)    GFR calc Af Amer 26 (*)    All other components within normal limits  URINALYSIS, ROUTINE W REFLEX MICROSCOPIC - Abnormal; Notable for the following:    Glucose, UA >1000 (*)    Hgb urine dipstick TRACE (*)    Ketones, ur 15 (*)    Protein, ur 100 (*)    All other components within normal limits  URINE MICROSCOPIC-ADD ON - Abnormal; Notable for the following:    Casts HYALINE CASTS (*)    All other components within normal limits  GLUCOSE, CAPILLARY - Abnormal; Notable for the following:    Glucose-Capillary 263 (*)    All other components within normal limits  CG4 I-STAT (LACTIC ACID) - Abnormal; Notable for the following:    Lactic Acid, Venous 2.60 (*)    All other components within normal limits  POCT I-STAT 3, BLOOD GAS (G3P V) - Abnormal; Notable for the following:    pCO2, Ven 51.4 (*)    pO2, Ven 25.0 (*)    Acid-base deficit 4.0 (*)    All other components within normal limits  CULTURE, EXPECTORATED SPUTUM-ASSESSMENT  PROTIME-INR  TROPONIN I  KETONES, QUALITATIVE  COMPREHENSIVE METABOLIC  PANEL  CBC  PROTIME-INR   Imaging Review Dg Chest 2 View  03/11/2013   *RADIOLOGY REPORT*  Clinical Data: Recent traumatic injury  CHEST - 2 VIEW  Comparison: 06/08/2012  Findings: Cardiac shadow is stable.  Postsurgical changes are again noted.  The lungs are well-aerated with left basilar atelectasis seen.  No other focal abnormality is noted.  IMPRESSION: Left basilar atelectasis.   Original Report Authenticated By: Alcide Clever, M.D.   Ct Head Wo Contrast  03/11/2013   *RADIOLOGY REPORT*  Clinical Data: Altered mental status  CT HEAD WITHOUT CONTRAST  Technique:  Contiguous axial images were obtained from the base of the skull through the vertex  without contrast.  Comparison: 03/09/2013  Findings: The bony calvarium is intact.  No gross soft tissue abnormality is noted.  Diffuse atrophic changes are noted. Subdural hygroma is again noted on the left and stable.  A chronic right-sided subdural hematoma is again identified but slightly better delineated on the current examination.  No significant increased attenuation is identified to suggest that this is an acute event.  Changes consistent with prior right occipital infarct and right parietal infarct are noted.  IMPRESSION: Subacute to chronic right subdural hematoma better delineated on the current exam.  It measures approximately 8 mm in greatest thickness.  No significant increased density is noted to suggest an active hemorrhage.  Chronic changes as described.   Original Report Authenticated By: Alcide Clever, M.D.    MDM   1. Dehydration   2. Strep pharyngitis   3. Hypertension    Altered mental status with recent subacute and chronic subdural hematoma.  CT head shows stable subacute right subdural hematoma. Patient appears dehydrated with worsening of his creatinine. Anion gap is 17. Does not appear to be in DKA at this point.  BIcillin given for strep pharyngitis. IVF fluids for dehydration and worsening of renal insufficiency.  Poor po  intake likely 2/ 2strep. cT Head appears stable. No evidence of active bleeding. Will admit for hydration and monitoring.   Date: 03/11/2013  Rate: 87  Rhythm: normal sinus rhythm  QRS Axis: normal  Intervals: normal  ST/T Wave abnormalities: normal  Conduction Disutrbances:none  Narrative Interpretation:   Old EKG Reviewed: unchanged     Glynn Octave, MD 03/12/13 [phone]

## 2013-03-11 NOTE — H&P (Signed)
[facility] History and Physical  Patient: [name]  AVW:[id number]  DOB: Jul 23, 1927  DOA: 03/11/2013  Referring physician: Dr Manus Gunning PCP: Nicki Reaper, NP   Chief Complaint: Altered mental status  HPI: [name] is a 79 y.o. male with Past medical history of coronary disease, diabetes, hypertension, dyslipidemia, CVA, CKD. The patient presented with the complaint of altered mental status. As per the family the patient was not taking anything orally since last to 3 days. Patient complains of sore throat and feeling weak and lethargic. He denies any focal neurological complaint. 2 days ago patient was found to have a fall, was presented to the ER and was found to have subdural hematoma. Due to the soreness of the throat the patient was not able to take his medications as well. Initially the patient was complaining of nausea but currently he says nausea has subsided.  Review of Systems: as mentioned in the history of present illness.  A Comprehensive review of the other systems is negative.  Past Medical History  Diagnosis Date  . Coronary artery disease     post CABG in 1997 with abnormal nuclear stress test in 2007 which did demonstrate mild reversible ischemia and has been managed medically.   . Diabetes mellitus     type 2  . Hypertension   . Hyperlipidemia   . Hypercholesterolemia   . Atypical chest pain   . Hypernatremia   . History of hypoglycemia   . Syncope   . CVA (cerebral infarction)   . Allergy   . History of chicken pox   . Acute on chronic renal insufficiency   . CKD (chronic kidney disease)   . GERD (gastroesophageal reflux disease)   . H/O hiatal hernia    Past Surgical History  Procedure Laterality Date  . Coronary artery bypass graft  1997    x3 -- patent internal mammary graft to saphenous vein grafts x3  . Tonsillectomy    . Cardiac catheterization  01/26/1999    patent internal mammary graft to saphenous vein grafts x3 -- singnificant  three-vessel coroanry artery disease -- potential sites of ischemia involving the intermediate branch, circumflex branch and the distal LAD through diffuse disease, as well as steal type syndrome -- normal LV function -- W. Ashley Royalty., M.D.    . Esophagogastroduodenoscopy  07/22/2012    Procedure: ESOPHAGOGASTRODUODENOSCOPY (EGD);  Surgeon: Theda Belfast, MD;  Location: [facility];  Service: Endoscopy;  Laterality: N/A;  . Colonoscopy  07/22/2012    Procedure: COLONOSCOPY;  Surgeon: Theda Belfast, MD;  Location: [facility];  Service: Endoscopy;  Laterality: N/A;   Social History:  reports that he has never smoked. He has never used smokeless tobacco. He reports that he does not drink alcohol or use illicit drugs. Patient is coming from home. Independent for most of his  ADL.  No Known Allergies  Family History  Problem Relation Age of Onset  . Hypertension Other     Parent  . Diabetes Other     Parent  . Hyperlipidemia Other     Parent    Prior to Admission medications   Medication Sig Start Date End Date Taking? Authorizing Provider  amLODipine (NORVASC) 5 MG tablet Take 1 tablet (5 mg total) by mouth daily. 10/02/12  Yes Nicki Reaper, NP  aspirin 81 MG tablet Take 81 mg by mouth daily.   Yes Historical Provider, MD  glipiZIDE (GLUCOTROL) 10 MG tablet Take 1 tablet (10 mg total) by mouth 2 (  two) times daily before a meal. 10/02/12  Yes Nicki Reaper, NP  isosorbide mononitrate (IMDUR) 60 MG 24 hr tablet Take 60 mg by mouth daily.   Yes Historical Provider, MD  losartan (COZAAR) 50 MG tablet Take 50 mg by mouth daily.   Yes Historical Provider, MD  metoprolol (LOPRESSOR) 50 MG tablet Take 50 mg by mouth 2 (two) times daily.   Yes Historical Provider, MD  omeprazole (PRILOSEC) 20 MG capsule Take 20 mg by mouth daily.   Yes Historical Provider, MD  traMADol (ULTRAM) 50 MG tablet Take 1 tablet (50 mg total) by mouth every 8 (eight) hours as needed for pain. 07/03/12  Yes Nicki Reaper, NP    Physical Exam: Filed Vitals:   03/11/13 1600 03/11/13 1630 03/11/13 1643 03/11/13 1700  BP: 207/97 215/104  211/114  Pulse: 86 84  89  Temp:   98 F (36.7 C)   TempSrc:   Rectal   Resp: 24 14  15   SpO2: 99% 98%  99%    General: Alert, Awake and Oriented to Time, Place and Person. Appear in mild distress Eyes: PERRL ENT: Oral Mucosa redness noted on the back dry. Neck: No  JVD, No  Carotid Bruits  Cardiovascular: S1 and S2 Present, no Murmur, Peripheral Pulses Present Respiratory: Bilateral Air entry equal and Decreased, Clear to Auscultation,   no Crackles, no  wheezes Abdomen: Bowel Sound Present, Soft and Non tender Skin: No  Rash Extremities:  no  Pedal edema,  to  calf tenderness Neurologic: Grossly Unremarkable.  Labs on Admission:  CBC:  Recent Labs Lab 03/09/13 2101 03/11/13 1637  WBC 5.4 6.6  NEUTROABS  --  5.7  HGB 11.8* 12.6*  HCT 34.0* 35.8*  MCV 90.7 90.6  PLT 151 155    CMP     Component Value Date/Time   NA 140 03/11/2013 1637   K 4.2 03/11/2013 1637   CL 104 03/11/2013 1637   CO2 19 03/11/2013 1637   GLUCOSE 287* 03/11/2013 1637   BUN 31* 03/11/2013 1637   CREATININE 2.51* 03/11/2013 1637   CREATININE 2.08* 06/08/2012 1538   CALCIUM 9.7 03/11/2013 1637   PROT 8.0 03/11/2013 1637   ALBUMIN 4.1 03/11/2013 1637   AST 14 03/11/2013 1637   ALT 12 03/11/2013 1637   ALKPHOS 98 03/11/2013 1637   BILITOT 0.5 03/11/2013 1637   GFRNONAA 22* 03/11/2013 1637   GFRAA 26* 03/11/2013 1637    No results found for this basename: LIPASE, AMYLASE,  in the last 168 hours No results found for this basename: AMMONIA,  in the last 168 hours  Cardiac Enzymes:  Recent Labs Lab 03/09/13 2101 03/11/13 1637  TROPONINI <0.30 <0.30    BNP (last 3 results) No results found for this basename: PROBNP,  in the last 8760 hours  Radiological Exams on Admission: Dg Chest 2 View  03/11/2013   *RADIOLOGY REPORT*  Clinical Data: Recent traumatic injury  CHEST - 2  VIEW  Comparison: 06/08/2012  Findings: Cardiac shadow is stable.  Postsurgical changes are again noted.  The lungs are well-aerated with left basilar atelectasis seen.  No other focal abnormality is noted.  IMPRESSION: Left basilar atelectasis.   Original Report Authenticated By: Alcide Clever, M.D.   Ct Head Wo Contrast  03/11/2013   *RADIOLOGY REPORT*  Clinical Data: Altered mental status  CT HEAD WITHOUT CONTRAST  Technique:  Contiguous axial images were obtained from the base of the skull through the vertex without contrast.  Comparison:  03/09/2013  Findings: The bony calvarium is intact.  No gross soft tissue abnormality is noted.  Diffuse atrophic changes are noted. Subdural hygroma is again noted on the left and stable.  A chronic right-sided subdural hematoma is again identified but slightly better delineated on the current examination.  No significant increased attenuation is identified to suggest that this is an acute event.  Changes consistent with prior right occipital infarct and right parietal infarct are noted.  IMPRESSION: Subacute to chronic right subdural hematoma better delineated on the current exam.  It measures approximately 8 mm in greatest thickness.  No significant increased density is noted to suggest an active hemorrhage.  Chronic changes as described.   Original Report Authenticated By: Alcide Clever, M.D.   Ct Head Wo Contrast  03/09/2013   *RADIOLOGY REPORT*  Clinical Data: Syncope.  Head injury  CT HEAD WITHOUT CONTRAST  Technique:  Contiguous axial images were obtained from the base of the skull through the vertex without contrast.  Comparison: 12/06/2011  Findings: There is diffuse patchy low density throughout the subcortical and periventricular white matter consistent with chronic small vessel ischemic change.  There is prominence of the sulci and ventricles consistent with brain atrophy. Encephalomalacia involving the left posterior parietal and occipital lobes appears similar  to previous exam.  Subacute on chronic right subdural hematoma is identified which measures up to 6 mm in thickness, image 23/series 2.  There is no significant mass effect upon the adjacent brain parenchyma.  Large, left sided subdural hygroma is unchanged from previous exam measuring up to 1.7 cm.  There is mild mucosal thickening involving the sphenoid sinus.  The mastoid air cells appear clear.  The skull is intact.  IMPRESSION:  1.  No acute intracranial abnormalities. 2.  Advance small vessel ischemic disease and brain atrophy. 3.  Subacute on chronic right subdural hematoma.   Original Report Authenticated By: Signa Kell, M.D.    EKG: Independently reviewed. nonspecific ST and T waves changes.  Assessment/Plan Principal Problem:   Strep sore throat Active Problems:   Ischemic heart disease   Diabetes mellitus type 2, insulin dependent   Dehydration   CKD (chronic kidney disease)   Syncope   SDH (subdural hematoma)   1. Strep sore throat  the patient presented with dehydration sore throat difficulty speaking , and inability to take his medication. CT scan of the head does not show any acute abnormality and shows chronic stable subdural hematoma. Patient's current presentation is likely secondary to sore throat. Streptococcus antigen is positive. Patient will be given IV Unasyn for treatment of the same. IV fluids will be given. Lozenges and Lidocaine oral gargles will be given for soothing.  2. altered mental status Patient does not appear to have any new neurological deficit. His CT scan is stable at present. Continue neurochecks.  3.CKD Mildly worse, likely prerenal, continue IV hydration.  4. diabetes mellitus  Sliding scale only.  DVT Prophylaxis: mechanical compression device Nutrition:  as tolerated   Code Status:  full   Family Communication:  family was at bedside    Author: Lynden Oxford, MD [facility] Pager: [phone] 03/11/2013, 7:47 PM    If  7PM-7AM, please contact night-coverage [email or web address] Password [email or web address]

## 2013-03-11 NOTE — ED Notes (Signed)
Pt states his "throat is real sore" and states that is why he is not speaking much.

## 2013-03-11 NOTE — ED Notes (Signed)
Critical labs shown to Dr.Rancour

## 2013-03-11 NOTE — ED Notes (Signed)
Dr. Rancour at bedside. 

## 2013-03-11 NOTE — ED Notes (Addendum)
Pt from home via [facility] with altered level of consciousness. Pt fell and hit head on Monday, was seen at [facility], had staples placed and was sent home. Pt awoke this morning and was lethargic, non-verbal, and would not eat/take meds.  Pt is arousable and will follow commands. Pt given 4mg  Zofran IV PTA d/t pt gagging.

## 2013-03-11 NOTE — ED Notes (Signed)
Phlebotomy at bedside.

## 2013-03-11 NOTE — ED Notes (Signed)
Patient transported to CT 

## 2013-03-11 NOTE — ED Notes (Addendum)
Pt transferred to radiology. Hospitalist re-paged by [employer]

## 2013-03-11 NOTE — ED Notes (Signed)
Pt had episode of nausea. EDP aware.

## 2013-03-11 NOTE — ED Notes (Signed)
EDP at bedside  

## 2013-03-12 LAB — COMPREHENSIVE METABOLIC PANEL
AST: 14 U/L (ref 0–37)
Albumin: 3.8 g/dL (ref 3.5–5.2)
BUN: 29 mg/dL — ABNORMAL HIGH (ref 6–23)
Creatinine, Ser: 2.3 mg/dL — ABNORMAL HIGH (ref 0.50–1.35)
Potassium: 4.1 mEq/L (ref 3.5–5.1)
Total Protein: 7.7 g/dL (ref 6.0–8.3)

## 2013-03-12 LAB — GLUCOSE, CAPILLARY: Glucose-Capillary: 213 mg/dL — ABNORMAL HIGH (ref 70–99)

## 2013-03-12 LAB — CBC
HCT: 35.7 % — ABNORMAL LOW (ref 39.0–52.0)
MCH: 31 pg (ref 26.0–34.0)
MCV: 89.3 fL (ref 78.0–100.0)
RBC: 4 MIL/uL — ABNORMAL LOW (ref 4.22–5.81)
WBC: 8.1 10*3/uL (ref 4.0–10.5)

## 2013-03-12 MED ORDER — SODIUM CHLORIDE 0.9 % IV SOLN
INTRAVENOUS | Status: DC
  Administered 2013-03-12 – 2013-03-13 (×3): via INTRAVENOUS

## 2013-03-12 MED ORDER — SODIUM CHLORIDE 0.9 % IV SOLN
Freq: Once | INTRAVENOUS | Status: AC
  Administered 2013-03-12: 11:00:00 via INTRAVENOUS

## 2013-03-12 MED ORDER — AMOXICILLIN 250 MG PO CAPS
250.0000 mg | ORAL_CAPSULE | Freq: Two times a day (BID) | ORAL | Status: DC
  Administered 2013-03-12 – 2013-03-17 (×10): 250 mg via ORAL
  Filled 2013-03-12 (×11): qty 1

## 2013-03-12 NOTE — Progress Notes (Signed)
Inpatient Diabetes Program Recommendations  AACE/ADA: New Consensus Statement on Inpatient Glycemic Control (2013)  Target Ranges:  Prepandial:   less than 140 mg/dL      Peak postprandial:   less than 180 mg/dL (1-2 hours)      Critically ill patients:  140 - 180 mg/dL   Results for [name], [name] (MRN [id number]) as of 03/12/2013 14:23  Ref. Range 03/09/2013 20:53 03/11/2013 15:56 03/11/2013 22:34 03/12/2013 08:37 03/12/2013 12:50  Glucose-Capillary Latest Range: 70-99 mg/dL 409 (H) 811 (H) 914 (H) 252 (H) 181 (H)    Inpatient Diabetes Program Recommendations Insulin - Basal: If correction insulin increased to moderate scale and blood glucose continues to be greater than 200 mg/d, may want to order low dose basal insulin while inpatient. Correction (SSI): Please consider increasing Novolog correction to moderate scale. HgbA1C: Please consider ordering an A1C.  Last A1C in the chart was 9.6% on 07/03/2012.  Thanks, Orlando Penner, RN, MSN, CCRN Diabetes Coordinator Inpatient Diabetes Program [phone] (Team Pager) [phone] ([facility] office) [phone] [facility] office)

## 2013-03-12 NOTE — Progress Notes (Addendum)
[facility] PROGRESS NOTE  [name] YNW:[id number] DOB: March 03, 1928 DOA: 03/11/2013 PCP: Nicki Reaper, NP  In brief patient is is an 79 year old gentleman with a past medical history of coronary artery disease, dyslipidemia, hypertension, chronic kidney disease, who had a fall 2 days ago bumping his head on his fireplace. Imaging studies performed in emergent department  showed a subacute on chronic right subdural hematoma.  He was discharged home from the emergency department. Patient was admitted overnight, as of phalanx have reporting minimal by mouth intake as well as some altered mental status over the last several days. A repeat CT scan showed subacute on chronic right subdural hematoma, measuring 8 mm in greatest thickness. There was no specific increased density noted to suggest active hemorrhage. Patient this morning reporting feeling "sick" especially going from laying to sitting. He was orthostatic, and bolused with a liter of normal saline. On my reassessment, family members reporting that he appears better.               Assessment/Plan:  Mental status changes Family members had reported increased confusion at home. He had a fall 2 days ago after which she presented to the emergent apartment, CT scan showed subacute on chronic right subdural hematoma. A repeat noncontrasted scan of brain overnight did not reveal significant changes. Radiology reporting that there was no significant increased density noted to suggest an active hemorrhage. Family members reporting to me that he "occasionally gets confused" and that he seemed to improve after receiving IV fluids.  Orthostatic hypotension Patient's systolic blood pressures falling from 155-112 from laying to standing. I have bolused with 1 L of normal saline, and will continue running fluids at 125 mL per hour. Family members reporting that he has had minimal by mouth intake over the last several days. We'll continue encouraging oral  intake, IV fluid resuscitation, and repeat his orthostatics tomorrow morning.  Strep pharyngitis Patient reported sore throat, with strep antigen come back positive. Continue amoxicillin therapy.  Acute on chronic kidney disease. Patient's creatinine improving from 2.5 on yesterday's lab work to 2.3 on this mornings lab work, I suspect secondary to prerenal azotemia in setting of the profound dehydration. Continue IV fluid resuscitation  Diabetes mellitus Continue Accu-Cheks q. a.c. and each bedtime with vascular cover  Code Status: Full Family Communication: Plan discussed with patient, daughter and mother present at bedside Disposition Plan: Will continue hydrating him with IV fluids, repeat orthostatics in a.m.    Antibiotics:  amoxicillin     Objective: Filed Vitals:   03/12/13 0848  BP: 131/99  Pulse: 87  Temp:   Resp:     Intake/Output Summary (Last 24 hours) at 03/12/13 1430 Last data filed at 03/11/13 2025  Gross per 24 hour  Intake      0 ml  Output    725 ml  Net   -725 ml   Filed Weights   03/11/13 2118  Weight: 79.334 kg (174 lb 14.4 oz)    Exam:   General:  Patient in no acute distress, reported feeling sick when sitting up from his bed.  Cardiovascular: Regular rate rhythm normal S1-S2  Respiratory: Clear to auscultation bilaterally no wheezing rhonchi or rales  Abdomen: Soft nontender nondistended positive bowel  Musculoskeletal: Preserved range of motion to all extremities   Data Reviewed: Basic Metabolic Panel:  Recent Labs Lab 03/09/13 2101 03/11/13 1637 03/12/13 0448  NA 138 140 142  K 4.6 4.2 4.1  CL 106 104 107  CO2 18*  19 22  GLUCOSE 307* 287* 246*  BUN 30* 31* 29*  CREATININE 2.48* 2.51* 2.30*  CALCIUM 9.0 9.7 9.6   Liver Function Tests:  Recent Labs Lab 03/11/13 1637 03/12/13 0448  AST 14 14  ALT 12 10  ALKPHOS 98 92  BILITOT 0.5 0.7  PROT 8.0 7.7  ALBUMIN 4.1 3.8   No results found for this basename:  LIPASE, AMYLASE,  in the last 168 hours No results found for this basename: AMMONIA,  in the last 168 hours CBC:  Recent Labs Lab 03/09/13 2101 03/11/13 1637 03/12/13 0448  WBC 5.4 6.6 8.1  NEUTROABS  --  5.7  --   HGB 11.8* 12.6* 12.4*  HCT 34.0* 35.8* 35.7*  MCV 90.7 90.6 89.3  PLT 151 155 177   Cardiac Enzymes:  Recent Labs Lab 03/09/13 2101 03/11/13 1637  TROPONINI <0.30 <0.30   BNP (last 3 results) No results found for this basename: PROBNP,  in the last 8760 hours CBG:  Recent Labs Lab 03/09/13 2053 03/11/13 1556 03/11/13 2234 03/12/13 0837 03/12/13 1250  GLUCAP 295* 293* 263* 252* 181*    Recent Results (from the past 240 hour(s))  RAPID STREP SCREEN     Status: Abnormal   Collection Time    03/11/13  4:16 PM      Result Value Range Status   Streptococcus, Group A Screen (Direct) POSITIVE (*) NEGATIVE Final     Studies: Dg Chest 2 View  03/11/2013   *RADIOLOGY REPORT*  Clinical Data: Recent traumatic injury  CHEST - 2 VIEW  Comparison: 06/08/2012  Findings: Cardiac shadow is stable.  Postsurgical changes are again noted.  The lungs are well-aerated with left basilar atelectasis seen.  No other focal abnormality is noted.  IMPRESSION: Left basilar atelectasis.   Original Report Authenticated By: Alcide Clever, M.D.   Ct Head Wo Contrast  03/11/2013   *RADIOLOGY REPORT*  Clinical Data: Altered mental status  CT HEAD WITHOUT CONTRAST  Technique:  Contiguous axial images were obtained from the base of the skull through the vertex without contrast.  Comparison: 03/09/2013  Findings: The bony calvarium is intact.  No gross soft tissue abnormality is noted.  Diffuse atrophic changes are noted. Subdural hygroma is again noted on the left and stable.  A chronic right-sided subdural hematoma is again identified but slightly better delineated on the current examination.  No significant increased attenuation is identified to suggest that this is an acute event.  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  IMPRESSION: Subacute to chronic right subdural hematoma better delineated on the current exam.  It measures approximately 8 mm in greatest thickness.  No significant increased density is noted to suggest an active hemorrhage.  Chronic changes as described.   Original Report Authenticated By: Alcide Clever, M.D.    Scheduled Meds: . amLODipine  5 mg Oral Daily  . aspirin  81 mg Oral Daily  . insulin aspart  0-5 Units Subcutaneous QHS  . insulin aspart  0-9 Units Subcutaneous TID WC  . isosorbide mononitrate  60 mg Oral Daily  . metoprolol  50 mg Oral BID  . pantoprazole  40 mg Oral Daily  . sodium chloride  3 mL Intravenous Q12H   Continuous Infusions: . sodium chloride      Principal Problem:   Strep sore throat Active Problems:   Ischemic heart disease   Diabetes mellitus type 2, insulin dependent   Dehydration   CKD (chronic  kidney disease)   Syncope   SDH (subdural hematoma)    Time spent: 35 min    Jeralyn Bennett  [facility] Pager [phone]. If 7PM-7AM, please contact night-coverage at [email or web address], password [facility] 03/12/2013, 2:30 PM  LOS: 1 day   Addendum Patient having a history of Stage IV CKD, as his GFR at baseline is 26-30.

## 2013-03-12 NOTE — Clinical Documentation Improvement (Signed)
THIS DOCUMENT IS NOT A PERMANENT PART OF THE MEDICAL RECORD  Please update your documentation with the medical record to reflect your response to this query. If you need help knowing how to do this please call [phone].  03/12/13   To  Dr. Bea Laura. [facility],  In a better effort to capture your patient's severity of illness, reflect appropriate length of stay and utilization of resources, a review of the patient medical record has revealed the following indicators.    Based on your clinical judgment, please clarify and document in a progress note and/or discharge summary the clinical condition associated with the following supporting information:  In responding to this query please exercise your independent judgment.  The fact that a query is asked, does not imply that any particular answer is desired or expected.  Per progress notes documentation of "CKD"  If possible please provide the stage of the "CKD" if known.  Thank you   Possible Clinical Conditions?   CKD Stage I -  GFR > OR = 90  CKD Stage II - GFR 60-80  CKD Stage III - GFR 30-59  CKD Stage IV - GFR 15-29  CKD Stage V - GFR < 15  ESRD (End Stage Renal Disease)  Other condition  Cannot Clinically determine      Treatment: IV  NS @ 125 ml/hr ,  CMP, BMET's   daily  You may use possible, probable, or suspect with inpatient documentation. possible, probable, suspected diagnoses MUST be documented at the time of discharge  Reviewed: a history of Stage IV CKD,     Thank Claud Kelp Breeona Waid  Clinical Documentation Specialist: [phone] [facility]

## 2013-03-13 DIAGNOSIS — I10 Essential (primary) hypertension: Secondary | ICD-10-CM

## 2013-03-13 DIAGNOSIS — N189 Chronic kidney disease, unspecified: Secondary | ICD-10-CM

## 2013-03-13 DIAGNOSIS — Z794 Long term (current) use of insulin: Secondary | ICD-10-CM

## 2013-03-13 DIAGNOSIS — R4182 Altered mental status, unspecified: Secondary | ICD-10-CM

## 2013-03-13 DIAGNOSIS — J020 Streptococcal pharyngitis: Principal | ICD-10-CM

## 2013-03-13 DIAGNOSIS — E860 Dehydration: Secondary | ICD-10-CM

## 2013-03-13 DIAGNOSIS — I951 Orthostatic hypotension: Secondary | ICD-10-CM

## 2013-03-13 DIAGNOSIS — E119 Type 2 diabetes mellitus without complications: Secondary | ICD-10-CM

## 2013-03-13 DIAGNOSIS — E785 Hyperlipidemia, unspecified: Secondary | ICD-10-CM

## 2013-03-13 DIAGNOSIS — I119 Hypertensive heart disease without heart failure: Secondary | ICD-10-CM

## 2013-03-13 LAB — CBC
MCH: 30.8 pg (ref 26.0–34.0)
MCHC: 34 g/dL (ref 30.0–36.0)
MCV: 90.6 fL (ref 78.0–100.0)
Platelets: 200 10*3/uL (ref 150–400)
RDW: 12.9 % (ref 11.5–15.5)

## 2013-03-13 LAB — BASIC METABOLIC PANEL
CO2: 20 mEq/L (ref 19–32)
Calcium: 9.7 mg/dL (ref 8.4–10.5)
Creatinine, Ser: 2.21 mg/dL — ABNORMAL HIGH (ref 0.50–1.35)
GFR calc Af Amer: 30 mL/min — ABNORMAL LOW (ref >90)

## 2013-03-13 LAB — GLUCOSE, CAPILLARY: Glucose-Capillary: 198 mg/dL — ABNORMAL HIGH (ref 70–99)

## 2013-03-13 NOTE — Progress Notes (Signed)
[facility] PROGRESS NOTE  [name] ZOX:[id number] DOB: 17-Jun-1928 DOA: 03/11/2013 PCP: Nicki Reaper, NP  In brief patient is is an 79 year old gentleman with a past medical history of coronary artery disease, dyslipidemia, hypertension, chronic kidney disease, who had a fall 2 days ago bumping his head on his fireplace. Imaging studies performed in emergent department  showed a subacute on chronic right subdural hematoma.  He was discharged home from the emergency department. Patient was admitted overnight, as of phalanx have reporting minimal by mouth intake as well as some altered mental status over the last several days. A repeat CT scan showed subacute on chronic right subdural hematoma, measuring 8 mm in greatest thickness. There was no specific increased density noted to suggest active hemorrhage. Patient this morning reporting feeling "sick" especially going from laying to sitting. He was orthostatic, and bolused with a liter of normal saline. On my reassessment, family members reporting that he appears better.               Assessment/Plan:  Mental status changes Family members had reported increased confusion at home. He had a fall 2 days ago after which she presented to the emergent apartment, CT scan showed subacute on chronic right subdural hematoma. A repeat noncontrasted scan of brain overnight did not reveal significant changes. Radiology reporting that there was no significant increased density noted to suggest an active hemorrhage. Family members reporting to me that he "occasionally gets confused" and that he seemed to improve after receiving IV fluids.  Orthostatic hypotension Improved with IVF rehydration - have nursing encourage po fluid intake - placed order for proper orthostatic vital check.  Strep pharyngitis Patient reported sore throat, with strep antigen come back positive. Continue amoxicillin therapy.  Acute on chronic kidney disease. Patient's creatinine  improving from 2.5 on yesterday's lab work to 2.3 on this mornings lab work, I suspect secondary to prerenal azotemia in setting of the profound dehydration.  - As such improved with IVF rehydration. Will saline lock and have nursing encourage po fluid intake.  Diabetes mellitus - continue SSI - Carb modified diet  Patient has a history of Stage IV CKD, as his GFR at baseline is 26-30.   Code Status: Full Family Communication: Plan discussed with patient, daughter and mother present at bedside Disposition Plan: Will continue hydrating him with IV fluids, repeat orthostatics in a.m.    Antibiotics:  amoxicillin     Objective: Filed Vitals:   03/13/13 1800  BP: 157/90  Pulse: 62  Temp: 98.2 F (36.8 C)  Resp: 18    Intake/Output Summary (Last 24 hours) at 03/13/13 1923 Last data filed at 03/13/13 1800  Gross per 24 hour  Intake 1983.33 ml  Output   1675 ml  Net 308.33 ml   Filed Weights   03/11/13 2118 03/12/13 2122  Weight: 79.334 kg (174 lb 14.4 oz) 80.2 kg (176 lb 12.9 oz)    Exam:   General:  Patient in no acute distress, alert and awake  Cardiovascular: Regular rate rhythm normal S1-S2  Respiratory: Clear to auscultation bilaterally no wheezing rhonchi or increased wob  Abdomen: Soft nontender nondistended positive bowel  Musculoskeletal: no cyanosis or clubbing  Data Reviewed: Basic Metabolic Panel:  Recent Labs Lab 03/09/13 2101 03/11/13 1637 03/12/13 0448 03/13/13 0650  NA 138 140 142 144  K 4.6 4.2 4.1 3.8  CL 106 104 107 108  CO2 18* 19 22 20   GLUCOSE 307* 287* 246* 222*  BUN 30* 31* 29*  30*  CREATININE 2.48* 2.51* 2.30* 2.21*  CALCIUM 9.0 9.7 9.6 9.7   Liver Function Tests:  Recent Labs Lab 03/11/13 1637 03/12/13 0448  AST 14 14  ALT 12 10  ALKPHOS 98 92  BILITOT 0.5 0.7  PROT 8.0 7.7  ALBUMIN 4.1 3.8   No results found for this basename: LIPASE, AMYLASE,  in the last 168 hours No results found for this basename:  AMMONIA,  in the last 168 hours CBC:  Recent Labs Lab 03/09/13 2101 03/11/13 1637 03/12/13 0448 03/13/13 0650  WBC 5.4 6.6 8.1 8.9  NEUTROABS  --  5.7  --   --   HGB 11.8* 12.6* 12.4* 12.8*  HCT 34.0* 35.8* 35.7* 37.6*  MCV 90.7 90.6 89.3 90.6  PLT 151 155 177 200   Cardiac Enzymes:  Recent Labs Lab 03/09/13 2101 03/11/13 1637  TROPONINI <0.30 <0.30   BNP (last 3 results) No results found for this basename: PROBNP,  in the last 8760 hours CBG:  Recent Labs Lab 03/12/13 1838 03/12/13 2127 03/13/13 0823 03/13/13 1151 03/13/13 1731  GLUCAP 213* 196* 198* 178* 155*    Recent Results (from the past 240 hour(s))  RAPID STREP SCREEN     Status: Abnormal   Collection Time    03/11/13  4:16 PM      Result Value Range Status   Streptococcus, Group A Screen (Direct) POSITIVE (*) NEGATIVE Final     Studies: No results found.  Scheduled Meds: . amLODipine  5 mg Oral Daily  . amoxicillin  250 mg Oral Q12H  . aspirin  81 mg Oral Daily  . insulin aspart  0-5 Units Subcutaneous QHS  . insulin aspart  0-9 Units Subcutaneous TID WC  . isosorbide mononitrate  60 mg Oral Daily  . metoprolol  50 mg Oral BID  . pantoprazole  40 mg Oral Daily  . sodium chloride  3 mL Intravenous Q12H   Continuous Infusions:    Principal Problem:   Strep sore throat Active Problems:   Ischemic heart disease   Diabetes mellitus type 2, insulin dependent   Dehydration   CKD (chronic kidney disease)   Syncope   SDH (subdural hematoma)    Time spent: 35 min    [name]  [facility] Pager [phone]. If 7PM-7AM, please contact night-coverage at [email or web address], password [facility] 03/13/2013, 7:23 PM  LOS: 2 days

## 2013-03-14 DIAGNOSIS — I951 Orthostatic hypotension: Secondary | ICD-10-CM

## 2013-03-14 DIAGNOSIS — I6200 Nontraumatic subdural hemorrhage, unspecified: Secondary | ICD-10-CM

## 2013-03-14 DIAGNOSIS — R4182 Altered mental status, unspecified: Secondary | ICD-10-CM

## 2013-03-14 LAB — GLUCOSE, CAPILLARY
Glucose-Capillary: 150 mg/dL — ABNORMAL HIGH (ref 70–99)
Glucose-Capillary: 243 mg/dL — ABNORMAL HIGH (ref 70–99)
Glucose-Capillary: 242 mg/dL — ABNORMAL HIGH (ref 70–99)

## 2013-03-14 NOTE — Progress Notes (Signed)
[facility] PROGRESS NOTE  [name] YNW:[id number] DOB: March 25, 1928 DOA: 03/11/2013 PCP: Nicki Reaper, NP From prior note In brief patient is is an 79 year old gentleman with a past medical history of coronary artery disease, dyslipidemia, hypertension, chronic kidney disease, who had a fall 2 days ago bumping his head on his fireplace. Imaging studies performed in emergent department  showed a subacute on chronic right subdural hematoma.  He was discharged home from the emergency department. Patient was admitted overnight, as of phalanx have reporting minimal by mouth intake as well as some altered mental status over the last several days. A repeat CT scan showed subacute on chronic right subdural hematoma, measuring 8 mm in greatest thickness. There was no specific increased density noted to suggest active hemorrhage. Patient this morning reporting feeling "sick" especially going from laying to sitting. He was orthostatic, and bolused with a liter of normal saline.   Assessment/Plan:  Mental status changes Family members had reported increased confusion at home. He had a fall 2 days ago after which she presented to the emergent apartment, CT scan showed subacute on chronic right subdural hematoma. A repeat noncontrasted scan of brain overnight did not reveal significant changes. Radiology reporting that there was no significant increased density noted to suggest an active hemorrhage. Family members reporting to me that he "occasionally gets confused" and that he seemed to improve after receiving IV fluids.  Orthostatic hypotension Improved with IVF rehydration - have nursing to continue encouraging po fluid intake - placed order for proper orthostatic vital check.  According to vital signs this AM patient still orthostatic.  Will have physical therapy evaluate patient next am.   - Last recorded blood pressure 128/76.  Will discontinue amlodipine at this juncture. - Will place ted hose and  recommend compression stockings once patient transitions home.  Strep pharyngitis Patient reported sore throat, with strep antigen come back positive. Continue amoxicillin therapy. Today being day 4 of abx therapy  Acute on chronic kidney disease. Patient's creatinine improving from 2.5 on yesterday's lab work to 2.3 on this mornings lab work, I suspect secondary to prerenal azotemia in setting of the profound dehydration.  - As such improved with IVF rehydration. Will saline lock and have nursing encourage po fluid intake. Continues to improve at this juncture.  Diabetes mellitus - continue SSI - Carb modified diet  Patient has a history of Stage IV CKD, as his GFR at baseline is 26-30.   Code Status: Full Family Communication: Plan discussed with patient, daughter and mother present at bedside Disposition Plan: Will await recommendations from physical therapy. Likely home in 1-2 days.   Antibiotics:  amoxicillin   Objective: Filed Vitals:   03/14/13 1330  BP: 128/76  Pulse: 66  Temp: 98.2 F (36.8 C)  Resp: 18    Intake/Output Summary (Last 24 hours) at 03/14/13 1833 Last data filed at 03/14/13 1700  Gross per 24 hour  Intake    960 ml  Output    650 ml  Net    310 ml   Filed Weights   03/11/13 2118 03/12/13 2122  Weight: 79.334 kg (174 lb 14.4 oz) 80.2 kg (176 lb 12.9 oz)    Exam:   General:  Patient in no acute distress, alert and awake  Cardiovascular: Regular rate rhythm normal S1-S2  Respiratory: Clear to auscultation bilaterally no wheezing rhonchi or increased wob  Abdomen: Soft nontender nondistended positive bowel  Musculoskeletal: no cyanosis or clubbing  Data Reviewed: Basic Metabolic Panel:  Recent Labs Lab 03/09/13 2101 03/11/13 1637 03/12/13 0448 03/13/13 0650  NA 138 140 142 144  K 4.6 4.2 4.1 3.8  CL 106 104 107 108  CO2 18* 19 22 20   GLUCOSE 307* 287* 246* 222*  BUN 30* 31* 29* 30*  CREATININE 2.48* 2.51* 2.30* 2.21*   CALCIUM 9.0 9.7 9.6 9.7   Liver Function Tests:  Recent Labs Lab 03/11/13 1637 03/12/13 0448  AST 14 14  ALT 12 10  ALKPHOS 98 92  BILITOT 0.5 0.7  PROT 8.0 7.7  ALBUMIN 4.1 3.8   No results found for this basename: LIPASE, AMYLASE,  in the last 168 hours No results found for this basename: AMMONIA,  in the last 168 hours CBC:  Recent Labs Lab 03/09/13 2101 03/11/13 1637 03/12/13 0448 03/13/13 0650  WBC 5.4 6.6 8.1 8.9  NEUTROABS  --  5.7  --   --   HGB 11.8* 12.6* 12.4* 12.8*  HCT 34.0* 35.8* 35.7* 37.6*  MCV 90.7 90.6 89.3 90.6  PLT 151 155 177 200   Cardiac Enzymes:  Recent Labs Lab 03/09/13 2101 03/11/13 1637  TROPONINI <0.30 <0.30   BNP (last 3 results) No results found for this basename: PROBNP,  in the last 8760 hours CBG:  Recent Labs Lab 03/13/13 1151 03/13/13 1731 03/13/13 2313 03/14/13 1322 03/14/13 1710  GLUCAP 178* 155* 150* 243* 163*    Recent Results (from the past 240 hour(s))  RAPID STREP SCREEN     Status: Abnormal   Collection Time    03/11/13  4:16 PM      Result Value Range Status   Streptococcus, Group A Screen (Direct) POSITIVE (*) NEGATIVE Final     Studies: No results found.  Scheduled Meds: . amLODipine  5 mg Oral Daily  . amoxicillin  250 mg Oral Q12H  . aspirin  81 mg Oral Daily  . insulin aspart  0-5 Units Subcutaneous QHS  . insulin aspart  0-9 Units Subcutaneous TID WC  . isosorbide mononitrate  60 mg Oral Daily  . metoprolol  50 mg Oral BID  . pantoprazole  40 mg Oral Daily  . sodium chloride  3 mL Intravenous Q12H   Continuous Infusions:    Principal Problem:   Strep sore throat Active Problems:   Ischemic heart disease   Diabetes mellitus type 2, insulin dependent   Dehydration   CKD (chronic kidney disease)   Syncope   SDH (subdural hematoma)    Time spent: 35 min    [name]  [facility] Pager [phone]. If 7PM-7AM, please contact night-coverage at [email or web address], password  [facility] 03/14/2013, 6:33 PM  LOS: 3 days

## 2013-03-15 ENCOUNTER — Inpatient Hospital Stay (HOSPITAL_COMMUNITY): Payer: Medicare PPO

## 2013-03-15 ENCOUNTER — Encounter (HOSPITAL_COMMUNITY): Payer: Self-pay | Admitting: *Deleted

## 2013-03-15 DIAGNOSIS — G255 Other chorea: Secondary | ICD-10-CM

## 2013-03-15 DIAGNOSIS — R55 Syncope and collapse: Secondary | ICD-10-CM

## 2013-03-15 DIAGNOSIS — I6200 Nontraumatic subdural hemorrhage, unspecified: Secondary | ICD-10-CM

## 2013-03-15 LAB — BASIC METABOLIC PANEL
CO2: 22 mEq/L (ref 19–32)
Calcium: 8.9 mg/dL (ref 8.4–10.5)
Creatinine, Ser: 2.76 mg/dL — ABNORMAL HIGH (ref 0.50–1.35)
GFR calc Af Amer: 23 mL/min — ABNORMAL LOW (ref >90)
GFR calc non Af Amer: 20 mL/min — ABNORMAL LOW (ref >90)

## 2013-03-15 LAB — GLUCOSE, CAPILLARY
Glucose-Capillary: 195 mg/dL — ABNORMAL HIGH (ref 70–99)
Glucose-Capillary: 196 mg/dL — ABNORMAL HIGH (ref 70–99)
Glucose-Capillary: 142 mg/dL — ABNORMAL HIGH (ref 70–99)
Glucose-Capillary: 187 mg/dL — ABNORMAL HIGH (ref 70–99)

## 2013-03-15 MED ORDER — INFLUENZA VAC SPLIT QUAD 0.5 ML IM SUSP
0.5000 mL | INTRAMUSCULAR | Status: AC
  Administered 2013-03-16: 12:00:00 0.5 mL via INTRAMUSCULAR
  Filled 2013-03-15: qty 0.5

## 2013-03-15 MED ORDER — SODIUM CHLORIDE 0.9 % IV SOLN
INTRAVENOUS | Status: DC
  Administered 2013-03-15 – 2013-03-16 (×4): via INTRAVENOUS

## 2013-03-15 NOTE — Progress Notes (Signed)
Inpatient Diabetes Program Recommendations  AACE/ADA: New Consensus Statement on Inpatient Glycemic Control (2013)  Target Ranges:  Prepandial:   less than 140 mg/dL      Peak postprandial:   less than 180 mg/dL (1-2 hours)      Critically ill patients:  140 - 180 mg/dL   Reason for Visit: Results for EMET, RAFANAN (MRN [id number]) as of 03/15/2013 11:51  Ref. Range 03/13/2013 23:13 03/14/2013 13:22 03/14/2013 17:10 03/14/2013 22:13 03/15/2013 08:39  Glucose-Capillary Latest Range: 70-99 mg/dL 562 (H) 130 (H) 865 (H) 242 (H) 195 (H)   Consider adding Levemir 10 units daily while in the hospital.  Also please check A1C to determine pre-hospitalization glycemic control.  Will follow.  Beryl Meager, RN, BC-ADM Inpatient Diabetes Coordinator Pager [phone]

## 2013-03-15 NOTE — Evaluation (Addendum)
Physical Therapy Evaluation Patient Details Name: [name] MRN: [id number] DOB: 1927/11/16 Today's Date: 03/15/2013 Time: 6295-2841 PT Time Calculation (min): 32 min  PT Assessment / Plan / Recommendation History of Present Illness  In brief patient is is an 79 year old gentleman with a past medical history of coronary artery disease, dyslipidemia, hypertension, chronic kidney disease, who had a fall 2 days ago bumping his head on his fireplace. Imaging studies performed in emergent department  showed a subacute on chronic right subdural hematoma.  He was discharged home from the emergency department. Patient was admitted overnight, as of phalanx have reporting minimal by mouth intake as well as some altered mental status over the last several days. A repeat CT scan showed subacute on chronic right subdural hematoma, measuring 8 mm in greatest thickness. There was no specific increased density noted to suggest active hemorrhage. Patient this morning reporting feeling "sick" especially going from laying to sitting. He was orthostatic, and bolused with a liter of normal saline. Pt also positive for strep throat.  Clinical Impression  Pt admitted with above. Pt currently with functional limitations due to the deficits listed below (see PT Problem List). Pt with choreiform type movements that are limiting functional mobility. Pt will benefit from skilled PT to increase their independence and safety with mobility to allow discharge to the venue listed below.       PT Assessment  Patient needs continued PT services    Follow Up Recommendations  SNF    Does the patient have the potential to tolerate intense rehabilitation      Barriers to Discharge        Equipment Recommendations  Rolling walker with 5" wheels    Recommendations for Other Services OT consult   Frequency Min 3X/week    Precautions / Restrictions Precautions Precautions: Fall   Pertinent Vitals/Pain Pt reports abd pain but  unable to rate and appears comfortable resting in bed.      Mobility  Bed Mobility Bed Mobility: Right Sidelying to Sit;Sit to Sidelying Right;Sitting - Scoot to Edge of Bed Right Sidelying to Sit: 4: Min assist Sitting - Scoot to Edge of Bed: 4: Min assist Sit to Sidelying Right: 4: Min assist Details for Bed Mobility Assistance: Assist to bring trunk up as well as guide legs. Transfers Transfers: Sit to Stand;Stand to Sit Sit to Stand: 4: Min assist;With upper extremity assist;From bed Stand to Sit: 4: Min assist;With upper extremity assist;To bed Details for Transfer Assistance: Assist for balance and control. Ambulation/Gait Ambulation/Gait Assistance: Not tested (comment)    Exercises     PT Diagnosis: Difficulty walking;Generalized weakness;Altered mental status  PT Problem List: Decreased strength;Decreased activity tolerance;Decreased balance;Decreased mobility;Decreased coordination;Decreased cognition;Decreased knowledge of use of DME;Decreased safety awareness;Decreased knowledge of precautions PT Treatment Interventions: DME instruction;Gait training;Functional mobility training;Therapeutic activities;Therapeutic exercise;Balance training;Cognitive remediation;Patient/family education     PT Goals(Current goals can be found in the care plan section) Acute Rehab PT Goals Patient Stated Goal: Pt didn't state PT Goal Formulation: Patient unable to participate in goal setting Time For Goal Achievement: 03/22/13 Potential to Achieve Goals: Good  Visit Information  Last PT Received On: 03/15/13 Assistance Needed: +2 (to try gait) History of Present Illness: In brief patient is is an 79 year old gentleman with a past medical history of coronary artery disease, dyslipidemia, hypertension, chronic kidney disease, who had a fall 2 days ago bumping his head on his fireplace. Imaging studies performed in emergent department  showed a subacute on chronic right subdural hematoma.  He was discharged home from the emergency department. Patient was admitted overnight, as of phalanx have reporting minimal by mouth intake as well as some altered mental status over the last several days. A repeat CT scan showed subacute on chronic right subdural hematoma, measuring 8 mm in greatest thickness. There was no specific increased density noted to suggest active hemorrhage. Patient this morning reporting feeling "sick" especially going from laying to sitting. He was orthostatic, and bolused with a liter of normal saline. Pt also positive for strep throat.       Prior Functioning  Home Living Family/patient expects to be discharged to:: Private residence Living Arrangements: Spouse/significant other Available Help at Discharge: Family Type of Home: House Home Access: Stairs to enter Secretary/administrator of Steps: 6 Entrance Stairs-Rails: Can reach both;Left;Right Home Layout: One level Home Equipment: None Prior Function Level of Independence: Independent Communication Communication: HOH Dominant Hand: Right    Cognition  Cognition Arousal/Alertness: Awake/alert Overall Cognitive Status: No family/caregiver present to determine baseline cognitive functioning Area of Impairment: Orientation;Problem solving Orientation Level: Time;Situation Problem Solving: Difficulty sequencing;Requires verbal cues;Requires tactile cues    Extremity/Trunk Assessment Lower Extremity Assessment Lower Extremity Assessment: Generalized weakness;LLE deficits/detail RLE Deficits / Details: Pt with choreiform type movements. RLE Coordination: decreased fine motor;decreased gross motor LLE Deficits / Details: Pt with choreiform type movements. LLE Coordination: decreased fine motor;decreased gross motor   Balance Balance Balance Assessed: Yes Static Sitting Balance Static Sitting - Balance Support: No upper extremity supported;Feet supported Static Sitting - Level of Assistance: 4: Min  assist;5: Stand by assistance Static Sitting - Comment/# of Minutes: Pt with choreiform type movements of extremities causing loss of balance in sitting. Static Standing Balance Static Standing - Balance Support: Bilateral upper extremity supported Static Standing - Level of Assistance: 4: Min assist Static Standing - Comment/# of Minutes: Stood x 1 minute and demonstrated uncontrolled random movements.  End of Session PT - End of Session Equipment Utilized During Treatment: Gait belt Activity Tolerance: Patient limited by fatigue Patient left: in bed;with call bell/phone within reach;with bed alarm set Nurse Communication: Mobility status  GP     [facility] 03/15/2013, 9:31 AM  [facility] PT [phone]

## 2013-03-15 NOTE — Consult Note (Addendum)
NEURO HOSPITALIST CONSULT NOTE    Reason for Consult: chorea  HPI:                                                                                                                                          [name] is an 79 y.o. male with a past medical history significant for HTN, hyperlipidemia, DM, CAD s/p CABG, stroke, CKD, syncope, GERD, admitted to [facility] with altered mental status following a fall 2 days prior. Work up in the hospital revealed a subacute to chronic right SDH. Neurology consulted regarding apparent new onset choreiform movements. Family is at bedside and said that they are not aware of any type of abnormal body movements prior to this hospitalization, and they stressed the fact that they are not seen " anything wrong with his movements" at this moment. No family history of movement disorders. In any case, his physical therapist reported choreiform movements limiting functional mobility.  At this moment, he is exhibiting multisegmental, random, purposeless, involuntary, uncontrollable hyperkinetic movements involving upper and lower extremities, and mouth. Denies headache, vertigo, double vision, difficulty swallowing, focal weakness or numbness, slurred speech, language or visual impairment.  Family reports mild decline in short term memory and gait changes.    Past Medical History  Diagnosis Date  . Coronary artery disease     post CABG in 1997 with abnormal nuclear stress test in 2007 which did demonstrate mild reversible ischemia and has been managed medically.   . Diabetes mellitus     type 2  . Hypertension   . Hyperlipidemia   . Hypercholesterolemia   . Atypical chest pain   . Hypernatremia   . History of hypoglycemia   . Syncope   . CVA (cerebral infarction)   . Allergy   . History of chicken pox   . Acute on chronic renal insufficiency   . CKD (chronic kidney disease)   . GERD (gastroesophageal reflux disease)   . H/O hiatal hernia      Past Surgical History  Procedure Laterality Date  . Coronary artery bypass graft  1997    x3 -- patent internal mammary graft to saphenous vein grafts x3  . Tonsillectomy    . Cardiac catheterization  01/26/1999    patent internal mammary graft to saphenous vein grafts x3 -- singnificant three-vessel coroanry artery disease -- potential sites of ischemia involving the intermediate branch, circumflex branch and the distal LAD through diffuse disease, as well as steal type syndrome -- normal LV function -- W. Ashley Royalty., M.D.    . Esophagogastroduodenoscopy  07/22/2012    Procedure: ESOPHAGOGASTRODUODENOSCOPY (EGD);  Surgeon: Theda Belfast, MD;  Location: [facility];  Service: Endoscopy;  Laterality: N/A;  . Colonoscopy  07/22/2012    Procedure: COLONOSCOPY;  Surgeon: Theda Belfast, MD;  Location: [facility];  Service: Endoscopy;  Laterality: N/A;    Family History  Problem Relation Age of Onset  . Hypertension Other     Parent  . Diabetes Other     Parent  . Hyperlipidemia Other     Parent     Social History:  reports that he has never smoked. He has never used smokeless tobacco. He reports that he does not drink alcohol or use illicit drugs.  No Known Allergies  MEDICATIONS:                                                                                                                     I have reviewed the patient's current medications.   ROS:                                                                                                                                       History obtained from the patient, family, and chart review.  General ROS: negative for - chills, fatigue, fever, night sweats, weight gain or weight loss Psychological ROS: negative for - behavioral disorder, hallucinations, mood swings or suicidal ideation Ophthalmic ROS: negative for - blurry vision, double vision, eye pain or loss of vision ENT ROS: negative for - epistaxis, nasal  discharge, oral lesions, sore throat, tinnitus or vertigo Allergy and Immunology ROS: negative for - hives or itchy/watery eyes Hematological and Lymphatic ROS: negative for - bleeding problems, bruising or swollen lymph nodes Endocrine ROS: negative for - galactorrhea, hair pattern changes, polydipsia/polyuria or temperature intolerance Respiratory ROS: negative for - cough, hemoptysis, shortness of breath or wheezing Cardiovascular ROS: negative for - chest pain, dyspnea on exertion, edema or irregular heartbeat Gastrointestinal ROS: negative for - abdominal pain, diarrhea, hematemesis, nausea/vomiting or stool incontinence Genito-Urinary ROS: negative for - dysuria, hematuria, incontinence or urinary frequency/urgency Musculoskeletal ROS: negative for - joint swelling or muscular weakness Neurological ROS: as noted in HPI Dermatological ROS: negative for rash and skin lesion changes   Physical exam: pleasant male in no apparent distress. Blood pressure 137/83, pulse 69, temperature 98.6 F (37 C), temperature source Oral, resp. rate 18, height 6\' 1"  (1.854 m), weight 79.652 kg (175 lb 9.6 oz), SpO2 100.00%. Head: normocephalic. Neck: supple, no bruits, no JVD. Cardiac: no murmurs. Lungs: clear. Abdomen: soft, no tender, no mass. Extremities: no edema.   Neurologic Examination:  Mental Status: Alert, awake, oriented to place and person, thought content appropriate.  Speech fluent without evidence of aphasia.  Able to follow 3 step commands without difficulty. Cranial Nerves: II: Discs flat bilaterally; Visual fields grossly normal, pupils equal, round, reactive to light and accommodation III,IV, VI: ptosis not present, extra-ocular motions intact bilaterally V,VII: smile symmetric, facial light touch sensation normal bilaterally VIII: hearing normal bilaterally IX,X: gag reflex  present XI: bilateral shoulder shrug XII: midline tongue extension Motor: Right : Upper extremity   5/5    Left:     Upper extremity   5/5  Lower extremity   5/5     Lower extremity   5/5 Tone and bulk:normal tone throughout; no atrophy noted Sensory: Pinprick and light touch intact throughout, bilaterally Deep Tendon Reflexes:  Right: Upper Extremity   Left: Upper extremity   biceps (C-5 to C-6) 2/4   biceps (C-5 to C-6) 2/4 tricep (C7) 2/4    triceps (C7) 2/4 Brachioradialis (C6) 2/4  Brachioradialis (C6) 2/4  Lower Extremity Lower Extremity  quadriceps (L-2 to L-4) 2/4   quadriceps (L-2 to L-4) 2/4 Achilles (S1) 2/4   Achilles (S1) 2/4  Plantars: Right: downgoing   Left: downgoing Coordination: normal finger-to-nose but impaired heel-to-shin test. Random, purposeless, involuntary, uncontrollable, hyperkinetic movements. Gait:  No tested. CV: pulses palpable throughout    Lab Results  Component Value Date/Time   CHOL 117 07/03/2012  3:47 PM    Results for orders placed during the hospital encounter of 03/11/13 (from the past 48 hour(s))  GLUCOSE, CAPILLARY     Status: Abnormal   Collection Time    03/13/13  5:31 PM      Result Value Range   Glucose-Capillary 155 (*) 70 - 99 mg/dL  GLUCOSE, CAPILLARY     Status: Abnormal   Collection Time    03/13/13 11:13 PM      Result Value Range   Glucose-Capillary 150 (*) 70 - 99 mg/dL  GLUCOSE, CAPILLARY     Status: Abnormal   Collection Time    03/14/13  1:22 PM      Result Value Range   Glucose-Capillary 243 (*) 70 - 99 mg/dL  GLUCOSE, CAPILLARY     Status: Abnormal   Collection Time    03/14/13  5:10 PM      Result Value Range   Glucose-Capillary 163 (*) 70 - 99 mg/dL  GLUCOSE, CAPILLARY     Status: Abnormal   Collection Time    03/14/13 10:13 PM      Result Value Range   Glucose-Capillary 242 (*) 70 - 99 mg/dL  BASIC METABOLIC PANEL     Status: Abnormal   Collection Time    03/15/13  7:20 AM      Result Value  Range   Sodium 142  135 - 145 mEq/L   Potassium 3.9  3.5 - 5.1 mEq/L   Chloride 108  96 - 112 mEq/L   CO2 22  19 - 32 mEq/L   Glucose, Bld 176 (*) 70 - 99 mg/dL   BUN 50 (*) 6 - 23 mg/dL   Creatinine, Ser 0.86 (*) 0.50 - 1.35 mg/dL   Calcium 8.9  8.4 - 57.8 mg/dL   GFR calc non Af Amer 20 (*) >90 mL/min   GFR calc Af Amer 23 (*) >90 mL/min   Comment: (NOTE)     The eGFR has been calculated using the CKD EPI equation.     This calculation has not been  validated in all clinical situations.     eGFR's persistently <90 mL/min signify possible Chronic Kidney     Disease.  GLUCOSE, CAPILLARY     Status: Abnormal   Collection Time    03/15/13  8:39 AM      Result Value Range   Glucose-Capillary 195 (*) 70 - 99 mg/dL   Comment 1 Notify RN     Comment 2 Documented in Chart    GLUCOSE, CAPILLARY     Status: Abnormal   Collection Time    03/15/13  1:17 PM      Result Value Range   Glucose-Capillary 196 (*) 70 - 99 mg/dL    No results found.   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 Acute onset chorea?.  In this particular scenario the first thing to do is to exclude an acute cerebrovascular event involving the BG. He is a diabetic but controlled glycemia and no evidence of strict hemichorea speaks against DM as the potential etiology. Doesn't seem to be provoked chorea related to medications. In sum, get MRI-DWI and then pursue further serological testing to try to identify the cause of his hyperkinetic movement disorder.   Wyatt Portela, MD [facility] [phone]  03/15/2013, 1:42 PM

## 2013-03-15 NOTE — Progress Notes (Signed)
[facility] PROGRESS NOTE  Balian Schaller NWG:[id number] DOB: 1928/02/18 DOA: 03/11/2013 PCP: Nicki Reaper, NP From prior note In brief patient is is an 79 year old gentleman with a past medical history of coronary artery disease, dyslipidemia, hypertension, chronic kidney disease, who had a fall 2 days ago bumping his head on his fireplace. Imaging studies performed in emergent department  showed a subacute on chronic right subdural hematoma.  He was discharged home from the emergency department. Patient was admitted overnight, as of phalanx have reporting minimal by mouth intake as well as some altered mental status over the last several days. A repeat CT scan showed subacute on chronic right subdural hematoma, measuring 8 mm in greatest thickness. There was no specific increased density noted to suggest active hemorrhage. Patient this morning reporting feeling "sick" especially going from laying to sitting. Patient has been orthostatic.   Assessment/Plan:  Mental status changes and now with choreiform movements upon standing Family members had reported increased confusion at home. He had a fall 2 days ago after which she presented to the emergent apartment, CT scan showed subacute on chronic right subdural hematoma. A repeat noncontrasted scan of brain overnight did not reveal significant changes. Radiology reporting that there was no significant increased density noted to suggest an active hemorrhage.  - Per family on last discussion patient at baseline - consulted Neurology. Plans are for MRI to further assess neurological function.  Orthostatic hypotension - have nursing to continue encouraging po fluid intake - patient continues to remain orthostatic - Amlodipine held - Will place ted hose and recommend compression stockings once patient transitions home.  Strep pharyngitis Patient reported sore throat, with strep antigen come back positive. Continue amoxicillin therapy. Today being  day 5 of abx therapy  Acute on chronic kidney disease. - suspect decrease in oral intake - Will plan on hydrating overnight and reassessing creatinine next am.  Diabetes mellitus - continue SSI - Carb modified diet  Patient has a history of Stage IV CKD, as his GFR at baseline is 77-30.   Code Status: Full Family Communication: Plan discussed with patient, daughter and mother present at bedside Disposition Plan: Given Choreiform movement will plan on consulting neurology for further evaluation and recommendations.   Antibiotics:  amoxicillin   Objective: Filed Vitals:   03/15/13 0852  BP: 137/83  Pulse: 69  Temp:   Resp:     Intake/Output Summary (Last 24 hours) at 03/15/13 1541 Last data filed at 03/14/13 1700  Gross per 24 hour  Intake    360 ml  Output    200 ml  Net    160 ml   Filed Weights   03/11/13 2118 03/12/13 2122 03/14/13 2210  Weight: 79.334 kg (174 lb 14.4 oz) 80.2 kg (176 lb 12.9 oz) 79.652 kg (175 lb 9.6 oz)    Exam:   General:  Patient in no acute distress, alert and awake  Cardiovascular: Regular rate rhythm normal S1-S2  Respiratory: Clear to auscultation bilaterally no wheezing rhonchi or increased wob  Abdomen: Soft nontender nondistended positive bowel  Musculoskeletal: no cyanosis or clubbing  Data Reviewed: Basic Metabolic Panel:  Recent Labs Lab 03/09/13 2101 03/11/13 1637 03/12/13 0448 03/13/13 0650 03/15/13 0720  NA 138 140 142 144 142  K 4.6 4.2 4.1 3.8 3.9  CL 106 104 107 108 108  CO2 18* 19 22 20 22   GLUCOSE 307* 287* 246* 222* 176*  BUN 30* 31* 29* 30* 50*  CREATININE 2.48* 2.51* 2.30* 2.21* 2.76*  CALCIUM 9.0 9.7 9.6 9.7 8.9   Liver Function Tests:  Recent Labs Lab 03/11/13 1637 03/12/13 0448  AST 14 14  ALT 12 10  ALKPHOS 98 92  BILITOT 0.5 0.7  PROT 8.0 7.7  ALBUMIN 4.1 3.8   No results found for this basename: LIPASE, AMYLASE,  in the last 168 hours No results found for this basename: AMMONIA,   in the last 168 hours CBC:  Recent Labs Lab 03/09/13 2101 03/11/13 1637 03/12/13 0448 03/13/13 0650  WBC 5.4 6.6 8.1 8.9  NEUTROABS  --  5.7  --   --   HGB 11.8* 12.6* 12.4* 12.8*  HCT 34.0* 35.8* 35.7* 37.6*  MCV 90.7 90.6 89.3 90.6  PLT 151 155 177 200   Cardiac Enzymes:  Recent Labs Lab 03/09/13 2101 03/11/13 1637  TROPONINI <0.30 <0.30   BNP (last 3 results) No results found for this basename: PROBNP,  in the last 8760 hours CBG:  Recent Labs Lab 03/14/13 1322 03/14/13 1710 03/14/13 2213 03/15/13 0839 03/15/13 1317  GLUCAP 243* 163* 242* 195* 196*    Recent Results (from the past 240 hour(s))  RAPID STREP SCREEN     Status: Abnormal   Collection Time    03/11/13  4:16 PM      Result Value Range Status   Streptococcus, Group A Screen (Direct) POSITIVE (*) NEGATIVE Final     Studies: No results found.  Scheduled Meds: . amoxicillin  250 mg Oral Q12H  . aspirin  81 mg Oral Daily  . [START ON 03/16/2013] influenza vac split quadrivalent PF  0.5 mL Intramuscular Tomorrow-1000  . insulin aspart  0-5 Units Subcutaneous QHS  . insulin aspart  0-9 Units Subcutaneous TID WC  . isosorbide mononitrate  60 mg Oral Daily  . metoprolol  50 mg Oral BID  . pantoprazole  40 mg Oral Daily  . sodium chloride  3 mL Intravenous Q12H   Continuous Infusions:    Principal Problem:   Strep sore throat Active Problems:   Ischemic heart disease   Diabetes mellitus type 2, insulin dependent   Dehydration   CKD (chronic kidney disease)   Syncope   HTN (hypertension)   SDH (subdural hematoma)    Time spent: 35 min    [name]  [facility] Pager [phone]. If 7PM-7AM, please contact night-coverage at [email or web address], password [facility] 03/15/2013, 3:41 PM  LOS: 4 days

## 2013-03-15 NOTE — Progress Notes (Signed)
Chaplain visited pt as a result of referral from another chaplain.  Pt was very alert and engaging with his smile and conversation.  Chaplain offered a prayer.  The pt expressed his appreciation for the visit.   03/15/13 2000  Clinical Encounter Type  Visited With Patient  Visit Type Spiritual support    [name]

## 2013-03-16 LAB — GLUCOSE, CAPILLARY
Glucose-Capillary: 167 mg/dL — ABNORMAL HIGH (ref 70–99)
Glucose-Capillary: 240 mg/dL — ABNORMAL HIGH (ref 70–99)

## 2013-03-16 MED ORDER — SENNA 8.6 MG PO TABS
1.0000 | ORAL_TABLET | Freq: Every day | ORAL | Status: DC | PRN
  Administered 2013-03-16 – 2013-03-22 (×2): 8.6 mg via ORAL
  Filled 2013-03-16 (×2): qty 1

## 2013-03-16 NOTE — Progress Notes (Signed)
[facility] PROGRESS NOTE  [name] ZOX:[id number] DOB: 29-Oct-1927 DOA: 03/11/2013 PCP: Nicki Reaper, NP From prior note In brief patient is is an 79 year old gentleman with a past medical history of coronary artery disease, dyslipidemia, hypertension, chronic kidney disease, who had a fall 2 days ago bumping his head on his fireplace. Imaging studies performed in emergent department  showed a subacute on chronic right subdural hematoma.  He was discharged home from the emergency department. Patient was admitted overnight, as of phalanx have reporting minimal by mouth intake as well as some altered mental status over the last several days. A repeat CT scan showed subacute on chronic right subdural hematoma, measuring 8 mm in greatest thickness. There was no specific increased density noted to suggest active hemorrhage.   This AM, patient reports that he feels okay, but at times feels bad and describes it as "just sick" when asked about details. Pt reports dizziness when standing with PT today and standing BP was unattainable.  Family/ RN reports that choreiform movements have improved today compared to yesterday .    Assessment/Plan:  Mental status changes and now with choreiform movements upon standing Family members had reported increased confusion at home. He had a fall 2 days ago after which she presented to the emergent apartment, CT scan showed subacute on chronic right subdural hematoma. A repeat noncontrasted scan of brain overnight did not reveal significant changes. Radiology reporting that there was no significant increased density noted to suggest an active hemorrhage.  - Per family on last discussion patient at baseline - consulted Neurology. Plans are for MRI to further assess neurological function.  Orthostatic hypotension - have nursing to continue encouraging po fluid intake - patient continues to remain orthostatic - patient became dizzy after working with PT and  standing bp unable to obtain .  - Amlodipine continued to be held - currently wearing ted hose and recommend compression stockings once patient transitions home.  Strep pharyngitis Patient continues to report sore throat, with strep antigen come back positive. Continue amoxicillin therapy. Today being day 6 of abx therapy  Acute on chronic kidney disease. - suspect decrease in oral intake - Will decrease IVF to 50 cc/hr  -Will recheck creatinine next am.  Diabetes mellitus - continue SSI - Carb modified diet  Patient has a history of Stage IV CKD, as his GFR at baseline is 26-30.   Code Status: Full Family Communication: Plan discussed with patient, daughter and mother present at bedside Disposition Plan: Given Choreiform movement will plan on consulting neurology for further evaluation and recommendations.   Antibiotics:  amoxicillin   Objective: Filed Vitals:   03/16/13 1434  BP: 129/72  Pulse: 59  Temp: 98.3 F (36.8 C)  Resp: 20    Intake/Output Summary (Last 24 hours) at 03/16/13 1545 Last data filed at 03/16/13 1435  Gross per 24 hour  Intake 2283.75 ml  Output   1750 ml  Net 533.75 ml   Filed Weights   03/12/13 2122 03/14/13 2210 03/16/13 0437  Weight: 80.2 kg (176 lb 12.9 oz) 79.652 kg (175 lb 9.6 oz) 79.652 kg (175 lb 9.6 oz)    Exam:   General:  Patient sitting in chair with  no acute distress. Pt alert and oriented to person, place, and time.   Cardiovascular: Regular rate rhythm normal S1-S2,  No MRG.  Radial and pedal pulses 2+ with no cyanosis or edema present.   Respiratory: Regular , unlabored, Clear to auscultation bilaterally with no wheezing  rhonchi   Abdomen: Soft nontender non distended. Bowel sounds x4  Musculoskeletal:Pt moves all extremities equally with spontaneous choreiform movements.   Data Reviewed: Basic Metabolic Panel:  Recent Labs Lab 03/09/13 2101 03/11/13 1637 03/12/13 0448 03/13/13 0650 03/15/13 0720  NA 138  140 142 144 142  K 4.6 4.2 4.1 3.8 3.9  CL 106 104 107 108 108  CO2 18* 19 22 20 22   GLUCOSE 307* 287* 246* 222* 176*  BUN 30* 31* 29* 30* 50*  CREATININE 2.48* 2.51* 2.30* 2.21* 2.76*  CALCIUM 9.0 9.7 9.6 9.7 8.9   Liver Function Tests:  Recent Labs Lab 03/11/13 1637 03/12/13 0448  AST 14 14  ALT 12 10  ALKPHOS 98 92  BILITOT 0.5 0.7  PROT 8.0 7.7  ALBUMIN 4.1 3.8   No results found for this basename: LIPASE, AMYLASE,  in the last 168 hours No results found for this basename: AMMONIA,  in the last 168 hours CBC:  Recent Labs Lab 03/09/13 2101 03/11/13 1637 03/12/13 0448 03/13/13 0650  WBC 5.4 6.6 8.1 8.9  NEUTROABS  --  5.7  --   --   HGB 11.8* 12.6* 12.4* 12.8*  HCT 34.0* 35.8* 35.7* 37.6*  MCV 90.7 90.6 89.3 90.6  PLT 151 155 177 200   Cardiac Enzymes:  Recent Labs Lab 03/09/13 2101 03/11/13 1637  TROPONINI <0.30 <0.30   BNP (last 3 results) No results found for this basename: PROBNP,  in the last 8760 hours CBG:  Recent Labs Lab 03/15/13 1317 03/15/13 1818 03/15/13 2214 03/16/13 0732 03/16/13 1141  GLUCAP 196* 142* 187* 167* 277*    Recent Results (from the past 240 hour(s))  RAPID STREP SCREEN     Status: Abnormal   Collection Time    03/11/13  4:16 PM      Result Value Range Status   Streptococcus, Group A Screen (Direct) POSITIVE (*) NEGATIVE Final     Studies: [name]  03/15/2013   *RADIOLOGY REPORT*  Clinical Data: Altered mental status.  Chorea  MRI HEAD WITHOUT CONTRAST  Technique:  Multiplanar, multiecho pulse sequences of the brain and surrounding structures were obtained according to standard protocol without intravenous contrast.  Comparison: CT 03/11/2013, MRI 12/06/2011  Findings: Negative for acute infarct.  Chronic right parietal infarct.  Chronic microvascular ischemic change in the white matter.  Negative for brainstem or cerebellar infarct.  There is a moderate atrophy.  Negative for hydrocephalus. Chronic micro  hemorrhage right posterior insula is unchanged.  15 mm subdural CSF hygroma unchanged from the prior study.  8 mm right frontal hygroma also unchanged.  Complex fluid collection right parietal lobe measures 8 mm.  These are unchanged from the recent CT but increased from the MRI of 12/06/2011.  No midline shift.  IMPRESSION: Negative for acute infarct.  15 mm CSF hygroma on the left is unchanged.  Complex subdural fluid collection right parietal lobe unchanged from the recent CT but increased in size from 12/06/2011.  Right frontal subdural hygroma unchanged from the prior MRI.   Original Report Authenticated By: Janeece Riggers, M.D.    Scheduled Meds: . amoxicillin  250 mg Oral Q12H  . aspirin  81 mg Oral Daily  . insulin aspart  0-5 Units Subcutaneous QHS  . insulin aspart  0-9 Units Subcutaneous TID WC  . isosorbide mononitrate  60 mg Oral Daily  . metoprolol  50 mg Oral BID  . pantoprazole  40 mg Oral Daily  . sodium chloride  3 mL Intravenous Q12H   Continuous Infusions: . sodium chloride 125 mL/hr at 03/16/13 1156    Principal Problem:   Strep sore throat Active Problems:   Ischemic heart disease   Diabetes mellitus type 2, insulin dependent   Dehydration   CKD (chronic kidney disease)   Syncope   HTN (hypertension)   SDH (subdural hematoma)   Chorea    Time spent: 35 min    [name]  [facility] Pager [phone]. If 7PM-7AM, please contact night-coverage at [email or web address], password [facility] 03/16/2013, 3:45 PM  LOS: 5 days

## 2013-03-16 NOTE — Progress Notes (Signed)
Physical Therapy Treatment Patient Details Name: [name] MRN: [id number] DOB: Nov 15, 1927 Today's Date: 03/16/2013 Time: 0865-7846 PT Time Calculation (min): 23 min  PT Assessment / Plan / Recommendation  History of Present Illness In brief patient is is an 79 year old gentleman with a past medical history of coronary artery disease, dyslipidemia, hypertension, chronic kidney disease, who had a fall 2 days ago bumping his head on his fireplace. Imaging studies performed in emergent department  showed a subacute on chronic right subdural hematoma.  He was discharged home from the emergency department. Patient was admitted overnight, as of phalanx have reporting minimal by mouth intake as well as some altered mental status over the last several days. A repeat CT scan showed subacute on chronic right subdural hematoma, measuring 8 mm in greatest thickness. There was no specific increased density noted to suggest active hemorrhage. Patient this morning reporting feeling "sick" especially going from laying to sitting. He was orthostatic, and bolused with a liter of normal saline. Pt also positive for strep throat.   PT Comments   Pt with less random purposeless movements but becomes very lightheaded when standing.  Only able to tolerate standing x 30-40 sec.  Unable to stand long enough to get standing BP.  Follow Up Recommendations  SNF     Does the patient have the potential to tolerate intense rehabilitation     Barriers to Discharge        Equipment Recommendations  Rolling walker with 5" wheels    Recommendations for Other Services OT consult  Frequency Min 3X/week   Progress towards PT Goals Progress towards PT goals: Not progressing toward goals - comment (due to orthostatic)  Plan Current plan remains appropriate    Precautions / Restrictions Precautions Precautions: Fall Precaution Comments: orthostatic   Pertinent Vitals/Pain BP 141/68 in sitting.    Mobility  Transfers Sit  to Stand: 4: Min assist;With upper extremity assist;From chair/3-in-1;With armrests Stand to Sit: 4: Min assist;With upper extremity assist;To chair/3-in-1;With armrests Details for Transfer Assistance: Assist for balance and control. Ambulation/Gait Ambulation/Gait Assistance: Not tested (comment) (Pt becomes too lightheaded in standing to attempt.)    Exercises General Exercises - Lower Extremity Ankle Circles/Pumps: AROM;Both;10 reps Long Arc Quad: AROM;Both;10 reps;Seated   PT Diagnosis:    PT Problem List:   PT Treatment Interventions:     PT Goals (current goals can now be found in the care plan section)    Visit Information  Last PT Received On: 03/16/13 Assistance Needed: +2 (to try amb (due to orthostatic)) History of Present Illness: In brief patient is is an 79 year old gentleman with a past medical history of coronary artery disease, dyslipidemia, hypertension, chronic kidney disease, who had a fall 2 days ago bumping his head on his fireplace. Imaging studies performed in emergent department  showed a subacute on chronic right subdural hematoma.  He was discharged home from the emergency department. Patient was admitted overnight, as of phalanx have reporting minimal by mouth intake as well as some altered mental status over the last several days. A repeat CT scan showed subacute on chronic right subdural hematoma, measuring 8 mm in greatest thickness. There was no specific increased density noted to suggest active hemorrhage. Patient this morning reporting feeling "sick" especially going from laying to sitting. He was orthostatic, and bolused with a liter of normal saline. Pt also positive for strep throat.    Subjective Data      Cognition  Cognition Arousal/Alertness: Awake/alert Behavior During Therapy: [facility] for  tasks assessed/performed Overall Cognitive Status: No family/caregiver present to determine baseline cognitive functioning Problem Solving: Slow  processing;Requires verbal cues;Requires tactile cues    Balance  Static Standing Balance Static Standing - Balance Support: Bilateral upper extremity supported Static Standing - Level of Assistance: 4: Min assist  End of Session PT - End of Session Equipment Utilized During Treatment: Gait belt Activity Tolerance: Treatment limited secondary to medical complications (Comment) (orthostatic) Patient left: in chair;with call bell/phone within reach;with chair alarm set Nurse Communication: Mobility status   GP     Ilean Spradlin 03/16/2013, 10:35 AM  [facility] PT [phone]

## 2013-03-17 LAB — BASIC METABOLIC PANEL
Chloride: 106 mEq/L (ref 96–112)
GFR calc Af Amer: 28 mL/min — ABNORMAL LOW (ref >90)
GFR calc non Af Amer: 24 mL/min — ABNORMAL LOW (ref >90)
Potassium: 3.6 mEq/L (ref 3.5–5.1)
Sodium: 141 mEq/L (ref 135–145)

## 2013-03-17 LAB — GLUCOSE, CAPILLARY
Glucose-Capillary: 257 mg/dL — ABNORMAL HIGH (ref 70–99)
Glucose-Capillary: 186 mg/dL — ABNORMAL HIGH (ref 70–99)

## 2013-03-17 MED ORDER — AMOXICILLIN 250 MG PO CAPS
250.0000 mg | ORAL_CAPSULE | Freq: Once | ORAL | Status: AC
  Administered 2013-03-17: 22:00:00 250 mg via ORAL
  Filled 2013-03-17: qty 1

## 2013-03-17 NOTE — Progress Notes (Signed)
NEURO HOSPITALIST PROGRESS NOTE   SUBJECTIVE:                                                                                                                        No complaints.  patient currently having NO abnormal choreiform movements.  Per PT, he movements have been steadily resolving.   OBJECTIVE:                                                                                                                           Vital signs in last 24 hours: Temp:  [97.5 F (36.4 C)-98.3 F (36.8 C)] 97.5 F (36.4 C) (09/17 0532) Pulse Rate:  [55-60] 55 (09/17 0532) Resp:  [18-20] 18 (09/17 0532) BP: (129-175)/(65-91) 159/86 mmHg (09/17 0532) SpO2:  [99 %-100 %] 100 % (09/17 0532)  Intake/Output from previous day: 09/16 0701 - 09/17 0700 In: 800 [P.O.:800] Out: 2701 [Urine:2700; Stool:1] Intake/Output this shift:   Nutritional status: Carb Control  Past Medical History  Diagnosis Date  . Coronary artery disease     post CABG in 1997 with abnormal nuclear stress test in 2007 which did demonstrate mild reversible ischemia and has been managed medically.   . Diabetes mellitus     type 2  . Hypertension   . Hyperlipidemia   . Hypercholesterolemia   . Atypical chest pain   . Hypernatremia   . History of hypoglycemia   . Syncope   . CVA (cerebral infarction)   . Allergy   . History of chicken pox   . Acute on chronic renal insufficiency   . CKD (chronic kidney disease)   . GERD (gastroesophageal reflux disease)   . H/O hiatal hernia     Neurologic Exam:  Mental Status: Alert, oriented, thought content appropriate.  Speech fluent without evidence of aphasia.  Able to follow 3 step commands without difficulty.  Cranial Nerves: II: Visual fields grossly normal, pupils equal, round, reactive to light and accommodation III,IV, VI: ptosis not present, extra-ocular motions intact bilaterally V,VII: smile symmetric, facial light touch sensation  normal bilaterally VIII: hearing normal bilaterally IX,X: gag reflex present XI: bilateral shoulder shrug XII: midline tongue extension Motor: Right : Upper extremity   5/5    Left:  Upper extremity   5/5  Lower extremity   5/5     Lower extremity   5/5 No choreiform movements noted Tone and bulk:normal tone throughout; no atrophy noted Sensory: Pinprick and light touch intact throughout, bilaterally Deep Tendon Reflexes:  Right: Upper Extremity   Left: Upper extremity   biceps (C-5 to C-6) 2/4   biceps (C-5 to C-6) 2/4 tricep (C7) 2/4    triceps (C7) 2/4 Brachioradialis (C6) 2/4  Brachioradialis (C6) 2/4  Lower Extremity Lower Extremity  quadriceps (L-2 to L-4) 2/4   quadriceps (L-2 to L-4) 2/4 Achilles (S1) 2/4   Achilles (S1) 2/4  Plantars: Right: downgoing   Left: downgoing Cerebellar: slight dysmetria finger-to-nose,  normal heel-to-shin test     Lab Results: Lab Results  Component Value Date/Time   CHOL 117 07/03/2012  3:47 PM   Lipid Panel No results found for this basename: CHOL, TRIG, HDL, CHOLHDL, VLDL, LDLCALC,  in the last 72 hours  Studies/Results: [name]  03/15/2013   *RADIOLOGY REPORT*  Clinical Data: Altered mental status.  Chorea  MRI HEAD WITHOUT CONTRAST  Technique:  Multiplanar, multiecho pulse sequences of the brain and surrounding structures were obtained according to standard protocol without intravenous contrast.  Comparison: CT 03/11/2013, MRI 12/06/2011  Findings: Negative for acute infarct.  Chronic right parietal infarct.  Chronic microvascular ischemic change in the white matter.  Negative for brainstem or cerebellar infarct.  There is a moderate atrophy.  Negative for hydrocephalus. Chronic micro hemorrhage right posterior insula is unchanged.  15 mm subdural CSF hygroma unchanged from the prior study.  8 mm right frontal hygroma also unchanged.  Complex fluid collection right parietal lobe measures 8 mm.  These are unchanged from  the recent CT but increased from the MRI of 12/06/2011.  No midline shift.  IMPRESSION: Negative for acute infarct.  15 mm CSF hygroma on the left is unchanged.  Complex subdural fluid collection right parietal lobe unchanged from the recent CT but increased in size from 12/06/2011.  Right frontal subdural hygroma unchanged from the prior MRI.   Original Report Authenticated By: Janeece Riggers, M.D.    MEDICATIONS                                                                                                                        Scheduled: . amoxicillin  250 mg Oral Q12H  . aspirin  81 mg Oral Daily  . insulin aspart  0-5 Units Subcutaneous QHS  . insulin aspart  0-9 Units Subcutaneous TID WC  . isosorbide mononitrate  60 mg Oral Daily  . metoprolol  50 mg Oral BID  . pantoprazole  40 mg Oral Daily  . 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 These movements are steadily resolving. No movements noted on Exam today. MRI brain showed no acute infarct.   Recommend: 1) No further in patient neurological diagnostic testing. If movements persist or worsen would recommend out patient neurological workup.  2) Continue to treat underlying DM and infection.   Neurology will S/O  Discussed with Dr. Cena Benton  Assessment and plan discussed with with attending physician and they are in agreement.    Felicie Morn PA-C [facility] [phone]  03/17/2013, 12:12 PM

## 2013-03-17 NOTE — Progress Notes (Signed)
03/17/2013 flu vaccine given on 03/16/2013. [facility] RN.

## 2013-03-17 NOTE — Progress Notes (Signed)
Inpatient Diabetes Program Recommendations  AACE/ADA: New Consensus Statement on Inpatient Glycemic Control (2013)  Target Ranges:  Prepandial:   less than 140 mg/dL      Peak postprandial:   less than 180 mg/dL (1-2 hours)      Critically ill patients:  140 - 180 mg/dL   Consider adding low dose basal and/or increase Novolog to moderate scale. Thank you  Piedad Climes BSN, RN,CDE Inpatient Diabetes Coordinator [phone] (team pager)

## 2013-03-17 NOTE — Progress Notes (Signed)
[facility] PROGRESS NOTE  [name] ZOX:[id number] DOB: June 16, 1928 DOA: 03/11/2013 PCP: Nicki Reaper, NP From prior note In brief, patient is  a 79 year old gentleman with a past medical history of coronary artery disease, dyslipidemia, hypertension, chronic kidney disease, who had a fall bumping his head on his fireplace. Imaging studies performed in emergent department  showed a subacute on chronic right subdural hematoma.  He was discharged home from the emergency department. Patient was admitted , as of phalanx have reporting minimal by mouth intake as well as some altered mental status over the last several days. A repeat CT scan showed subacute on chronic right subdural hematoma, measuring 8 mm in greatest thickness. There was no specific increased density noted to suggest active hemorrhage.   This AM, patient is lying in bed and reports that he feels okay.  Pt  Has not been out of bed this am and does not report dizziness.    Assessment/Plan:  Mental status changes and now with choreiform movements upon standing Family members had reported increased confusion at home. He had a fall  after which he presented to the emergent apartment, CT scan showed subacute on chronic right subdural hematoma. A repeat noncontrasted scan of brain overnight did not reveal significant changes. Radiology reporting that there was no significant increased density noted to suggest an active hemorrhage.   - MRI report indicate no change in subdural fluid collection or change in subdural hygroma from prior MRI - Per neurology no need for further  Inpatient  Neurological diagnostic testing and Pt can follow up out patient if choreiform movements continue or worsen   Orthostatic hypotension - have nursing to continue encouraging po fluid intake - Orthostatic blood pressure to be taken  -PT to continue to work with pt - Amlodipine continued to be held - currently wearing ted hose and recommend compression  stockings once patient transitions home.  Strep pharyngitis Patient reports decreased soreness in throat. Prior testing done with strep antigen  positive. Continue amoxicillin therapy. Today being day 7 of abx therapy as such will discontinue amoxicillin after today.  Acute on chronic kidney disease. - suspect decrease in oral intake - Saline lock at this point. Patient eating and drinking - creatinine this am 9/17 treading down to 2.33 from 2.76 on 9/15  Diabetes mellitus - continue SSI - Carb modified diet  Patient has a history of Stage IV CKD, as his GFR at baseline is 26-30.   Code Status: Full Family Communication: Plan discussed with patient, son and wife at bedside  Disposition Plan: SNF once family has chosen facility.    Antibiotics:  Amoxicillin >>>917   Objective: Filed Vitals:   03/17/13 0532  BP: 159/86  Pulse: 55  Temp: 97.5 F (36.4 C)  Resp: 18    Intake/Output Summary (Last 24 hours) at 03/17/13 1249 Last data filed at 03/17/13 0535  Gross per 24 hour  Intake    440 ml  Output   2001 ml  Net  -1561 ml   Filed Weights   03/12/13 2122 03/14/13 2210 03/16/13 0437  Weight: 80.2 kg (176 lb 12.9 oz) 79.652 kg (175 lb 9.6 oz) 79.652 kg (175 lb 9.6 oz)    Exam:   General:  Patient lying in bed in no acute distress. Pt alert and oriented to person and time but not to place or situation.   Cardiovascular: Regular rate, rhythm normal S1-S2,  No MRG.  Radial and pedal pulses 2+ with no cyanosis or edema  present.   Respiratory: Regular , unlabored, Clear to auscultation bilaterally with no wheezing, rhonchi   Abdomen: Soft, non tender, non distended. Bowel sounds x4  Musculoskeletal:Pt moves all extremities equally with less spontaneous choreiform movements.   Data Reviewed: Basic Metabolic Panel:  Recent Labs Lab 03/11/13 1637 03/12/13 0448 03/13/13 0650 03/15/13 0720 03/17/13 0453  NA 140 142 144 142 141  K 4.2 4.1 3.8 3.9 3.6  CL 104  107 108 108 106  CO2 19 22 20 22 23   GLUCOSE 287* 246* 222* 176* 218*  BUN 31* 29* 30* 50* 40*  CREATININE 2.51* 2.30* 2.21* 2.76* 2.33*  CALCIUM 9.7 9.6 9.7 8.9 9.1   Liver Function Tests:  Recent Labs Lab 03/11/13 1637 03/12/13 0448  AST 14 14  ALT 12 10  ALKPHOS 98 92  BILITOT 0.5 0.7  PROT 8.0 7.7  ALBUMIN 4.1 3.8   No results found for this basename: LIPASE, AMYLASE,  in the last 168 hours No results found for this basename: AMMONIA,  in the last 168 hours CBC:  Recent Labs Lab 03/11/13 1637 03/12/13 0448 03/13/13 0650  WBC 6.6 8.1 8.9  NEUTROABS 5.7  --   --   HGB 12.6* 12.4* 12.8*  HCT 35.8* 35.7* 37.6*  MCV 90.6 89.3 90.6  PLT 155 177 200   Cardiac Enzymes:  Recent Labs Lab 03/11/13 1637  TROPONINI <0.30   BNP (last 3 results) No results found for this basename: PROBNP,  in the last 8760 hours CBG:  Recent Labs Lab 03/16/13 1141 03/16/13 1648 03/16/13 2119 03/17/13 0726 03/17/13 1130  GLUCAP 277* 96 240* 210* 257*    Recent Results (from the past 240 hour(s))  RAPID STREP SCREEN     Status: Abnormal   Collection Time    03/11/13  4:16 PM      Result Value Range Status   Streptococcus, Group A Screen (Direct) POSITIVE (*) NEGATIVE Final     Studies: [name]  03/15/2013   *RADIOLOGY REPORT*  Clinical Data: Altered mental status.  Chorea  MRI HEAD WITHOUT CONTRAST  Technique:  Multiplanar, multiecho pulse sequences of the brain and surrounding structures were obtained according to standard protocol without intravenous contrast.  Comparison: CT 03/11/2013, MRI 12/06/2011  Findings: Negative for acute infarct.  Chronic right parietal infarct.  Chronic microvascular ischemic change in the white matter.  Negative for brainstem or cerebellar infarct.  There is a moderate atrophy.  Negative for hydrocephalus. Chronic micro hemorrhage right posterior insula is unchanged.  15 mm subdural CSF hygroma unchanged from the prior study.  8 mm  right frontal hygroma also unchanged.  Complex fluid collection right parietal lobe measures 8 mm.  These are unchanged from the recent CT but increased from the MRI of 12/06/2011.  No midline shift.  IMPRESSION: Negative for acute infarct.  15 mm CSF hygroma on the left is unchanged.  Complex subdural fluid collection right parietal lobe unchanged from the recent CT but increased in size from 12/06/2011.  Right frontal subdural hygroma unchanged from the prior MRI.   Original Report Authenticated By: Janeece Riggers, M.D.    Scheduled Meds: . amoxicillin  250 mg Oral Q12H  . aspirin  81 mg Oral Daily  . insulin aspart  0-5 Units Subcutaneous QHS  . insulin aspart  0-9 Units Subcutaneous TID WC  . isosorbide mononitrate  60 mg Oral Daily  . metoprolol  50 mg Oral BID  . pantoprazole  40 mg Oral Daily  .  sodium chloride  3 mL Intravenous Q12H   Continuous Infusions: . sodium chloride 50 mL/hr at 03/16/13 1844    Principal Problem:   Strep sore throat Active Problems:   Ischemic heart disease   Diabetes mellitus type 2, insulin dependent   Dehydration   CKD (chronic kidney disease)   Syncope   HTN (hypertension)   SDH (subdural hematoma)   Chorea    Time spent: 35 min    [name]  [facility] Pager [phone]. If 7PM-7AM, please contact night-coverage at [email or web address], password [email or web address] 03/17/2013, 12:49 PM  LOS: 6 days

## 2013-03-17 NOTE — Clinical Social Work Psychosocial (Signed)
Clinical Social Work Department BRIEF PSYCHOSOCIAL ASSESSMENT 03/17/2013  Patient:  FERNANDEZ, KENLEY     Account Number:  [id number]     Admit date:  03/11/2013  Clinical Social Worker:  Delmer Islam  Date/Time:  03/17/2013 06:02 AM  Referred by:  Physician  Date Referred:  03/16/2013 Referred for  SNF Placement   Other Referral:   Interview type:  Patient Other interview type:   CSW also talked with wife - Wessley Emert and son Jaydis, Duchene at the bedside.    PSYCHOSOCIAL DATA Living Status:  WIFE Admitted from facility:   Level of care:   Primary support name:  Sajjad Honea Primary support relationship to patient:  SPOUSE Degree of support available:   Spouse and son are concerned and requested to speak with CSW regarding d/c plans. Mrs. Grassia phone number (h) [phone].    CURRENT CONCERNS Current Concerns  Post-Acute Placement   Other Concerns:    SOCIAL WORK ASSESSMENT / PLAN CSW spoke with patient, wife and son regarding d/c plans and MD/PT's recommendation of short-term rehab and they are in agreement. CSW explained process and answered questions. Mrs. Cerullo was given SNF list for [facility] and asked if she had any facility preferences, to which she replied [facility] as this facility near their home. Patient's son encouraged his mother to talk with his other siblings before making a final decision. CSW advised that they can talk as a family later today. CSW informed Mrs. Kobler that she will be contacted regarding bed offers.   Assessment/plan status:   Other assessment/ plan:   Information/referral to community resources:   SNF list for [facility]    PATIENT'S/FAMILY'S RESPONSE TO PLAN OF CARE: Family very proactive in requesting to talk with CSW regarding d/c plans. They expressed appreciation to CSW for assistance with SNF placement.

## 2013-03-18 DIAGNOSIS — R079 Chest pain, unspecified: Secondary | ICD-10-CM

## 2013-03-18 LAB — GLUCOSE, CAPILLARY: Glucose-Capillary: 251 mg/dL — ABNORMAL HIGH (ref 70–99)

## 2013-03-18 NOTE — Progress Notes (Signed)
Physical Therapy Treatment Patient Details Name: [name] MRN: [id number] DOB: 1927/12/17 Today's Date: 03/18/2013 Time: 4098-1191 PT Time Calculation (min): 12 min  PT Assessment / Plan / Recommendation  History of Present Illness In brief patient is is an 79 year old gentleman with a past medical history of coronary artery disease, dyslipidemia, hypertension, chronic kidney disease, who had a fall 2 days ago bumping his head on his fireplace. Imaging studies performed in emergent department  showed a subacute on chronic right subdural hematoma.  He was discharged home from the emergency department. Patient was admitted overnight, as of phalanx have reporting minimal by mouth intake as well as some altered mental status over the last several days. A repeat CT scan showed subacute on chronic right subdural hematoma, measuring 8 mm in greatest thickness. There was no specific increased density noted to suggest active hemorrhage. Patient this morning reporting feeling "sick" especially going from laying to sitting. He was orthostatic, and bolused with a liter of normal saline. Pt also positive for strep throat.   PT Comments   Pt with improving mobility.  No random movements noted.    Follow Up Recommendations  SNF     Does the patient have the potential to tolerate intense rehabilitation     Barriers to Discharge        Equipment Recommendations  Rolling walker with 5" wheels    Recommendations for Other Services    Frequency Min 3X/week   Progress towards PT Goals Progress towards PT goals: Progressing toward goals  Plan Current plan remains appropriate    Precautions / Restrictions Precautions Precautions: Fall   Pertinent Vitals/Pain No c/o's    Mobility  Transfers Sit to Stand: 4: Min assist;With upper extremity assist;From chair/3-in-1;With armrests Stand to Sit: 4: Min assist;With upper extremity assist;To chair/3-in-1;With armrests Details for Transfer Assistance: verbal  cues for hand placement. Ambulation/Gait Ambulation/Gait Assistance: 4: Min assist Ambulation Distance (Feet): 100 Feet (and 50' x 1) Assistive device: Rolling walker Ambulation/Gait Assistance Details: Verbal cues to look up and for steering. Gait Pattern: Step-through pattern;Decreased stride length;Narrow base of support    Exercises     PT Diagnosis:    PT Problem List:   PT Treatment Interventions:     PT Goals (current goals can now be found in the care plan section)    Visit Information  Last PT Received On: 03/18/13 Assistance Needed: +1 History of Present Illness: In brief patient is is an 79 year old gentleman with a past medical history of coronary artery disease, dyslipidemia, hypertension, chronic kidney disease, who had a fall 2 days ago bumping his head on his fireplace. Imaging studies performed in emergent department  showed a subacute on chronic right subdural hematoma.  He was discharged home from the emergency department. Patient was admitted overnight, as of phalanx have reporting minimal by mouth intake as well as some altered mental status over the last several days. A repeat CT scan showed subacute on chronic right subdural hematoma, measuring 8 mm in greatest thickness. There was no specific increased density noted to suggest active hemorrhage. Patient this morning reporting feeling "sick" especially going from laying to sitting. He was orthostatic, and bolused with a liter of normal saline. Pt also positive for strep throat.    Subjective Data      Cognition  Cognition Arousal/Alertness: Awake/alert Behavior During Therapy: WFL for tasks assessed/performed Overall Cognitive Status: No family/caregiver present to determine baseline cognitive functioning    Balance  Static Standing Balance Static Standing -  Balance Support: Bilateral upper extremity supported Static Standing - Level of Assistance: 4: Min assist  End of Session PT - End of Session Equipment  Utilized During Treatment: Gait belt Activity Tolerance: Patient tolerated treatment well Patient left: in chair;with call bell/phone within reach;with chair alarm set Nurse Communication: Mobility status   GP     [name] 03/18/2013, 9:10 AM  [employer] PT [phone]

## 2013-03-18 NOTE — Progress Notes (Signed)
[facility] PROGRESS NOTE  [name] JXB:[id number] DOB: [birth year] DOA: 03/11/2013 PCP: Nicki Reaper, NP From prior note In brief, patient is  a 79 year old gentleman with a past medical history of coronary artery disease, dyslipidemia, hypertension, chronic kidney disease, who had a fall bumping his head on his fireplace. Imaging studies performed in emergent department  showed a subacute on chronic right subdural hematoma.  He was discharged home from the emergency department. Patient was admitted , as of phalanx have reporting minimal by mouth intake as well as some altered mental status over the last several days. A repeat CT scan showed subacute on chronic right subdural hematoma, measuring 8 mm in greatest thickness. There was no specific increased density noted to suggest active hemorrhage.   This AM, patient is lying in bed and reports that he feels okay.  Pt  Has not been out of bed this am and does not report dizziness.    Assessment/Plan:  Mental status changes Family members had reported increased confusion at home. He had a fall  after which he presented to the emergent apartment, CT scan showed subacute on chronic right subdural hematoma. A repeat noncontrasted scan of brain overnight did not reveal significant changes. Radiology reporting that there was no significant increased density noted to suggest an active hemorrhage.   - MRI report indicate no change in subdural fluid collection or change in subdural hygroma from prior MRI - Per neurology no need for further  Inpatient  Neurological diagnostic testing and Pt can follow up out patient if choreiform movements continue or worsen or recur   Orthostatic hypotension - have nursing to continue encouraging po fluid intake - Orthostatic blood pressures still indicate that patient is having orthostatic hypotension. -PT to continue to work with pt and per their reports patient has been feeling "sick" when he goes from laying  to sitting.  As such consulted Cardiology for further recommendations given persistent symptoms. - Amlodipine continued to be held - currently wearing ted hose and recommend compression stockings once patient transitions home.  Strep pharyngitis Patient reports decreased soreness in throat. Prior testing done with strep antigen  positive. Continue amoxicillin therapy. Today being day 7 of abx therapy as such will discontinue amoxicillin after today.  Acute on chronic kidney disease. - suspect decrease in oral intake - Saline lock at this point. Patient eating and drinking - creatinine this am 9/17 treading down to 2.33 from 2.76 on 9/15  Diabetes mellitus - continue SSI - Carb modified diet  Patient has a history of Stage IV CKD, as his GFR at baseline is 26-30.   Code Status: Full Family Communication: No family today at bedside.  Disposition Plan: SNF once family has chosen facility.    Antibiotics:  Amoxicillin >>>9/17   Objective: Filed Vitals:   03/18/13 1217  BP: 111/64  Pulse: 100  Temp: 97.4 F (36.3 C)  Resp: 17    Intake/Output Summary (Last 24 hours) at 03/18/13 1645 Last data filed at 03/18/13 0900  Gross per 24 hour  Intake    720 ml  Output    525 ml  Net    195 ml   Filed Weights   03/14/13 2210 03/16/13 0437 03/17/13 1949  Weight: 79.652 kg (175 lb 9.6 oz) 79.652 kg (175 lb 9.6 oz) 77.293 kg (170 lb 6.4 oz)    Exam:   General:  Patient lying in bed in no acute distress. Pt alert and oriented to person and time but not to  place or situation.   Cardiovascular: Regular rate, rhythm normal S1-S2,  No MRG.  Radial and pedal pulses 2+ with no cyanosis or edema present.   Respiratory: Regular , unlabored, Clear to auscultation bilaterally with no wheezing, rhonchi   Abdomen: Soft, non tender, non distended. Bowel sounds x4  Musculoskeletal:Pt moves all extremities equally with less spontaneous choreiform movements.   Data Reviewed: Basic  Metabolic Panel:  Recent Labs Lab 03/12/13 0448 03/13/13 0650 03/15/13 0720 03/17/13 0453  NA 142 144 142 141  K 4.1 3.8 3.9 3.6  CL 107 108 108 106  CO2 22 20 22 23   GLUCOSE 246* 222* 176* 218*  BUN 29* 30* 50* 40*  CREATININE 2.30* 2.21* 2.76* 2.33*  CALCIUM 9.6 9.7 8.9 9.1   Liver Function Tests:  Recent Labs Lab 03/12/13 0448  AST 14  ALT 10  ALKPHOS 92  BILITOT 0.7  PROT 7.7  ALBUMIN 3.8   No results found for this basename: LIPASE, AMYLASE,  in the last 168 hours No results found for this basename: AMMONIA,  in the last 168 hours CBC:  Recent Labs Lab 03/12/13 0448 03/13/13 0650  WBC 8.1 8.9  HGB 12.4* 12.8*  HCT 35.7* 37.6*  MCV 89.3 90.6  PLT 177 200   Cardiac Enzymes: No results found for this basename: CKTOTAL, CKMB, CKMBINDEX, TROPONINI,  in the last 168 hours BNP (last 3 results) No results found for this basename: PROBNP,  in the last 8760 hours CBG:  Recent Labs Lab 03/17/13 1130 03/17/13 1641 03/17/13 2118 03/18/13 0751 03/18/13 1157  GLUCAP 257* 186* 166* 248* 274*    Recent Results (from the past 240 hour(s))  RAPID STREP SCREEN     Status: Abnormal   Collection Time    03/11/13  4:16 PM      Result Value Range Status   Streptococcus, Group A Screen (Direct) POSITIVE (*) NEGATIVE Final     Studies: No results found.  Scheduled Meds: . aspirin  81 mg Oral Daily  . insulin aspart  0-5 Units Subcutaneous QHS  . insulin aspart  0-9 Units Subcutaneous TID WC  . isosorbide mononitrate  60 mg Oral Daily  . metoprolol  50 mg Oral BID  . pantoprazole  40 mg Oral Daily  . sodium chloride  3 mL Intravenous Q12H   Continuous Infusions:    Principal Problem:   Strep sore throat Active Problems:   Ischemic heart disease   Diabetes mellitus type 2, insulin dependent   Dehydration   CKD (chronic kidney disease)   Syncope   HTN (hypertension)   SDH (subdural hematoma)   Chorea    Time spent: 35 min    [name]  [facility] Pager [phone]. If 7PM-7AM, please contact night-coverage at [email or web address], password [facility] 03/18/2013, 4:45 PM  LOS: 7 days

## 2013-03-18 NOTE — Discharge Summary (Signed)
Physician Discharge Summary  [name] AOZ:[id number] DOB: [birth year] DOA: 03/11/2013  PCP: Nicki Reaper, NP  Admit date: 03/11/2013 Discharge date: 03/18/2013  Time spent: > 35 minutes  Recommendations for Outpatient Follow-up:  1. Please be sure to follow up with your primary care physician for further work up and recommendations 2. Patient is to wear compression stockings ideally up to his thighs.  He needs continued physical therapy while at SNF. Patient will need rolling walker with 5 inch wheels 3. Monitor blood sugars and adjust hypoglycemic agents pending values.  Discharge Diagnoses:  Principal Problem:   Strep sore throat Active Problems:   Ischemic heart disease   Diabetes mellitus type 2, insulin dependent   Dehydration   CKD (chronic kidney disease)   Syncope   HTN (hypertension)   SDH (subdural hematoma)   Chorea   Discharge Condition: Stable  Diet recommendation: low sodium heart healthy, carb modified  Filed Weights   03/14/13 2210 03/16/13 0437 03/17/13 1949  Weight: 79.652 kg (175 lb 9.6 oz) 79.652 kg (175 lb 9.6 oz) 77.293 kg (170 lb 6.4 oz)    History of present illness:  From original HPI: Isay Perleberg is a 79 y.o. male with Past medical history of coronary disease, diabetes, hypertension, dyslipidemia, CVA, CKD.  The patient presented with the complaint of altered mental status. As per the family the patient was not taking anything orally since last to 3 days. Patient complains of sore throat and feeling weak and lethargic. He denies any focal neurological complaint. 2 days ago patient was found to have a fall, was presented to the ER and was found to have subdural hematoma. Due to the soreness of the throat the patient was not able to take his medications as well.  Initially the patient was complaining of nausea but currently he says nausea has subsided.  Hospital Course:  In brief, patient is a 79 year old gentleman with a past medical history of  coronary artery disease, dyslipidemia, hypertension, chronic kidney disease, who had a fall bumping his head on his fireplace. Imaging studies performed in emergent department showed a subacute on chronic right subdural hematoma. He was discharged home from the emergency department. Patient was admitted , as of phalanx have reporting minimal by mouth intake as well as some altered mental status over the last several days. A repeat CT scan showed subacute on chronic right subdural hematoma, measuring 8 mm in greatest thickness. There was no specific increased density noted to suggest active hemorrhage.  This AM, patient is lying in bed and reports that he feels okay. Pt Has not been out of bed this am and does not report dizziness.   Assessment and plan  Mental status changes and now with choreiform movements upon standing  Family members had reported increased confusion at home. He had a fall after which he presented to the emergent apartment, CT scan showed subacute on chronic right subdural hematoma. A repeat noncontrasted scan of brain overnight did not reveal significant changes. Radiology reporting that there was no significant increased density noted to suggest an active hemorrhage.  - MRI report indicate no change in subdural fluid collection or change in subdural hygroma from prior MRI  - Per neurology no need for further Inpatient Neurological diagnostic testing and Pt can follow up out patient if hyperkinetic movement d/o continue or worsen   Orthostatic hypotension  - B blocker and Imdur decreased dose. Amlodipine and ARB discontinued -Recommend compression stockings once patient transitions to SNF.   Strep  pharyngitis  Patient reports decreased soreness in throat. Prior testing done with strep antigen positive. Continue amoxicillin therapy. Today being day 7 of abx therapy as such will discontinue amoxicillin after today.   Acute on chronic kidney disease.  - suspect decrease in oral  intake  - Saline lock at this point. Patient eating and drinking  - creatinine this am 9/17 treading down to 2.33 from 2.76 on 9/15   Diabetes mellitus  - continue home regimen - Carb modified diet   Patient has a history of Stage IV CKD, as his GFR at baseline is 26-30.    Procedures:  Please see imaging study results listed below.  Consultations:  Neurology  Discharge Exam: Filed Vitals:   03/18/13 1217  BP: 111/64  Pulse: 100  Temp: 97.4 F (36.3 C)  Resp: 17    General: Pt in NAD, Alert and Awake Cardiovascular: RRR, no MRG Respiratory: CTA BL, no wheezes  Discharge Instructions  Discharge Orders   Future Appointments Provider Department Dept Phone   04/06/2013 10:00 AM Nicki Reaper, NP [facility] [phone]   Future Orders Complete By Expires   Call MD for:  difficulty breathing, headache or visual disturbances  As directed    Call MD for:  extreme fatigue  As directed    Call MD for:  severe uncontrolled pain  As directed    Call MD for:  temperature >100.4  As directed    Diet - low sodium heart healthy  As directed    Discharge instructions  As directed    Comments:     Please be sure to wear compression stockings once discharged from hospital.  Also you will need continued physical therapy while at SNF.   Increase activity slowly  As directed        Medication List    STOP taking these medications       amLODipine 5 MG tablet  Commonly known as:  NORVASC     losartan 50 MG tablet  Commonly known as:  COZAAR      TAKE these medications       aspirin 81 MG tablet  Take 81 mg by mouth daily.     glipiZIDE 10 MG tablet  Commonly known as:  GLUCOTROL  Take 1 tablet (10 mg total) by mouth 2 (two) times daily before a meal.     isosorbide mononitrate 60 MG 24 hr tablet  Commonly known as:  IMDUR  Take 60 mg by mouth daily.     metoprolol 50 MG tablet  Commonly known as:  LOPRESSOR  Take 50 mg by mouth 2 (two)  times daily.     omeprazole 20 MG capsule  Commonly known as:  PRILOSEC  Take 20 mg by mouth daily.     traMADol 50 MG tablet  Commonly known as:  ULTRAM  Take 1 tablet (50 mg total) by mouth every 8 (eight) hours as needed for pain.       No Known Allergies    The results of significant diagnostics from this hospitalization (including imaging, microbiology, ancillary and laboratory) are listed below for reference.    Significant Diagnostic Studies: Dg Chest 2 View  03/11/2013   *RADIOLOGY REPORT*  Clinical Data: Recent traumatic injury  CHEST - 2 VIEW  Comparison: 06/08/2012  Findings: Cardiac shadow is stable.  Postsurgical changes are again noted.  The lungs are well-aerated with left basilar atelectasis seen.  No other focal abnormality is noted.  IMPRESSION:  Left basilar atelectasis.   Original Report Authenticated By: Alcide Clever, M.D.   Ct Head Wo Contrast  03/11/2013   *RADIOLOGY REPORT*  Clinical Data: Altered mental status  CT HEAD WITHOUT CONTRAST  Technique:  Contiguous axial images were obtained from the base of the skull through the vertex without contrast.  Comparison: 03/09/2013  Findings: The bony calvarium is intact.  No gross soft tissue abnormality is noted.  Diffuse atrophic changes are noted. Subdural hygroma is again noted on the left and stable.  A chronic right-sided subdural hematoma is again identified but slightly better delineated on the current examination.  No significant increased attenuation is identified to suggest that this is an acute event.  Changes consistent with prior right occipital infarct and right parietal infarct are noted.  IMPRESSION: Subacute to chronic right subdural hematoma better delineated on the current exam.  It measures approximately 8 mm in greatest thickness.  No significant increased density is noted to suggest an active hemorrhage.  Chronic changes as described.   Original Report Authenticated By: Alcide Clever, M.D.   Ct Head Wo  Contrast  03/09/2013   *RADIOLOGY REPORT*  Clinical Data: Syncope.  Head injury  CT HEAD WITHOUT CONTRAST  Technique:  Contiguous axial images were obtained from the base of the skull through the vertex without contrast.  Comparison: 12/06/2011  Findings: There is diffuse patchy low density throughout the subcortical and periventricular white matter consistent with chronic small vessel ischemic change.  There is prominence of the sulci and ventricles consistent with brain atrophy. Encephalomalacia involving the left posterior parietal and occipital lobes appears similar to previous exam.  Subacute on chronic right subdural hematoma is identified which measures up to 6 mm in thickness, image 23/series 2.  There is no significant mass effect upon the adjacent brain parenchyma.  Large, left sided subdural hygroma is unchanged from previous exam measuring up to 1.7 cm.  There is mild mucosal thickening involving the sphenoid sinus.  The mastoid air cells appear clear.  The skull is intact.  IMPRESSION:  1.  No acute intracranial abnormalities. 2.  Advance small vessel ischemic disease and brain atrophy. 3.  Subacute on chronic right subdural hematoma.   Original Report Authenticated By: Signa Kell, M.D.   [name]  03/15/2013   *RADIOLOGY REPORT*  Clinical Data: Altered mental status.  Chorea  MRI HEAD WITHOUT CONTRAST  Technique:  Multiplanar, multiecho pulse sequences of the brain and surrounding structures were obtained according to standard protocol without intravenous contrast.  Comparison: CT 03/11/2013, MRI 12/06/2011  Findings: Negative for acute infarct.  Chronic right parietal infarct.  Chronic microvascular ischemic change in the white matter.  Negative for brainstem or cerebellar infarct.  There is a moderate atrophy.  Negative for hydrocephalus. Chronic micro hemorrhage right posterior insula is unchanged.  15 mm subdural CSF hygroma unchanged from the prior study.  8 mm right frontal  hygroma also unchanged.  Complex fluid collection right parietal lobe measures 8 mm.  These are unchanged from the recent CT but increased from the MRI of 12/06/2011.  No midline shift.  IMPRESSION: Negative for acute infarct.  15 mm CSF hygroma on the left is unchanged.  Complex subdural fluid collection right parietal lobe unchanged from the recent CT but increased in size from 12/06/2011.  Right frontal subdural hygroma unchanged from the prior MRI.   Original Report Authenticated By: Janeece Riggers, M.D.    Microbiology: Recent Results (from the past 240 hour(s))  RAPID STREP  SCREEN     Status: Abnormal   Collection Time    03/11/13  4:16 PM      Result Value Range Status   Streptococcus, Group A Screen (Direct) POSITIVE (*) NEGATIVE Final     Labs: Basic Metabolic Panel:  Recent Labs Lab 03/11/13 1637 03/12/13 0448 03/13/13 0650 03/15/13 0720 03/17/13 0453  NA 140 142 144 142 141  K 4.2 4.1 3.8 3.9 3.6  CL 104 107 108 108 106  CO2 19 22 20 22 23   GLUCOSE 287* 246* 222* 176* 218*  BUN 31* 29* 30* 50* 40*  CREATININE 2.51* 2.30* 2.21* 2.76* 2.33*  CALCIUM 9.7 9.6 9.7 8.9 9.1   Liver Function Tests:  Recent Labs Lab 03/11/13 1637 03/12/13 0448  AST 14 14  ALT 12 10  ALKPHOS 98 92  BILITOT 0.5 0.7  PROT 8.0 7.7  ALBUMIN 4.1 3.8   No results found for this basename: LIPASE, AMYLASE,  in the last 168 hours No results found for this basename: AMMONIA,  in the last 168 hours CBC:  Recent Labs Lab 03/11/13 1637 03/12/13 0448 03/13/13 0650  WBC 6.6 8.1 8.9  NEUTROABS 5.7  --   --   HGB 12.6* 12.4* 12.8*  HCT 35.8* 35.7* 37.6*  MCV 90.6 89.3 90.6  PLT 155 177 200   Cardiac Enzymes:  Recent Labs Lab 03/11/13 1637  TROPONINI <0.30   BNP: BNP (last 3 results) No results found for this basename: PROBNP,  in the last 8760 hours CBG:  Recent Labs Lab 03/17/13 1130 03/17/13 1641 03/17/13 2118 03/18/13 0751 03/18/13 1157  GLUCAP 257* 186* 166* 248* 274*        Signed:  [name]  [facility] 03/18/2013, 4:21 PM

## 2013-03-19 LAB — GLUCOSE, CAPILLARY
Glucose-Capillary: 233 mg/dL — ABNORMAL HIGH (ref 70–99)
Glucose-Capillary: 278 mg/dL — ABNORMAL HIGH (ref 70–99)
Glucose-Capillary: 219 mg/dL — ABNORMAL HIGH (ref 70–99)

## 2013-03-19 MED ORDER — METOPROLOL TARTRATE 12.5 MG HALF TABLET
12.5000 mg | ORAL_TABLET | Freq: Two times a day (BID) | ORAL | Status: DC
  Administered 2013-03-19 – 2013-03-22 (×6): 12.5 mg via ORAL
  Filled 2013-03-19 (×8): qty 1

## 2013-03-19 MED ORDER — ISOSORBIDE MONONITRATE ER 30 MG PO TB24
30.0000 mg | ORAL_TABLET | Freq: Every day | ORAL | Status: DC
  Administered 2013-03-20 – 2013-03-22 (×3): 30 mg via ORAL
  Filled 2013-03-19 (×3): qty 1

## 2013-03-19 NOTE — Progress Notes (Signed)
Physical Therapy Treatment Patient Details Name: [name] MRN: [id number] DOB: 07-12-1927 Today's Date: 03/19/2013 Time: 1415-1430 PT Time Calculation (min): 15 min  PT Assessment / Plan / Recommendation  History of Present Illness In brief patient is is an 79 year old gentleman with a past medical history of coronary artery disease, dyslipidemia, hypertension, chronic kidney disease, who had a fall 2 days ago bumping his head on his fireplace. Imaging studies performed in emergent department  showed a subacute on chronic right subdural hematoma.  He was discharged home from the emergency department. Patient was admitted overnight, as of phalanx have reporting minimal by mouth intake as well as some altered mental status over the last several days. A repeat CT scan showed subacute on chronic right subdural hematoma, measuring 8 mm in greatest thickness. There was no specific increased density noted to suggest active hemorrhage. Patient this morning reporting feeling "sick" especially going from laying to sitting. He was orthostatic, and bolused with a liter of normal saline. Pt also positive for strep throat.   PT Comments   Pt agreeable to participate in therapy.  Progressing with mobility however cont to recommend SNF at d/c to maximize independence with functional mobility.     Follow Up Recommendations  SNF     Does the patient have the potential to tolerate intense rehabilitation     Barriers to Discharge        Equipment Recommendations  Rolling walker with 5" wheels    Recommendations for Other Services OT consult  Frequency Min 3X/week   Progress towards PT Goals Progress towards PT goals: Progressing toward goals  Plan Current plan remains appropriate    Precautions / Restrictions Precautions Precautions: Fall       Mobility  Bed Mobility Bed Mobility: Supine to Sit;Sitting - Scoot to Edge of Bed;Sit to Supine Right Sidelying to Sit: 6: Modified independent  (Device/Increase time);HOB flat Supine to Sit: 6: Modified independent (Device/Increase time);HOB flat Sitting - Scoot to Edge of Bed: 5: Supervision Details for Bed Mobility Assistance: Supervision for safety due to pt attempting to stand prior to RW being in front of him Transfers Transfers: Sit to Stand;Stand to Sit Sit to Stand: 4: Min guard;With upper extremity assist;From bed Stand to Sit: 4: Min guard;With upper extremity assist;To bed Details for Transfer Assistance: cues for hand placement & to wait to stand until RW in place Ambulation/Gait Ambulation/Gait Assistance: 4: Min guard Ambulation Distance (Feet): 200 Feet Assistive device: Rolling walker Ambulation/Gait Assistance Details: Guarding for safety.  Pt having difficulty steering RW & maintaining straight path.   Gait Pattern: Step-through pattern;Decreased stride length Stairs: No Wheelchair Mobility Wheelchair Mobility: No      PT Goals (current goals can now be found in the care plan section) Acute Rehab PT Goals Time For Goal Achievement: 03/22/13 Potential to Achieve Goals: Good  Visit Information  Last PT Received On: 03/19/13 Assistance Needed: +1 History of Present Illness: In brief patient is is an 79 year old gentleman with a past medical history of coronary artery disease, dyslipidemia, hypertension, chronic kidney disease, who had a fall 2 days ago bumping his head on his fireplace. Imaging studies performed in emergent department  showed a subacute on chronic right subdural hematoma.  He was discharged home from the emergency department. Patient was admitted overnight, as of phalanx have reporting minimal by mouth intake as well as some altered mental status over the last several days. A repeat CT scan showed subacute on chronic right subdural hematoma, measuring 8  mm in greatest thickness. There was no specific increased density noted to suggest active hemorrhage. Patient this morning reporting feeling  "sick" especially going from laying to sitting. He was orthostatic, and bolused with a liter of normal saline. Pt also positive for strep throat.    Subjective Data      Cognition  Cognition Arousal/Alertness: Awake/alert Behavior During Therapy: WFL for tasks assessed/performed Overall Cognitive Status: No family/caregiver present to determine baseline cognitive functioning    Balance     End of Session PT - End of Session Equipment Utilized During Treatment: Gait belt Activity Tolerance: Patient tolerated treatment well Patient left: in bed;with call bell/phone within reach Nurse Communication: Mobility status   GP     Lara Mulch 03/19/2013, 2:41 PM   Verdell Face, PTA [phone] 03/19/2013

## 2013-03-19 NOTE — Progress Notes (Signed)
CBGs on 9/18   [phone] mg/dl      1/61  096 mg/dl Recommend starting Lantus basal insulin 10-15 units daily if CBGs continue greater than 180 mg/dl.  Increase Novolog correction scale to MODERATE. Smith Mince RN BSN CDE

## 2013-03-19 NOTE — Consult Note (Signed)
CARDIOLOGY CONSULT NOTE   Patient ID: [name] MRN: [id number] DOB/AGE: 08/25/1927 79 y.o.  Admit date: 03/11/2013  Primary Physician   Nicki Reaper, NP Primary Cardiologist  TB Reason for Consultation   Orthostatic hypotension  FAO:ZHYQM Odor is a 79 y.o. male with a history of CAD. He was not taking oral meds or eating/drinking for 3 days prior to admission on 9/11. He fell, which prompted his admission on 9/11.  He is not really able to tell me what happened prior to the fall, he does not have much memory of this.  He says that it happened around the time that he was eating a meal.  A CT scan showed a subacute on chronic right SDH. 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  In the hospital, he has been found to have Strep pharyngitis, confusion in setting of subdural hematoma, acute on chronic kidney injury, and orthostatic hypotension.  Amlodipine and losartan were stopped.  To me, he denies being lightheaded with standing.  He says that his legs are "shaky."  No chest pain, no dyspnea.     Past Medical History  Diagnosis Date  . Coronary artery disease     post CABG in 1997 with abnormal nuclear stress test in 2007 which did demonstrate mild reversible ischemia and has been managed medically.   . Diabetes mellitus     type 2  . Hypertension   . Hyperlipidemia   . Hypercholesterolemia   . Atypical chest pain   . Hypernatremia   . History of hypoglycemia   . Syncope   . CVA (cerebral infarction)   . Allergy   . History of chicken pox   . Acute on chronic renal insufficiency   . CKD (chronic kidney disease)   . GERD (gastroesophageal reflux disease)   . H/O hiatal hernia      Past Surgical History  Procedure Laterality Date  . Coronary artery bypass graft  1997    x3 -- patent internal mammary graft to saphenous vein grafts x3  . Tonsillectomy    . Cardiac catheterization  01/26/1999    patent internal mammary graft to saphenous vein grafts x3 --  singnificant three-vessel coroanry artery disease -- potential sites of ischemia involving the intermediate branch, circumflex branch and the distal LAD through diffuse disease, as well as steal type syndrome -- normal LV function -- W. Ashley Royalty., M.D.    . Esophagogastroduodenoscopy  07/22/2012    Procedure: ESOPHAGOGASTRODUODENOSCOPY (EGD);  Surgeon: Theda Belfast, MD;  Location: [facility];  Service: Endoscopy;  Laterality: N/A;  . Colonoscopy  07/22/2012    Procedure: COLONOSCOPY;  Surgeon: Theda Belfast, MD;  Location: [facility];  Service: Endoscopy;  Laterality: N/A;    No Known Allergies  I have reviewed the patient's current medications . aspirin  81 mg Oral Daily  . insulin aspart  0-5 Units Subcutaneous QHS  . insulin aspart  0-9 Units Subcutaneous TID WC  . isosorbide mononitrate  60 mg Oral Daily  . metoprolol  50 mg Oral BID  . pantoprazole  40 mg Oral Daily  . sodium chloride  3 mL Intravenous Q12H     acetaminophen, acetaminophen, gi cocktail, hydrALAZINE, lidocaine, menthol-cetylpyridinium, morphine injection, ondansetron (ZOFRAN) IV, ondansetron, senna  Prior to Admission medications   Medication Sig Start Date End Date Taking? Authorizing Provider  aspirin 81 MG tablet Take 81 mg by mouth daily.   Yes Historical Provider, MD  glipiZIDE (GLUCOTROL) 10  MG tablet Take 1 tablet (10 mg total) by mouth 2 (two) times daily before a meal. 10/02/12  Yes Nicki Reaper, NP  isosorbide mononitrate (IMDUR) 60 MG 24 hr tablet Take 60 mg by mouth daily.   Yes Historical Provider, MD  metoprolol (LOPRESSOR) 50 MG tablet Take 50 mg by mouth 2 (two) times daily.   Yes Historical Provider, MD  omeprazole (PRILOSEC) 20 MG capsule Take 20 mg by mouth daily.   Yes Historical Provider, MD  traMADol (ULTRAM) 50 MG tablet Take 1 tablet (50 mg total) by mouth every 8 (eight) hours as needed for pain. 07/03/12  Yes Nicki Reaper, NP     History   Social History  . Marital Status:  Married    Spouse Name: N/A    Number of Children: N/A  . Years of Education: 13   Occupational History  . Retired    Social History Main Topics  . Smoking status: Never Smoker   . Smokeless tobacco: Never Used  . Alcohol Use: No  . Drug Use: No  . Sexual Activity: Not Currently   Other Topics Concern  . Not on file   Social History Narrative   Regular exercise-no   Caffeine Use-    Family Status  Relation Status Death Age  . Father Deceased 54    age  . Mother Deceased 50    age  . Sister Deceased   . Son Deceased   . Son Alive   . Sister Alive   . Daughter Alive   . Daughter Alive   . Son Alive    Family History  Problem Relation Age of Onset  . Hypertension Other     Parent  . Diabetes Other     Parent  . Hyperlipidemia Other     Parent     ROS:  Full 14 point review of systems complete and found to be negative unless listed above.  Physical Exam: Blood pressure 128/72, pulse 55, temperature 99.4 F (37.4 C), temperature source Oral, resp. rate 18, height 6\' 1"  (1.854 m), weight 174 lb 4.8 oz (79.062 kg), SpO2 99.00%.  General: Well developed, well nourished, male in no acute distress Lungs: CTAB Heart: HRRR S1 S2, no rub/gallop, Heart regular rate and rhythm with S1, S2, no murmur. pulses are 2+ extrem.   Neck: No carotid bruits. No lymphadenopathy.  JVD. Abdomen: Bowel sounds present, abdomen soft and non-tender without masses or hernias noted. Msk:  No spine or cva tenderness. No weakness, no joint deformities or effusions. Extremities: No clubbing or cyanosis.  edema.  Neuro: Alert and oriented X 3. No focal deficits noted. Psych:  Good affect, responds appropriately Skin: No rashes or lesions noted.  Labs:   Lab Results  Component Value Date   WBC 8.9 03/13/2013   HGB 12.8* 03/13/2013   HCT 37.6* 03/13/2013   MCV 90.6 03/13/2013   PLT 200 03/13/2013   No results found for this basename: INR,  in the last 72 hours  Recent Labs Lab  03/17/13 0453  NA 141  K 3.6  CL 106  CO2 23  BUN 40*  CREATININE 2.33*  CALCIUM 9.1  GLUCOSE 218*   No results found for this basename: mg   No results found for this basename: CKTOTAL, CKMB, TROPONINI,  in the last 72 hours No results found for this basename: TROPIPOC,  in the last 72 hours Pro B Natriuretic peptide (BNP)  Date/Time Value Range Status  12/06/2011  4:10 AM  477.3* 0 - 450 pg/mL Final   Lab Results  Component Value Date   CHOL 117 07/03/2012   HDL 29.60* 07/03/2012   LDLCALC 51 07/03/2012   TRIG 183.0* 07/03/2012   No results found for this basename: DDIMER   Lipase  Date/Time Value Range Status  07/12/2011  7:30 PM 41  11 - 59 U/L Final   TSH  Date/Time Value Range Status  06/08/2012  3:38 PM 1.964  0.350 - 4.500 uIU/mL Final   Vitamin B-12  Date/Time Value Range Status  09/30/2012 10:57 AM 344  211 - 911 pg/mL Final     Folate  Date/Time Value Range Status  09/30/2012 10:57 AM 13.7  >5.9 ng/mL Final     Ferritin  Date/Time Value Range Status  09/30/2012 10:57 AM 189.9  22.0 - 322.0 ng/mL Final     Iron  Date/Time Value Range Status  09/30/2012 10:57 AM 50  42 - 165 ug/dL Final    Echo (4/09): EF 55-60%, mild MR  ECG:  NSR, no significant abnormalities  Radiology:  Head CT with subacute subdural hematoma  ASSESSMENT AND PLAN:   The patient was seen today by Dr. Shirlee Latch, the patient evaluated and the data reviewed.  Principal Problem:   Strep sore throat Active Problems:   Ischemic heart disease   Diabetes mellitus type 2, insulin dependent   Dehydration   CKD (chronic kidney disease)   Syncope   HTN (hypertension)   SDH (subdural hematoma)   Chorea   Signed: Theodore Demark, PA-C 03/19/2013 12:25 PM Beeper [phone]  Co-Sign MD  Patient has orthostatic hypotension in the setting of strep pharyngitis and presumed dehydration.  History is difficult with some confusion in setting of SDH.  He is not on telemetry but problem does not seem to be  arrhythmia-related.  Last echo in 6/13 showed normal EF.  ECG is normal.  - Would repeat echo.  - Agree with hold amlodipine and losartan - Decrease metoprolol to 12.5 mg bid.  - Decrease Imdur to 30 mg daily, may need to cut out entirely.  - Compression stockings.  - Check orthostatics in the morning.  Marca Ancona 03/19/2013 7:49 PM

## 2013-03-19 NOTE — Clinical Social Work Note (Signed)
Received call from Melissa at [facility] at 4:17 pm advising that she has rec'd authorization from [facility] for patient to come to their facility for short-term rehab. Melissa informed that patient not medically stable today and hopefully he will be ready on Monday. Per Efraim Kaufmann, if patient not ready for d/c on Monday, she will need additional clinicals to submit to [facility].  Genelle Bal, MSW, LCSW [phone]

## 2013-03-19 NOTE — Clinical Social Work Placement (Addendum)
Clinical Social Work Department CLINICAL SOCIAL WORK PLACEMENT NOTE 03/19/2013  Patient:  [name], [name]  Account Number:  [id number] Admit date:  03/11/2013  Clinical Social Worker:  Genelle Bal, LCSW  Date/time:  03/19/2013 04:21 AM  Clinical Social Work is seeking post-discharge placement for this patient at the following level of care:   SKILLED NURSING   (*CSW will update this form in Epic as items are completed)   03/17/2013  Patient/family provided with [facility] Department of Clinical Social Work's list of facilities offering this level of care within the geographic area requested by the patient (or if unable, by the patient's family).  03/17/2013  Patient/family informed of their freedom to choose among providers that offer the needed level of care, that participate in Medicare, Medicaid or managed care program needed by the patient, have an available bed and are willing to accept the patient.    Patient/family informed of [facility]' ownership interest in [facility], as well as of the fact that they are under no obligation to receive care at this facility.  PASARR submitted to EDS on 03/18/2013 PASARR number received from EDS on 03/18/13 - [id number]   FL2 transmitted to all facilities in geographic area requested by pt/family on  03/17/2013 FL2 transmitted to all facilities within larger geographic area on   Patient informed that his/her managed care company has contracts with or will negotiate with  certain facilities, including the following:  [name]   Patient/family informed of bed offers received:  03/18/2013 Patient chooses bed at [facility] Physician recommends and patient chooses bed at    Patient to be transferred to [facility] on 03/22/13   Patient to be transferred to facility by ambulance  The following physician request were entered in Epic:   Additional Comments: 03/22/13 - Family completed admissions paperwork at facility  and requested ambulance transport.

## 2013-03-19 NOTE — Progress Notes (Signed)
[facility] PROGRESS NOTE  [name] WUJ:[id number] DOB: 03-04-1928 DOA: 03/11/2013 PCP: Nicki Reaper, NP From prior note In brief, patient is  a 79 year old gentleman with a past medical history of coronary artery disease, dyslipidemia, hypertension, chronic kidney disease, who had a fall bumping his head on his fireplace. Imaging studies performed in emergent department  showed a subacute on chronic right subdural hematoma.  He was discharged home from the emergency department. Patient was admitted , as of phalanx have reporting minimal by mouth intake as well as some altered mental status over the last several days. A repeat CT scan showed subacute on chronic right subdural hematoma, measuring 8 mm in greatest thickness. There was no specific increased density noted to suggest active hemorrhage.    Subjective - Stood patient up this AM and he reported feeling dizzy. He did not fall nor have near syncope.    Assessment/Plan:  Mental status changes Family members had reported increased confusion at home. He had a fall  after which he presented to the emergent apartment, CT scan showed subacute on chronic right subdural hematoma. A repeat noncontrasted scan of brain overnight did not reveal significant changes. Radiology reporting that there was no significant increased density noted to suggest an active hemorrhage.   - MRI report indicate no change in subdural fluid collection or change in subdural hygroma from prior MRI - Per neurology no need for further  Inpatient  Neurological diagnostic testing and Pt can follow up out patient if choreiform movements continue or worsen or recur   Orthostatic hypotension - have nursing to continue encouraging po fluid intake - Orthostatic blood pressures still indicate that patient is having orthostatic hypotension. -PT to continue to work with pt and per their reports patient has been feeling "sick" when he goes from laying to sitting.  As such  consulted Cardiology for further recommendations given persistent symptoms. Cardiology recommendations pending. - Amlodipine and cozaar continued to be held - currently wearing ted hose and recommend compression stockings once patient transitions home.  Strep pharyngitis Patient reports decreased soreness in throat. Prior testing done with strep antigen  positive. Continue amoxicillin therapy. Today being day 7 of abx therapy as such will discontinue amoxicillin after today. - resolved.  Acute on chronic kidney disease. - suspect decrease in oral intake - Saline lock at this point. Patient eating and drinking - creatinine this am 9/17 treading down to 2.33 from 2.76 on 9/15  Diabetes mellitus - continue SSI - Carb modified diet  Patient has a history of Stage IV CKD, as his GFR at baseline is 26-30.   Code Status: Full Family Communication: No family today at bedside.  Disposition Plan: SNF once family has chosen facility.    Antibiotics:  Amoxicillin >>>9/17   Objective: Filed Vitals:   03/19/13 1633  BP: 135/67  Pulse: 73  Temp: 98.8 F (37.1 C)  Resp: 18    Intake/Output Summary (Last 24 hours) at 03/19/13 1826 Last data filed at 03/19/13 1813  Gross per 24 hour  Intake    960 ml  Output    150 ml  Net    810 ml   Filed Weights   03/16/13 0437 03/17/13 1949 03/18/13 2001  Weight: 79.652 kg (175 lb 9.6 oz) 77.293 kg (170 lb 6.4 oz) 79.062 kg (174 lb 4.8 oz)    Exam:   General:  Patient lying in bed in no acute distress. Pt alert and oriented to person and time but not to place  or situation.   Cardiovascular: Regular rate, rhythm normal S1-S2,  No MRG.  Radial and pedal pulses 2+ with no cyanosis or edema present.   Respiratory: Regular , unlabored, Clear to auscultation bilaterally with no wheezing, rhonchi   Abdomen: Soft, non tender, non distended. Bowel sounds x4  Musculoskeletal:Pt moves all extremities equally with less spontaneous choreiform  movements.   Data Reviewed: Basic Metabolic Panel:  Recent Labs Lab 03/13/13 0650 03/15/13 0720 03/17/13 0453  NA 144 142 141  K 3.8 3.9 3.6  CL 108 108 106  CO2 20 22 23   GLUCOSE 222* 176* 218*  BUN 30* 50* 40*  CREATININE 2.21* 2.76* 2.33*  CALCIUM 9.7 8.9 9.1   Liver Function Tests: No results found for this basename: AST, ALT, ALKPHOS, BILITOT, PROT, ALBUMIN,  in the last 168 hours No results found for this basename: LIPASE, AMYLASE,  in the last 168 hours No results found for this basename: AMMONIA,  in the last 168 hours CBC:  Recent Labs Lab 03/13/13 0650  WBC 8.9  HGB 12.8*  HCT 37.6*  MCV 90.6  PLT 200   Cardiac Enzymes: No results found for this basename: CKTOTAL, CKMB, CKMBINDEX, TROPONINI,  in the last 168 hours BNP (last 3 results) No results found for this basename: PROBNP,  in the last 8760 hours CBG:  Recent Labs Lab 03/18/13 1650 03/18/13 2148 03/19/13 0745 03/19/13 1150 03/19/13 1635  GLUCAP 231* 251* 233* 278* 207*    Recent Results (from the past 240 hour(s))  RAPID STREP SCREEN     Status: Abnormal   Collection Time    03/11/13  4:16 PM      Result Value Range Status   Streptococcus, Group A Screen (Direct) POSITIVE (*) NEGATIVE Final     Studies: No results found.  Scheduled Meds: . aspirin  81 mg Oral Daily  . insulin aspart  0-5 Units Subcutaneous QHS  . insulin aspart  0-9 Units Subcutaneous TID WC  . isosorbide mononitrate  60 mg Oral Daily  . metoprolol  50 mg Oral BID  . pantoprazole  40 mg Oral Daily  . sodium chloride  3 mL Intravenous Q12H   Continuous Infusions:    Principal Problem:   Strep sore throat Active Problems:   Ischemic heart disease   Diabetes mellitus type 2, insulin dependent   Dehydration   CKD (chronic kidney disease)   Syncope   HTN (hypertension)   SDH (subdural hematoma)   Chorea    Time spent: 35 min    [name]  [facility] Pager [phone]. If 7PM-7AM, please  contact night-coverage at [email or web address], password [facility] 03/19/2013, 6:26 PM  LOS: 8 days

## 2013-03-20 DIAGNOSIS — R7309 Other abnormal glucose: Secondary | ICD-10-CM

## 2013-03-20 LAB — GLUCOSE, CAPILLARY
Glucose-Capillary: 198 mg/dL — ABNORMAL HIGH (ref 70–99)
Glucose-Capillary: 294 mg/dL — ABNORMAL HIGH (ref 70–99)
Glucose-Capillary: 277 mg/dL — ABNORMAL HIGH (ref 70–99)
Glucose-Capillary: 143 mg/dL — ABNORMAL HIGH (ref 70–99)

## 2013-03-20 NOTE — Progress Notes (Signed)
[facility] PROGRESS NOTE  [name] AOZ:[id number] DOB: 05/01/1928 DOA: 03/11/2013 PCP: Nicki Reaper, NP From prior note In brief, patient is  a 79 year old gentleman with a past medical history of coronary artery disease, dyslipidemia, hypertension, chronic kidney disease, who had a fall bumping his head on his fireplace. Imaging studies performed in emergent department  showed a subacute on chronic right subdural hematoma.  He was discharged home from the emergency department. Patient was admitted , as of phalanx have reporting minimal by mouth intake as well as some altered mental status over the last several days. A repeat CT scan showed subacute on chronic right subdural hematoma, measuring 8 mm in greatest thickness. There was no specific increased density noted to suggest active hemorrhage.    Subjective - No new complaints this AM.    Assessment/Plan:  Mental status changes At baseline per last check after discussion with family.  Orthostatic hypotension - Cardiology on board, please review their last note on 9/19 for their recommendations - hypertensive medication decreased, amlodipine and ARB discontinued - Echocardiogram pending.   Strep pharyngitis Patient reports decreased soreness in throat. Prior testing done with strep antigen  positive. Continue amoxicillin therapy. Today being day 7 of abx therapy as such will discontinue amoxicillin after today. - resolved.  Acute on chronic kidney disease. - suspected it was due to decrease in oral intake initially - creatinine at baseline on last check  Diabetes mellitus - continue SSI - Carb modified diet  Patient has a history of Stage IV CKD, as his GFR at baseline is 26-30.   Code Status: Full Family Communication: No family today at bedside.  Disposition Plan: SNF once work up complete   Antibiotics:  Amoxicillin >>>9/17   Objective: Filed Vitals:   03/20/13 0627  BP: 150/85  Pulse: 65  Temp: 98.6  F (37 C)  Resp: 20    Intake/Output Summary (Last 24 hours) at 03/20/13 1210 Last data filed at 03/19/13 1813  Gross per 24 hour  Intake    360 ml  Output      0 ml  Net    360 ml   Filed Weights   03/17/13 1949 03/18/13 2001 03/19/13 2106  Weight: 77.293 kg (170 lb 6.4 oz) 79.062 kg (174 lb 4.8 oz) 79.47 kg (175 lb 3.2 oz)    Exam:   General:  Patient lying in bed in no acute distress. Pt alert and oriented to person and time but not to place or situation.   Cardiovascular: Regular rate, rhythm normal S1-S2,  No MRG.  Radial and pedal pulses 2+ with no cyanosis or edema present.   Respiratory: Regular , unlabored, Clear to auscultation bilaterally with no wheezing, rhonchi   Abdomen: Soft, non tender, non distended. Bowel sounds x4  Musculoskeletal:Pt moves all extremities equally, no cyanosis or clubbing  Data Reviewed: Basic Metabolic Panel:  Recent Labs Lab 03/15/13 0720 03/17/13 0453  NA 142 141  K 3.9 3.6  CL 108 106  CO2 22 23  GLUCOSE 176* 218*  BUN 50* 40*  CREATININE 2.76* 2.33*  CALCIUM 8.9 9.1   Liver Function Tests: No results found for this basename: AST, ALT, ALKPHOS, BILITOT, PROT, ALBUMIN,  in the last 168 hours No results found for this basename: LIPASE, AMYLASE,  in the last 168 hours No results found for this basename: AMMONIA,  in the last 168 hours CBC: No results found for this basename: WBC, NEUTROABS, HGB, HCT, MCV, PLT,  in the last 168  hours Cardiac Enzymes: No results found for this basename: CKTOTAL, CKMB, CKMBINDEX, TROPONINI,  in the last 168 hours BNP (last 3 results) No results found for this basename: PROBNP,  in the last 8760 hours CBG:  Recent Labs Lab 03/19/13 1150 03/19/13 1635 03/19/13 2102 03/20/13 0728 03/20/13 1136  GLUCAP 278* 207* 219* 198* 294*    Recent Results (from the past 240 hour(s))  RAPID STREP SCREEN     Status: Abnormal   Collection Time    03/11/13  4:16 PM      Result Value Range Status    Streptococcus, Group A Screen (Direct) POSITIVE (*) NEGATIVE Final     Studies: No results found.  Scheduled Meds: . aspirin  81 mg Oral Daily  . insulin aspart  0-5 Units Subcutaneous QHS  . insulin aspart  0-9 Units Subcutaneous TID WC  . isosorbide mononitrate  30 mg Oral Daily  . metoprolol  12.5 mg Oral BID  . pantoprazole  40 mg Oral Daily  . sodium chloride  3 mL Intravenous Q12H   Continuous Infusions:    Principal Problem:   Strep sore throat Active Problems:   Ischemic heart disease   Diabetes mellitus type 2, insulin dependent   Dehydration   CKD (chronic kidney disease)   Syncope   HTN (hypertension)   SDH (subdural hematoma)   Chorea    Time spent: 35 min    [name]  [facility] Pager [phone]. If 7PM-7AM, please contact night-coverage at [email or web address], password [facility] 03/20/2013, 12:10 PM  LOS: 9 days

## 2013-03-21 DIAGNOSIS — R55 Syncope and collapse: Secondary | ICD-10-CM

## 2013-03-21 DIAGNOSIS — Z862 Personal history of diseases of the blood and blood-forming organs and certain disorders involving the immune mechanism: Secondary | ICD-10-CM

## 2013-03-21 LAB — GLUCOSE, CAPILLARY
Glucose-Capillary: 208 mg/dL — ABNORMAL HIGH (ref 70–99)
Glucose-Capillary: 227 mg/dL — ABNORMAL HIGH (ref 70–99)

## 2013-03-21 NOTE — Progress Notes (Signed)
Patient ID: [name], male   DOB: Jun 02, 1928, 79 y.o.   MRN: [id number] Subjective:  No chest pain or sob. Able to swallow.  Objective:  Vital Signs in the last 24 hours: Temp:  [98.4 F (36.9 C)-98.9 F (37.2 C)] 98.5 F (36.9 C) (09/21 0600) Pulse Rate:  [56-80] 80 (09/21 0600) Resp:  [18] 18 (09/21 0600) BP: (109-137)/(63-78) 109/72 mmHg (09/21 0600) SpO2:  [91 %-99 %] 91 % (09/21 0600) Weight:  [176 lb (79.833 kg)] 176 lb (79.833 kg) (09/20 2136)  Intake/Output from previous day:   Intake/Output from this shift:    Physical Exam: Well appearing elderly man, NAD HEENT: Unremarkable Neck:  6 cm JVD, no thyromegally Back:  No CVA tenderness Lungs:  Clear with no wheezes HEART:  Regular rate rhythm, no murmurs, no rubs, no clicks Abd:  Flat, positive bowel sounds, no organomegally, no rebound, no guarding Ext:  2 plus pulses, no edema, no cyanosis, no clubbing Skin:  No rashes no nodules Neuro:  CN II through XII intact, motor grossly intact  Lab Results: No results found for this basename: WBC, HGB, PLT,  in the last 72 hours No results found for this basename: NA, K, CL, CO2, GLUCOSE, BUN, CREATININE,  in the last 72 hours No results found for this basename: TROPONINI, CK, MB,  in the last 72 hours Hepatic Function Panel No results found for this basename: PROT, ALBUMIN, AST, ALT, ALKPHOS, BILITOT, BILIDIR, IBILI,  in the last 72 hours No results found for this basename: CHOL,  in the last 72 hours No results found for this basename: PROTIME,  in the last 72 hours  Imaging: No results found.  Cardiac Studies: Echo - pending Assessment/Plan:  1. Syncope due to orthostasis due to dehydration 2. Strep pharyngitis Rec: see Dr. Alford Highland recs. Ok to discharge from cardiac perspective once able to ambulate and swallow. Dc on reduce blood pressure lowering meds.  LOS: 10 days    Gregg Taylor,M.D. 03/21/2013, 8:10 AM

## 2013-03-21 NOTE — Progress Notes (Signed)
  Echocardiogram 2D Echocardiogram has been performed.  [name] 03/21/2013, 12:23 PM

## 2013-03-21 NOTE — Progress Notes (Signed)
[facility] PROGRESS NOTE  [name] WUJ:[id number] DOB: Jan 04, 1928 DOA: 03/11/2013 PCP: Nicki Reaper, NP From prior note In brief, patient is  a 79 year old gentleman with a past medical history of coronary artery disease, dyslipidemia, hypertension, chronic kidney disease, who had a fall bumping his head on his fireplace. Imaging studies performed in emergent department  showed a subacute on chronic right subdural hematoma.  He was discharged home from the emergency department. Patient was admitted , as of phalanx have reporting minimal by mouth intake as well as some altered mental status over the last several days. A repeat CT scan showed subacute on chronic right subdural hematoma, measuring 8 mm in greatest thickness. There was no specific increased density noted to suggest active hemorrhage.    Subjective - No new complaints this AM.    Assessment/Plan:  Mental status changes At baseline per last check after discussion with family.  Orthostatic hypotension - Cardiology on board, please review their last note for their recommendations - hypertensive medication decreased, amlodipine and ARB discontinued - Echocardiogram results pending but completed cardiology to review.   Strep pharyngitis Patient reports decreased soreness in throat. Prior testing done with strep antigen  positive. Continue amoxicillin therapy. Today being day 7 of abx therapy as such will discontinue amoxicillin after today. - resolved.  Acute on chronic kidney disease. - suspected it was due to decrease in oral intake initially - creatinine at baseline on last check  Diabetes mellitus - continue SSI - Carb modified diet  Patient has a history of Stage IV CKD, as his GFR at baseline is 26-30.   Code Status: Full Family Communication: No family today at bedside.  Disposition Plan: SNF once work up complete   Antibiotics:  Amoxicillin >>>9/17   Objective: Filed Vitals:   03/21/13 0600   BP: 109/72  Pulse: 80  Temp: 98.5 F (36.9 C)  Resp: 18    Intake/Output Summary (Last 24 hours) at 03/21/13 1232 Last data filed at 03/21/13 1100  Gross per 24 hour  Intake      0 ml  Output    125 ml  Net   -125 ml   Filed Weights   03/18/13 2001 03/19/13 2106 03/20/13 2136  Weight: 79.062 kg (174 lb 4.8 oz) 79.47 kg (175 lb 3.2 oz) 79.833 kg (176 lb)    Exam:   General:  Patient lying in bed in no acute distress. Pt alert and oriented to person and time but not to place or situation.   Cardiovascular: Regular rate, rhythm normal S1-S2,  No MRG.  Radial and pedal pulses 2+ with no cyanosis or edema present.   Respiratory: Regular , unlabored, Clear to auscultation bilaterally with no wheezing, rhonchi   Abdomen: Soft, non tender, non distended. Bowel sounds x4  Musculoskeletal:Pt moves all extremities equally, no cyanosis or clubbing  Data Reviewed: Basic Metabolic Panel:  Recent Labs Lab 03/15/13 0720 03/17/13 0453  NA 142 141  K 3.9 3.6  CL 108 106  CO2 22 23  GLUCOSE 176* 218*  BUN 50* 40*  CREATININE 2.76* 2.33*  CALCIUM 8.9 9.1   Liver Function Tests: No results found for this basename: AST, ALT, ALKPHOS, BILITOT, PROT, ALBUMIN,  in the last 168 hours No results found for this basename: LIPASE, AMYLASE,  in the last 168 hours No results found for this basename: AMMONIA,  in the last 168 hours CBC: No results found for this basename: WBC, NEUTROABS, HGB, HCT, MCV, PLT,  in the last  168 hours Cardiac Enzymes: No results found for this basename: CKTOTAL, CKMB, CKMBINDEX, TROPONINI,  in the last 168 hours BNP (last 3 results) No results found for this basename: PROBNP,  in the last 8760 hours CBG:  Recent Labs Lab 03/20/13 1136 03/20/13 1622 03/20/13 2133 03/21/13 0730 03/21/13 1128  GLUCAP 294* 277* 143* 208* 239*    Recent Results (from the past 240 hour(s))  RAPID STREP SCREEN     Status: Abnormal   Collection Time    03/11/13  4:16 PM       Result Value Range Status   Streptococcus, Group A Screen (Direct) POSITIVE (*) NEGATIVE Final     Studies: No results found.  Scheduled Meds: . aspirin  81 mg Oral Daily  . insulin aspart  0-5 Units Subcutaneous QHS  . insulin aspart  0-9 Units Subcutaneous TID WC  . isosorbide mononitrate  30 mg Oral Daily  . metoprolol  12.5 mg Oral BID  . pantoprazole  40 mg Oral Daily  . sodium chloride  3 mL Intravenous Q12H   Continuous Infusions:    Principal Problem:   Strep sore throat Active Problems:   Ischemic heart disease   Diabetes mellitus type 2, insulin dependent   Dehydration   CKD (chronic kidney disease)   Syncope   HTN (hypertension)   SDH (subdural hematoma)   Chorea    Time spent: 35 min    [name]  [facility] Pager [phone]. If 7PM-7AM, please contact night-coverage at [email or web address], password [facility] 03/21/2013, 12:32 PM  LOS: 10 days

## 2013-03-22 LAB — GLUCOSE, CAPILLARY
Glucose-Capillary: 184 mg/dL — ABNORMAL HIGH (ref 70–99)
Glucose-Capillary: 372 mg/dL — ABNORMAL HIGH (ref 70–99)

## 2013-03-22 MED ORDER — METOPROLOL TARTRATE 12.5 MG HALF TABLET: 12.5000 mg | ORAL_TABLET | Freq: Two times a day (BID) | ORAL | Status: DC

## 2013-03-22 MED ORDER — ISOSORBIDE MONONITRATE ER 30 MG PO TB24: 30.0000 mg | ORAL_TABLET | Freq: Every day | ORAL | Status: DC

## 2013-03-22 NOTE — Progress Notes (Signed)
Inpatient Diabetes Program Recommendations  AACE/ADA: New Consensus Statement on Inpatient Glycemic Control (2013)  Target Ranges:  Prepandial:   less than 140 mg/dL      Peak postprandial:   less than 180 mg/dL (1-2 hours)      Critically ill patients:  140 - 180 mg/dL     Results for LAEL, PILCH (MRN [id number]) as of 03/22/2013 10:04  Ref. Range 03/21/2013 07:30 03/21/2013 11:28 03/21/2013 16:40 03/21/2013 22:22  Glucose-Capillary Latest Range: 70-99 mg/dL 409 (H) 811 (H) 914 (H) 227 (H)    Results for ZAKAR, BROSCH (MRN [id number]) as of 03/22/2013 10:04  Ref. Range 03/22/2013 07:36  Glucose-Capillary Latest Range: 70-99 mg/dL 086 (H)    Patient continues to have hyperglycemia.   MD- Please consider starting basal insulin- Levemir 10 units QHS   Will follow. Ambrose Finland RN, MSN, CDE Diabetes Coordinator Inpatient Diabetes Program Team Pager: [phone] (8a-10p)

## 2013-03-22 NOTE — Progress Notes (Signed)
   Subjective:  No chest pain. Feels "sick" this am and complains of his stomach hurting.  Objective:  Vital Signs in the last 24 hours: Temp:  [98.4 F (36.9 C)] 98.4 F (36.9 C) (09/22 0554) Pulse Rate:  [56-69] 69 (09/22 0554) Resp:  [18-20] 18 (09/22 0554) BP: (138-154)/(65-76) 138/65 mmHg (09/22 0554) SpO2:  [97 %-99 %] 99 % (09/22 0554)  Intake/Output from previous day: 09/21 0701 - 09/22 0700 In: 600 [P.O.:600] Out: 725 [Urine:725] Intake/Output from this shift:    . aspirin  81 mg Oral Daily  . insulin aspart  0-5 Units Subcutaneous QHS  . insulin aspart  0-9 Units Subcutaneous TID WC  . isosorbide mononitrate  30 mg Oral Daily  . metoprolol  12.5 mg Oral BID  . pantoprazole  40 mg Oral Daily  . sodium chloride  3 mL Intravenous Q12H      Physical Exam: The patient appears to be in no distress.  Head and neck exam reveals that the pupils are equal and reactive.  The extraocular movements are full.  There is no scleral icterus.  Mouth and pharynx are benign.  No lymphadenopathy.  No carotid bruits.  The jugular venous pressure is normal.  Thyroid is not enlarged or tender.  Chest is clear to percussion and auscultation.  No rales or rhonchi.  Expansion of the chest is symmetrical.  Heart reveals no abnormal lift or heave.  First and second heart sounds are normal.  There is no murmur gallop rub or click.  The abdomen is soft and nontender.  Bowel sounds are normoactive.  There is no hepatosplenomegaly or mass.  There are no abdominal bruits.  Extremities reveal no phlebitis or edema.  Pedal pulses are good.  There is no cyanosis or clubbing.  Neurologic exam is normal strength and no lateralizing weakness.  No sensory deficits.  Integument reveals no rash  Lab Results: No results found for this basename: WBC, HGB, PLT,  in the last 72 hours No results found for this basename: NA, K, CL, CO2, GLUCOSE, BUN, CREATININE,  in the last 72 hours No results found for  this basename: TROPONINI, CK, MB,  in the last 72 hours Hepatic Function Panel No results found for this basename: PROT, ALBUMIN, AST, ALT, ALKPHOS, BILITOT, BILIDIR, IBILI,  in the last 72 hours No results found for this basename: CHOL,  in the last 72 hours No results found for this basename: PROTIME,  in the last 72 hours  Imaging: No results found.  Cardiac Studies: 2D echo pending. Assessment/Plan:  1. Syncope due to orthostasis due to dehydration  2. Strep pharyngitis 3. Nausea and abdominal pain: per primary service.  Plan: He is now on reduced amount of BP meds which should help prevent future episodes of syncope.  No longer on amlodipine or ARB.  LOS: 11 days    Cassell Clement 03/22/2013, 8:23 AM

## 2013-03-22 NOTE — Progress Notes (Signed)
Physical Therapy Treatment Patient Details Name: [name] MRN: [id number] DOB: 1927-11-10 Today's Date: 03/22/2013 Time: 4098-1191 PT Time Calculation (min): 16 min  PT Assessment / Plan / Recommendation  History of Present Illness In brief patient is is an 79 year old gentleman with a past medical history of coronary artery disease, dyslipidemia, hypertension, chronic kidney disease, who had a fall 2 days ago bumping his head on his fireplace. Imaging studies performed in emergent department  showed a subacute on chronic right subdural hematoma.  He was discharged home from the emergency department. Patient was admitted overnight, as of phalanx have reporting minimal by mouth intake as well as some altered mental status over the last several days. A repeat CT scan showed subacute on chronic right subdural hematoma, measuring 8 mm in greatest thickness. There was no specific increased density noted to suggest active hemorrhage. Patient this morning reporting feeling "sick" especially going from laying to sitting. He was orthostatic, and bolused with a liter of normal saline. Pt also positive for strep throat.   PT Comments   Pt progressing towards physical therapy goals. He was able to perform functional mobility with decreased assist compared to previous session, and balance appeared improved. Pt's glucose was increased during time of therapy and was 372 mg/dL. Pt appeared to become slightly less verbally responsive during end of gait training, and required repeating a question to receive an answer. He became more verbal once seated in bedside chair.  Follow Up Recommendations  SNF     Does the patient have the potential to tolerate intense rehabilitation     Barriers to Discharge        Equipment Recommendations  Rolling walker with 5" wheels    Recommendations for Other Services    Frequency Min 3X/week   Progress towards PT Goals Progress towards PT goals: Progressing toward goals   Plan Current plan remains appropriate    Precautions / Restrictions Precautions Precautions: Fall Restrictions Weight Bearing Restrictions: No   Pertinent Vitals/Pain Pt did not report any pain during session, and when asked, he said he had no complaints.    Mobility  Bed Mobility Bed Mobility: Supine to Sit;Sitting - Scoot to Edge of Bed Supine to Sit: 6: Modified independent (Device/Increase time);With rails Sitting - Scoot to Edge of Bed: 6: Modified independent (Device/Increase time);With rail Details for Bed Mobility Assistance: Pt was able to perform transfer to EOB with safety awareness and use of bedrails for assist. No physical assist needed from therapist. Transfers Transfers: Sit to Stand;Stand to Sit Sit to Stand: 4: Min guard;From bed;With upper extremity assist Stand to Sit: 4: Min guard;To chair/3-in-1;With armrests Details for Transfer Assistance: Pt was cued for hand placement on seated surface for support prior to initiating transfers Ambulation/Gait Ambulation/Gait Assistance: 4: Min guard Ambulation Distance (Feet): 125 Feet Assistive device: Rolling walker Ambulation/Gait Assistance Details: Pt was cued for sequencing and safety awareness with the walker, especially during turns. VC's for increased step/stride length. Gait Pattern: Step-through pattern;Decreased stride length;Narrow base of support Gait velocity: decreased Stairs: No Wheelchair Mobility Wheelchair Mobility: No    Exercises     PT Diagnosis:    PT Problem List:   PT Treatment Interventions:     PT Goals (current goals can now be found in the care plan section) Acute Rehab PT Goals Patient Stated Goal: Pt didn't state PT Goal Formulation: Patient unable to participate in goal setting Time For Goal Achievement: 03/22/13 Potential to Achieve Goals: Good  Visit Information  Last  PT Received On: 03/22/13 Assistance Needed: +1 History of Present Illness: In brief patient is is an  79 year old gentleman with a past medical history of coronary artery disease, dyslipidemia, hypertension, chronic kidney disease, who had a fall 2 days ago bumping his head on his fireplace. Imaging studies performed in emergent department  showed a subacute on chronic right subdural hematoma.  He was discharged home from the emergency department. Patient was admitted overnight, as of phalanx have reporting minimal by mouth intake as well as some altered mental status over the last several days. A repeat CT scan showed subacute on chronic right subdural hematoma, measuring 8 mm in greatest thickness. There was no specific increased density noted to suggest active hemorrhage. Patient this morning reporting feeling "sick" especially going from laying to sitting. He was orthostatic, and bolused with a liter of normal saline. Pt also positive for strep throat.    Subjective Data  Subjective: "I think I need to use the bathroom." Patient Stated Goal: Pt didn't state   Cognition  Cognition Arousal/Alertness: Awake/alert Behavior During Therapy: WFL for tasks assessed/performed Overall Cognitive Status: No family/caregiver present to determine baseline cognitive functioning Area of Impairment: Orientation;Problem solving Problem Solving: Slow processing;Requires verbal cues (repeated)    Balance  Balance Balance Assessed: Yes Static Sitting Balance Static Sitting - Balance Support: No upper extremity supported;Feet supported Static Sitting - Level of Assistance: 5: Stand by assistance Static Sitting - Comment/# of Minutes: 2 Static Standing Balance Static Standing - Balance Support: Bilateral upper extremity supported Static Standing - Level of Assistance: 5: Stand by assistance Static Standing - Comment/# of Minutes: 1  End of Session PT - End of Session Equipment Utilized During Treatment: Gait belt Activity Tolerance: Patient tolerated treatment well Patient left: in bed;with call bell/phone  within reach Nurse Communication: Mobility status   GP     Ruthann Cancer 03/22/2013, 3:17 PM  Ruthann Cancer, PT Acute Rehabilitation Services

## 2013-03-30 ENCOUNTER — Emergency Department (HOSPITAL_BASED_OUTPATIENT_CLINIC_OR_DEPARTMENT_OTHER): Payer: Medicare PPO

## 2013-03-30 ENCOUNTER — Encounter (HOSPITAL_BASED_OUTPATIENT_CLINIC_OR_DEPARTMENT_OTHER): Payer: Self-pay | Admitting: *Deleted

## 2013-03-30 ENCOUNTER — Emergency Department (HOSPITAL_BASED_OUTPATIENT_CLINIC_OR_DEPARTMENT_OTHER)
Admission: EM | Admit: 2013-03-30 | Discharge: 2013-03-30 | Disposition: A | Discharge status: Skilled Nursing Facility | Payer: Medicare PPO | Attending: Emergency Medicine | Admitting: Emergency Medicine

## 2013-03-30 DIAGNOSIS — Y9210 Unspecified residential institution as the place of occurrence of the external cause: Secondary | ICD-10-CM | POA: Insufficient documentation

## 2013-03-30 DIAGNOSIS — S0990XA Unspecified injury of head, initial encounter: Secondary | ICD-10-CM | POA: Insufficient documentation

## 2013-03-30 DIAGNOSIS — Z862 Personal history of diseases of the blood and blood-forming organs and certain disorders involving the immune mechanism: Secondary | ICD-10-CM | POA: Insufficient documentation

## 2013-03-30 DIAGNOSIS — Z8673 Personal history of transient ischemic attack (TIA), and cerebral infarction without residual deficits: Secondary | ICD-10-CM | POA: Insufficient documentation

## 2013-03-30 DIAGNOSIS — W1809XA Striking against other object with subsequent fall, initial encounter: Secondary | ICD-10-CM | POA: Insufficient documentation

## 2013-03-30 DIAGNOSIS — Y939 Activity, unspecified: Secondary | ICD-10-CM | POA: Insufficient documentation

## 2013-03-30 DIAGNOSIS — Z7982 Long term (current) use of aspirin: Secondary | ICD-10-CM | POA: Insufficient documentation

## 2013-03-30 DIAGNOSIS — Z79899 Other long term (current) drug therapy: Secondary | ICD-10-CM | POA: Insufficient documentation

## 2013-03-30 DIAGNOSIS — K219 Gastro-esophageal reflux disease without esophagitis: Secondary | ICD-10-CM | POA: Insufficient documentation

## 2013-03-30 DIAGNOSIS — Z8639 Personal history of other endocrine, nutritional and metabolic disease: Secondary | ICD-10-CM | POA: Insufficient documentation

## 2013-03-30 DIAGNOSIS — S0181XA Laceration without foreign body of other part of head, initial encounter: Secondary | ICD-10-CM

## 2013-03-30 DIAGNOSIS — S0180XA Unspecified open wound of other part of head, initial encounter: Secondary | ICD-10-CM | POA: Insufficient documentation

## 2013-03-30 DIAGNOSIS — I129 Hypertensive chronic kidney disease with stage 1 through stage 4 chronic kidney disease, or unspecified chronic kidney disease: Secondary | ICD-10-CM | POA: Insufficient documentation

## 2013-03-30 DIAGNOSIS — I2510 Atherosclerotic heart disease of native coronary artery without angina pectoris: Secondary | ICD-10-CM | POA: Insufficient documentation

## 2013-03-30 DIAGNOSIS — W19XXXA Unspecified fall, initial encounter: Secondary | ICD-10-CM

## 2013-03-30 DIAGNOSIS — Z951 Presence of aortocoronary bypass graft: Secondary | ICD-10-CM | POA: Insufficient documentation

## 2013-03-30 DIAGNOSIS — E119 Type 2 diabetes mellitus without complications: Secondary | ICD-10-CM | POA: Insufficient documentation

## 2013-03-30 DIAGNOSIS — Z8619 Personal history of other infectious and parasitic diseases: Secondary | ICD-10-CM | POA: Insufficient documentation

## 2013-03-30 DIAGNOSIS — N189 Chronic kidney disease, unspecified: Secondary | ICD-10-CM | POA: Insufficient documentation

## 2013-03-30 DIAGNOSIS — W050XXA Fall from non-moving wheelchair, initial encounter: Secondary | ICD-10-CM | POA: Insufficient documentation

## 2013-03-30 LAB — GLUCOSE, CAPILLARY: Glucose-Capillary: 149 mg/dL — ABNORMAL HIGH (ref 70–99)

## 2013-03-30 NOTE — ED Notes (Signed)
Patient transported to CT 

## 2013-03-30 NOTE — ED Notes (Signed)
Leaned over in w/c and slid out, pt has small lac over left eye. No LOC per staff at [facility].

## 2013-03-30 NOTE — ED Notes (Signed)
Call placed for pt transport to [facility].

## 2013-03-30 NOTE — ED Notes (Signed)
MD at bedside. 

## 2013-03-30 NOTE — ED Provider Notes (Signed)
CSN: [id number]     Arrival date & time 03/30/13  0411 History   First MD Initiated Contact with Patient 03/30/13 0421     Chief Complaint  Patient presents with  . Fall  . Laceration   (Consider location/radiation/quality/duration/timing/severity/associated sxs/prior Treatment) HPI Comments: Patient is an 79 year old male with extensive past medical history including dementia. Is brought to the emergency department after for a wheelchair sliding out and hitting his for head. There is a laceration above the left eye. There is no reported loss of consciousness. The patient has a history of dementia and has little to the history. A level 5 caveat  therefore applies.  Patient is a 79 y.o. male presenting with fall and skin laceration. The history is provided by the patient.  Fall This is a new problem. The current episode started less than 1 hour ago. The problem occurs constantly. The problem has not changed since onset.Nothing aggravates the symptoms. Nothing relieves the symptoms. He has tried nothing for the symptoms. The treatment provided no relief.  Laceration   Past Medical History  Diagnosis Date  . Coronary artery disease     post CABG in 1997 with abnormal nuclear stress test in 2007 which did demonstrate mild reversible ischemia and has been managed medically.   . Diabetes mellitus     type 2  . Hypertension   . Hyperlipidemia   . Hypercholesterolemia   . Atypical chest pain   . Hypernatremia   . History of hypoglycemia   . Syncope   . CVA (cerebral infarction)   . Allergy   . History of chicken pox   . Acute on chronic renal insufficiency   . CKD (chronic kidney disease)   . GERD (gastroesophageal reflux disease)   . H/O hiatal hernia    Past Surgical History  Procedure Laterality Date  . Coronary artery bypass graft  1997    x3 -- patent internal mammary graft to saphenous vein grafts x3  . Tonsillectomy    . Cardiac catheterization  01/26/1999    patent  internal mammary graft to saphenous vein grafts x3 -- singnificant three-vessel coroanry artery disease -- potential sites of ischemia involving the intermediate branch, circumflex branch and the distal LAD through diffuse disease, as well as steal type syndrome -- normal LV function -- W. Ashley Royalty., M.D.    . Esophagogastroduodenoscopy  07/22/2012    Procedure: ESOPHAGOGASTRODUODENOSCOPY (EGD);  Surgeon: Theda Belfast, MD;  Location: [facility];  Service: Endoscopy;  Laterality: N/A;  . Colonoscopy  07/22/2012    Procedure: COLONOSCOPY;  Surgeon: Theda Belfast, MD;  Location: [facility];  Service: Endoscopy;  Laterality: N/A;   Family History  Problem Relation Age of Onset  . Hypertension Other     Parent  . Diabetes Other     Parent  . Hyperlipidemia Other     Parent   History  Substance Use Topics  . Smoking status: Never Smoker   . Smokeless tobacco: Never Used  . Alcohol Use: No    Review of Systems  All other systems reviewed and are negative.    Allergies  Review of patient's allergies indicates no known allergies.  Home Medications   Current Outpatient Rx  Name  Route  Sig  Dispense  Refill  . aspirin 81 MG tablet   Oral   Take 81 mg by mouth daily.         Marland Kitchen glipiZIDE (GLUCOTROL) 10 MG tablet   Oral  Take 1 tablet (10 mg total) by mouth 2 (two) times daily before a meal.   60 tablet   3   . isosorbide mononitrate (IMDUR) 30 MG 24 hr tablet   Oral   Take 60 mg by mouth daily.         . metoprolol tartrate (LOPRESSOR) 12.5 mg TABS tablet   Oral   Take 50 mg by mouth 2 (two) times daily.         Marland Kitchen omeprazole (PRILOSEC) 20 MG capsule   Oral   Take 20 mg by mouth daily.         . traMADol (ULTRAM) 50 MG tablet   Oral   Take 1 tablet (50 mg total) by mouth every 8 (eight) hours as needed for pain.   75 tablet   0    BP 132/75  Pulse 72  Temp(Src) 98.2 F (36.8 C)  Resp 16  Ht 6\' 2"  (1.88 m)  Wt 170 lb (77.111 kg)  BMI  21.82 kg/m2  SpO2 100% Physical Exam  Nursing note reviewed. Constitutional: He appears well-developed and well-nourished. No distress.  HENT:  Head: Normocephalic.  Mouth/Throat: Oropharynx is clear and moist.  There is a 1.5 cm laceration just above the left eyebrow.  Eyes: EOM are normal. Pupils are equal, round, and reactive to light.  Neck: Normal range of motion. Neck supple.  Cardiovascular: Normal rate and regular rhythm.   No murmur heard. Pulmonary/Chest: Effort normal and breath sounds normal.  Musculoskeletal: Normal range of motion. He exhibits no edema.  Neurological: He is alert. No cranial nerve deficit.  Patient moves all 4 extremities. Neurologic exam is somewhat difficult due to the history of dementia.  Skin: Skin is warm. He is not diaphoretic.    ED Course  Procedures (including critical care time) Labs Review Labs Reviewed - No data to display Imaging Review No results found.  LACERATION REPAIR Performed by: Geoffery Lyons Authorized by: Geoffery Lyons Consent: Verbal consent obtained. Risks and benefits: risks, benefits and alternatives were discussed Consent given by: patient Patient identity confirmed: provided demographic data Prepped and Draped in normal sterile fashion Wound explored  Laceration Location: Left eyebrow  Laceration Length: 1.5 cm  No Foreign Bodies seen or palpated  Anesthesia: local infiltration  Local anesthetic: lidocaine 1 % with epinephrine  Anesthetic total: 2 ml  Irrigation method: syringe Amount of cleaning: standard  Skin closure: 6-0 Prolene   Number of sutures: 3   Technique: Simple interrupted   Patient tolerance: Patient tolerated the procedure well with no immediate complications.   MDM  No diagnosis found. Patient was brought here after a fall at the nursing home. He sustained a small laceration above the left eyebrow which was repaired. CT scan of the head is unremarkable for intracranial  hemorrhage. He has dementia and is difficult to get a good neurologic exam out of however from what I can tell he is at his baseline. Will discharge to home with instructions for local wound care and suture removal in 5-7 days.    Geoffery Lyons, MD 03/30/13 [phone]

## 2013-03-30 NOTE — ED Notes (Signed)
Attempted to call report to Autumn Messing x2 without success.

## 2013-04-02 ENCOUNTER — Encounter: Payer: Self-pay | Admitting: Cardiology

## 2013-04-02 ENCOUNTER — Telehealth: Payer: Self-pay | Admitting: Cardiology

## 2013-04-02 ENCOUNTER — Encounter: Payer: Self-pay | Admitting: Nurse Practitioner

## 2013-04-02 ENCOUNTER — Ambulatory Visit (INDEPENDENT_AMBULATORY_CARE_PROVIDER_SITE_OTHER): Payer: Medicare PPO | Admitting: Nurse Practitioner

## 2013-04-02 VITALS — BP 110/60 | HR 60 | Ht 73.0 in | Wt 173.8 lb

## 2013-04-02 DIAGNOSIS — I2510 Atherosclerotic heart disease of native coronary artery without angina pectoris: Secondary | ICD-10-CM

## 2013-04-02 NOTE — Progress Notes (Signed)
Breck [place] Date of Birth: [birth year] Medical Record #[id number]  History of Present Illness: Mr. Aversa is seen back today for a post hospital visit. Seen for Dr. Patty Sermons. Has a multitude of issues which include remote CABG for CAD in 1997, cath in 2000, abnormal nuclear in 2007 and has been managed medically. Other issues include DM, HTN, past CVA, CKD and HLD.   Last seen by me in June of 2013. Saw Dr. Patty Sermons in October of 2013 - that was after an admission for syncope in which he had a thorough work up and noted to be the result of hypoglycemia.  Most recently in the hospital with altered mental status. Had had poor oral intake and a sore throat - had strep. Had had a prior fall and noted to have subdural hematoma - CT with subacute on chronic right subdural hematoma. No active hemorrhage noted. During that admission he had orthostasis and his beta blocker and Imdur were decreased. CCB and ARB were both stopped.   Back in the ER 3 days ago after sliding out of his wheelchair and sustaining a laceration to the head.   Comes back today. Here with family. Needs lots of assistance and direction. Just out of the SNF as of yesterday. Daughter concerned about medicines - she is only giving him 4 pills - med list adjusted. No aspirin. Hasn't been home long enough to see if there are issues. Seems confused at times. No reports of chest pain or shortness of breath. He does not know why he is here or where he is at. He does know the month. PT to come out today to see him. Seeing PCP on Monday.   Current Outpatient Prescriptions  Medication Sig Dispense Refill  . glipiZIDE (GLUCOTROL) 10 MG tablet Take 1 tablet (10 mg total) by mouth 2 (two) times daily before a meal.  60 tablet  3  . isosorbide mononitrate (IMDUR) 30 MG 24 hr tablet Take 60 mg by mouth daily.      . metoprolol (LOPRESSOR) 50 MG tablet Take 50 mg by mouth 2 (two) times daily.      Marland Kitchen omeprazole (PRILOSEC) 20 MG capsule Take 20 mg  by mouth daily.       No current facility-administered medications for this visit.    No Known Allergies  Past Medical History  Diagnosis Date  . Coronary artery disease     post CABG in 1997 with abnormal nuclear stress test in 2007 which did demonstrate mild reversible ischemia and has been managed medically.   . Diabetes mellitus     type 2  . Hypertension   . Hyperlipidemia   . Hypercholesterolemia   . Atypical chest pain   . Hypernatremia   . History of hypoglycemia   . Syncope   . CVA (cerebral infarction)   . Allergy   . History of chicken pox   . Acute on chronic renal insufficiency   . CKD (chronic kidney disease)   . GERD (gastroesophageal reflux disease)   . H/O hiatal hernia     Past Surgical History  Procedure Laterality Date  . Coronary artery bypass graft  1997    x3 -- patent internal mammary graft to saphenous vein grafts x3  . Tonsillectomy    . Cardiac catheterization  01/26/1999    patent internal mammary graft to saphenous vein grafts x3 -- singnificant three-vessel coroanry artery disease -- potential sites of ischemia involving the intermediate branch, circumflex branch and the  distal LAD through diffuse disease, as well as steal type syndrome -- normal LV function -- W. Ashley Royalty., M.D.    . Esophagogastroduodenoscopy  07/22/2012    Procedure: ESOPHAGOGASTRODUODENOSCOPY (EGD);  Surgeon: Theda Belfast, MD;  Location: [facility];  Service: Endoscopy;  Laterality: N/A;  . Colonoscopy  07/22/2012    Procedure: COLONOSCOPY;  Surgeon: Theda Belfast, MD;  Location: [facility];  Service: Endoscopy;  Laterality: N/A;    History  Smoking status  . Never Smoker   Smokeless tobacco  . Never Used    History  Alcohol Use No    Family History  Problem Relation Age of Onset  . Hypertension Other     Parent  . Diabetes Other     Parent  . Hyperlipidemia Other     Parent    Review of Systems: The review of systems is per the HPI.  All  other systems were reviewed and are negative.  Physical Exam: BP 110/60  Pulse 60  Ht 6\' 1"  (1.854 m)  Wt 173 lb 12.8 oz (78.835 kg)  BMI 22.94 kg/m2 Patient is pleasant and in no acute distress. Very confused - tried to sit in the nurses station. Needs direction. Skin is warm and dry. Color is normal.  HEENT is unremarkable. Normocephalic/atraumatic. PERRL. Sclera are nonicteric. Neck is supple. No masses. No JVD. Lungs are fairly clear. Cardiac exam shows a regular rate and rhythm. Abdomen is soft. Extremities are without edema. Gait and ROM are intact but quite unsteady. Has a walker that is not functional. No gross neurologic deficits noted.  LABORATORY DATA: Lab Results  Component Value Date   WBC 8.9 03/13/2013   HGB 12.8* 03/13/2013   HCT 37.6* 03/13/2013   PLT 200 03/13/2013   GLUCOSE 218* 03/17/2013   CHOL 117 07/03/2012   TRIG 183.0* 07/03/2012   HDL 29.60* 07/03/2012   LDLDIRECT 50.1 06/06/2011   LDLCALC 51 07/03/2012   ALT 10 03/12/2013   AST 14 03/12/2013   NA 141 03/17/2013   K 3.6 03/17/2013   CL 106 03/17/2013   CREATININE 2.33* 03/17/2013   BUN 40* 03/17/2013   CO2 23 03/17/2013   TSH 1.964 06/08/2012   PSA 1.64 07/03/2012   INR 1.12 03/12/2013   HGBA1C 7.1* 03/13/2013   Echo Study Conclusions from September 2014  - Left ventricle: The cavity size was normal. Wall thickness was normal. The estimated ejection fraction was 55%. Although no diagnostic regional wall motion abnormality was identified, this possibility cannot be completely excluded on the basis of this study. Doppler parameters are consistent with abnormal left ventricular relaxation (grade 1 diastolic dysfunction). - Aortic valve: There was no stenosis. - Mitral valve: Trivial regurgitation. - Right ventricle: Poorly visualized. Grossly normal size and probably normal systolic function. - Tricuspid valve: Peak RV-RA gradient: 30mm Hg (S). - Pulmonary arteries: PA peak pressure: 35mm Hg (S). - Inferior vena cava:  The vessel was normal in size; the respirophasic diameter changes were in the normal range (= 50%); findings are consistent with normal central venous pressure.    Assessment / Plan: 1. CAD - past CABG - no chest pain reported - favor conservative management.   2. Altered mental status - chronic subdural hematoma - followed by neuro during last admission - no follow up noted - looks like some degree of dementia as well is present.   3. HTN - with recent orthostasis - has had lots of medicines changed/stopped - BP ok at this  time on current regimen. No changes made today.   Will see him back in about 4 months.   Patient's daughter is agreeable to this plan and will call if any problems develop in the interim.   Rosalio Macadamia, RN, ANP-C [facility] [address] [place], [place]  [address]

## 2013-04-02 NOTE — Telephone Encounter (Signed)
T/O given to Free. Also given for OT to evaluate and treat

## 2013-04-02 NOTE — Patient Instructions (Addendum)
Continue with your current medicines per the list given to you today  See Dr. Patty Sermons in 4 months  Call the [facility] office at [phone] if you have any questions, problems or concerns.

## 2013-04-02 NOTE — Telephone Encounter (Signed)
New problem    Need a verbal order for 1x  weeks  For 1 weeks  2 x  week for 4 week . Home health physical therapy.

## 2013-04-04 NOTE — Telephone Encounter (Signed)
agree

## 2013-04-05 ENCOUNTER — Telehealth: Payer: Self-pay | Admitting: Pulmonary Disease

## 2013-04-05 ENCOUNTER — Encounter: Payer: Self-pay | Admitting: Internal Medicine

## 2013-04-05 ENCOUNTER — Ambulatory Visit (INDEPENDENT_AMBULATORY_CARE_PROVIDER_SITE_OTHER): Payer: Medicare PPO | Admitting: Internal Medicine

## 2013-04-05 VITALS — BP 150/88 | HR 67 | Temp 98.9°F | Ht 74.0 in | Wt 174.2 lb

## 2013-04-05 DIAGNOSIS — E119 Type 2 diabetes mellitus without complications: Secondary | ICD-10-CM

## 2013-04-05 DIAGNOSIS — Z794 Long term (current) use of insulin: Secondary | ICD-10-CM

## 2013-04-05 DIAGNOSIS — I6200 Nontraumatic subdural hemorrhage, unspecified: Secondary | ICD-10-CM

## 2013-04-05 DIAGNOSIS — S065X9A Traumatic subdural hemorrhage with loss of consciousness of unspecified duration, initial encounter: Secondary | ICD-10-CM

## 2013-04-05 DIAGNOSIS — I10 Essential (primary) hypertension: Secondary | ICD-10-CM

## 2013-04-05 NOTE — Telephone Encounter (Signed)
I made [name] aware. Nothing further needed

## 2013-04-05 NOTE — Patient Instructions (Signed)

## 2013-04-05 NOTE — Assessment & Plan Note (Signed)
Continue to monitor mental status RTC if you notice increase in confusion

## 2013-04-05 NOTE — Progress Notes (Signed)
Subjective:    Patient ID: [name], male    DOB: 1927/08/09, 79 y.o.   MRN: [id number]  HPI  Pt presents to the clinic today for SNF follow up. He was admitted to the hospital on after sustaining a fall in which he hit his head. CT showed acute on chronic right subdural hematoma. He had been c/o sore throat for a few days prior to his falls. Per report from the family, they had not seen him eating or drinking anything for close to 3 days prior to the fall. He was dehydrated upon arrival to the emergency department. After discharge from the hospital he was sent to the SNF. He stayed there for close to 1 week. Since leaving the SNF, he has been doing well. Mentally, he is back to his baseline. His strength is almost back to normal. He is working with PT/OT in home. He has had a good appetite. He has not sustained any falls since leaving the SNF.  Review of Systems      Past Medical History  Diagnosis Date  . Coronary artery disease     post CABG in 1997 with abnormal nuclear stress test in 2007 which did demonstrate mild reversible ischemia and has been managed medically.   . Diabetes mellitus     type 2  . Hypertension   . Hyperlipidemia   . Hypercholesterolemia   . Atypical chest pain   . Hypernatremia   . History of hypoglycemia   . Syncope   . CVA (cerebral infarction)   . Allergy   . History of chicken pox   . Acute on chronic renal insufficiency   . CKD (chronic kidney disease)   . GERD (gastroesophageal reflux disease)   . H/O hiatal hernia     Current Outpatient Prescriptions  Medication Sig Dispense Refill  . glipiZIDE (GLUCOTROL) 10 MG tablet Take 1 tablet (10 mg total) by mouth 2 (two) times daily before a meal.  60 tablet  3  . isosorbide mononitrate (IMDUR) 30 MG 24 hr tablet Take 60 mg by mouth daily.      . metoprolol (LOPRESSOR) 50 MG tablet Take 50 mg by mouth 2 (two) times daily.      Marland Kitchen omeprazole (PRILOSEC) 20 MG capsule Take 20 mg by mouth daily.       No  current facility-administered medications for this visit.    No Known Allergies  Family History  Problem Relation Age of Onset  . Hypertension Other     Parent  . Diabetes Other     Parent  . Hyperlipidemia Other     Parent    History   Social History  . Marital Status: Married    Spouse Name: N/A    Number of Children: N/A  . Years of Education: 13   Occupational History  . Retired    Social History Main Topics  . Smoking status: Never Smoker   . Smokeless tobacco: Never Used  . Alcohol Use: No  . Drug Use: No  . Sexual Activity: Not Currently   Other Topics Concern  . Not on file   Social History Narrative   Regular exercise-no   Caffeine Use-     Constitutional: Pt reports fatigue. Denies fever, malaise, headache or abrupt weight changes.  Respiratory: Denies difficulty breathing, shortness of breath, cough or sputum production.   Cardiovascular: Denies chest pain, chest tightness, palpitations or swelling in the hands or feet.  Musculoskeletal: Denies decrease in range of motion,  difficulty with gait, muscle pain or joint pain and swelling.  Neurological: Pt does reports some difficulty with memory and balance. Denies dizziness, difficulty with speech or problems with coordination.   No other specific complaints in a complete review of systems (except as listed in HPI above).  Objective:   Physical Exam BP 150/88  Pulse 67  Temp(Src) 98.9 F (37.2 C) (Oral)  Ht 6\' 2"  (1.88 m)  Wt 174 lb 4 oz (79.039 kg)  BMI 22.36 kg/m2  SpO2 98% Wt Readings from Last 3 Encounters:  04/05/13 174 lb 4 oz (79.039 kg)  04/02/13 173 lb 12.8 oz (78.835 kg)  03/30/13 170 lb (77.111 kg)    General: Appears his stated age, chronically ill appearing in NAD. Cardiovascular: Normal rate and rhythm. S1,S2 noted.  No murmur, rubs or gallops noted. No JVD or BLE edema. No carotid bruits noted. Pulmonary/Chest: Normal effort and positive vesicular breath sounds. No respiratory  distress. No wheezes, rales or ronchi noted.  Musculoskeletal: Normal range of motion. No signs of joint swelling. Mild difficulty with gait. Neurological: Alert and oriented. Cranial nerves II-XII intact. Coordination normal. +DTRs bilaterally.   BMET    Component Value Date/Time   NA 141 03/17/2013 0453   K 3.6 03/17/2013 0453   CL 106 03/17/2013 0453   CO2 23 03/17/2013 0453   GLUCOSE 218* 03/17/2013 0453   BUN 40* 03/17/2013 0453   CREATININE 2.33* 03/17/2013 0453   CREATININE 2.08* 06/08/2012 1538   CALCIUM 9.1 03/17/2013 0453   GFRNONAA 24* 03/17/2013 0453   GFRAA 28* 03/17/2013 0453    Lipid Panel     Component Value Date/Time   CHOL 117 07/03/2012 1547   TRIG 183.0* 07/03/2012 1547   HDL 29.60* 07/03/2012 1547   CHOLHDL 4 07/03/2012 1547   VLDL 36.6 07/03/2012 1547   LDLCALC 51 07/03/2012 1547    CBC    Component Value Date/Time   WBC 8.9 03/13/2013 0650   WBC 7.6 06/17/2012 1401   RBC 4.15* 03/13/2013 0650   RBC 3.82* 06/17/2012 1401   HGB 12.8* 03/13/2013 0650   HGB 11.0* 06/17/2012 1401   HCT 37.6* 03/13/2013 0650   HCT 36.1* 06/17/2012 1401   PLT 200 03/13/2013 0650   MCV 90.6 03/13/2013 0650   MCV 94.4 06/17/2012 1401   MCH 30.8 03/13/2013 0650   MCH 28.8 06/17/2012 1401   MCHC 34.0 03/13/2013 0650   MCHC 30.5* 06/17/2012 1401   RDW 12.9 03/13/2013 0650   LYMPHSABS 0.7 03/11/2013 1637   MONOABS 0.2 03/11/2013 1637   EOSABS 0.0 03/11/2013 1637   BASOSABS 0.0 03/11/2013 1637    Hgb A1C Lab Results  Component Value Date   HGBA1C 7.1* 03/13/2013        Assessment & Plan:   Hospital follow up s/p fall and subdural hematoma:  Seems to be doing well Continue to work with PT/OT Make sure you are drinking plenty of fluids to avoid dehydration Labs reviewed- back to baseline  RTC as needed or if you notice increase in confusion, falls or poor po intake

## 2013-04-05 NOTE — Telephone Encounter (Signed)
lmomtcb x1 for gani--- KC has never saw this pt in office. Looked in chart and do not see this was ordered by him.

## 2013-04-05 NOTE — Assessment & Plan Note (Signed)
Slightly elevated today Will continue to monitor If constantly stays > 150 will add back ARB

## 2013-04-05 NOTE — Assessment & Plan Note (Signed)
Taken off insuln and put on glipizide  Will recheck A1C in 3 months

## 2013-04-06 ENCOUNTER — Ambulatory Visit: Payer: Medicare PPO | Admitting: Internal Medicine

## 2013-04-12 ENCOUNTER — Other Ambulatory Visit: Payer: Self-pay | Admitting: Cardiology

## 2013-04-12 ENCOUNTER — Other Ambulatory Visit: Payer: Self-pay | Admitting: Internal Medicine

## 2013-04-27 ENCOUNTER — Telehealth: Payer: Self-pay | Admitting: *Deleted

## 2013-04-27 NOTE — Telephone Encounter (Signed)
Correct number [phone]

## 2013-04-27 NOTE — Telephone Encounter (Signed)
Okay for patient to have evaluation by a medical social worker

## 2013-04-27 NOTE — Telephone Encounter (Signed)
Advised [name]

## 2013-04-27 NOTE — Telephone Encounter (Signed)
Mellody Memos from [facility] @ [phone]  is discharging pt met his maximum goals and also wanted to notify about verbal order for  Power attorney for an evaluation for a medical social worker number wants to call back. Will forward to  Dr. Patty Sermons for review

## 2013-06-07 ENCOUNTER — Ambulatory Visit (INDEPENDENT_AMBULATORY_CARE_PROVIDER_SITE_OTHER): Payer: Medicare PPO | Admitting: Internal Medicine

## 2013-06-07 ENCOUNTER — Encounter: Payer: Self-pay | Admitting: Internal Medicine

## 2013-06-07 VITALS — BP 140/66 | HR 60 | Temp 97.4°F | Ht 74.0 in | Wt 175.5 lb

## 2013-06-07 DIAGNOSIS — Z4802 Encounter for removal of sutures: Secondary | ICD-10-CM

## 2013-06-07 NOTE — Patient Instructions (Signed)
Suture Removal °Your caregiver has removed your sutures today. If skin adhesive strips were applied at the time of suturing, or applied following removal of the sutures today, they will begin to peel off in a couple more days. If skin adhesive strips remain after 14 days, they may be removed. °HOME CARE INSTRUCTIONS  °· Change any bandages (dressings) at least once a day or as directed by your caregiver. If the bandage sticks, soak it off with warm, soapy water. °· Wash the area with soap and water to remove all the cream or ointment (if you were instructed to use any) 2 times a day. Rinse off the soap and pat the area dry with a clean towel. °· Reapply cream or ointment as directed by your caregiver. This will help prevent infection and keep the bandage from sticking. °· Keep the wound area dry and clean. If the bandage becomes wet, dirty, or develops a bad smell, change it as soon as possible. °· Only take over-the-counter or prescription medicines for pain, discomfort, or fever as directed by your caregiver. °· Use sunscreen when out in the sun. New scars become sunburned easily. °· Return to your caregivers office in in 7 days or as directed to have your sutures removed. °You may need a tetanus shot if: °· You cannot remember when you had your last tetanus shot. °· You have never had a tetanus shot. °· The injury broke your skin. °If you got a tetanus shot, your arm may swell, get red, and feel warm to the touch. This is common and not a problem. If you need a tetanus shot and you choose not to have one, there is a rare chance of getting tetanus. Sickness from tetanus can be serious. °SEEK IMMEDIATE MEDICAL CARE IF:  °· There is redness, swelling, or increasing pain in the wound. °· Pus is coming from the wound. °· An unexplained oral temperature above 102° F (38.9° C) develops. °· You notice a bad smell coming from the wound or dressing. °· The wound breaks open (edges not staying together) after sutures have  been removed. °Document Released: 03/12/2001 Document Revised: 09/09/2011 Document Reviewed: 01/27/2013 °ExitCare® Patient Information ©2014 [employer]. ° °

## 2013-06-07 NOTE — Progress Notes (Signed)
Pre-visit discussion using our clinic review tool. No additional management support is needed unless otherwise documented below in the visit note.  

## 2013-06-07 NOTE — Progress Notes (Signed)
Subjective:    Patient ID: [name], male    DOB: 10-27-1927, 79 y.o.   MRN: [id number]  HPI  Pt presents to the clinic today to have the stitches removed over his left eye. These were placed on 9+-/30/14 at [facility] after sustaining a fall. He has been picking at them. He has successfully removed 1 stitch but there are 2 left. He would like them removed today.   Review of Systems      Past Medical History  Diagnosis Date  . Coronary artery disease     post CABG in 1997 with abnormal nuclear stress test in 2007 which did demonstrate mild reversible ischemia and has been managed medically.   . Diabetes mellitus     type 2  . Hypertension   . Hyperlipidemia   . Hypercholesterolemia   . Atypical chest pain   . Hypernatremia   . History of hypoglycemia   . Syncope   . CVA (cerebral infarction)   . Allergy   . History of chicken pox   . Acute on chronic renal insufficiency   . CKD (chronic kidney disease)   . GERD (gastroesophageal reflux disease)   . H/O hiatal hernia     Current Outpatient Prescriptions  Medication Sig Dispense Refill  . glipiZIDE (GLUCOTROL) 10 MG tablet TAKE 1 TABLET BY MOUTH TWICE DAILY BEFORE A MEAL  60 tablet  0  . isosorbide mononitrate (IMDUR) 30 MG 24 hr tablet Take 60 mg by mouth daily.      . isosorbide mononitrate (IMDUR) 60 MG 24 hr tablet TAKE 1 TABLET BY MOUTH EVERY MORNING  90 tablet  0  . metoprolol (LOPRESSOR) 50 MG tablet Take 50 mg by mouth 2 (two) times daily.      Marland Kitchen omeprazole (PRILOSEC) 20 MG capsule Take 20 mg by mouth daily.       No current facility-administered medications for this visit.    No Known Allergies  Family History  Problem Relation Age of Onset  . Hypertension Other     Parent  . Diabetes Other     Parent  . Hyperlipidemia Other     Parent    History   Social History  . Marital Status: Married    Spouse Name: N/A    Number of Children: N/A  . Years of Education: 13   Occupational History  . Retired     Social History Main Topics  . Smoking status: Never Smoker   . Smokeless tobacco: Never Used  . Alcohol Use: No  . Drug Use: No  . Sexual Activity: Not Currently   Other Topics Concern  . Not on file   Social History Narrative   Regular exercise-no   Caffeine Use-     Constitutional: Denies fever, malaise, fatigue, headache or abrupt weight changes.  Skin: Denies redness, rashes, lesions or ulcercations.    No other specific complaints in a complete review of systems (except as listed in HPI above).  Objective:   Physical Exam   BP 140/66  Pulse 60  Temp(Src) 97.4 F (36.3 C) (Oral)  Ht 6\' 2"  (1.88 m)  Wt 175 lb 8 oz (79.606 kg)  BMI 22.52 kg/m2  SpO2 96% Wt Readings from Last 3 Encounters:  06/07/13 175 lb 8 oz (79.606 kg)  04/05/13 174 lb 4 oz (79.039 kg)  04/02/13 173 lb 12.8 oz (78.835 kg)    General: Appears his stated age, well developed, well nourished in NAD. Skin: Warm, dry and  intact. 2 stitches noted above left eye brow. Cardiovascular: Normal rate and rhythm. S1,S2 noted.  No murmur, rubs or gallops noted. No JVD or BLE edema. No carotid bruits noted. Pulmonary/Chest: Normal effort and positive vesicular breath sounds. No respiratory distress. No wheezes, rales or ronchi noted.    BMET    Component Value Date/Time   NA 141 03/17/2013 0453   K 3.6 03/17/2013 0453   CL 106 03/17/2013 0453   CO2 23 03/17/2013 0453   GLUCOSE 218* 03/17/2013 0453   BUN 40* 03/17/2013 0453   CREATININE 2.33* 03/17/2013 0453   CREATININE 2.08* 06/08/2012 1538   CALCIUM 9.1 03/17/2013 0453   GFRNONAA 24* 03/17/2013 0453   GFRAA 28* 03/17/2013 0453    Lipid Panel     Component Value Date/Time   CHOL 117 07/03/2012 1547   TRIG 183.0* 07/03/2012 1547   HDL 29.60* 07/03/2012 1547   CHOLHDL 4 07/03/2012 1547   VLDL 36.6 07/03/2012 1547   LDLCALC 51 07/03/2012 1547    CBC    Component Value Date/Time   WBC 8.9 03/13/2013 0650   WBC 7.6 06/17/2012 1401   RBC 4.15* 03/13/2013 0650    RBC 3.82* 06/17/2012 1401   HGB 12.8* 03/13/2013 0650   HGB 11.0* 06/17/2012 1401   HCT 37.6* 03/13/2013 0650   HCT 36.1* 06/17/2012 1401   PLT 200 03/13/2013 0650   MCV 90.6 03/13/2013 0650   MCV 94.4 06/17/2012 1401   MCH 30.8 03/13/2013 0650   MCH 28.8 06/17/2012 1401   MCHC 34.0 03/13/2013 0650   MCHC 30.5* 06/17/2012 1401   RDW 12.9 03/13/2013 0650   LYMPHSABS 0.7 03/11/2013 1637   MONOABS 0.2 03/11/2013 1637   EOSABS 0.0 03/11/2013 1637   BASOSABS 0.0 03/11/2013 1637    Hgb A1C Lab Results  Component Value Date   HGBA1C 7.1* 03/13/2013        Assessment & Plan:    Suture removal:  Use clean technique and suture removal kit- removed last 2 sutures above left eyebrow Cleansed with warm water and soap After care instructions given  RTC as needed

## 2013-06-14 ENCOUNTER — Other Ambulatory Visit: Payer: Self-pay | Admitting: Internal Medicine

## 2013-07-22 ENCOUNTER — Other Ambulatory Visit: Payer: Self-pay | Admitting: Cardiology

## 2013-07-22 ENCOUNTER — Other Ambulatory Visit: Payer: Self-pay

## 2013-07-22 MED ORDER — OMEPRAZOLE 20 MG PO CPDR: 20.0000 mg | DELAYED_RELEASE_CAPSULE | Freq: Every day | ORAL | Status: DC

## 2013-07-22 MED ORDER — ISOSORBIDE MONONITRATE ER 60 MG PO TB24: ORAL_TABLET | ORAL | Status: DC

## 2013-07-22 NOTE — Telephone Encounter (Signed)
Called for refill on isosorbide; not sure which mg, 30 and 60 mg is on pts med list. Caller will ck with pharmacy about refills and have pharmacy contact office with correct mg.

## 2013-08-25 ENCOUNTER — Other Ambulatory Visit: Payer: Self-pay

## 2013-08-25 MED ORDER — GLIPIZIDE 10 MG PO TABS: ORAL_TABLET | ORAL | Status: DC

## 2013-08-25 NOTE — Telephone Encounter (Signed)
Sharita pts granddaughter request refill glipizide to [facility] [address]. Jerrell Belfast said pt is going to continue seeing Webb Silversmith NP.Please advise.

## 2013-08-30 ENCOUNTER — Other Ambulatory Visit: Payer: Self-pay | Admitting: *Deleted

## 2013-08-30 MED ORDER — GLIPIZIDE 10 MG PO TABS: ORAL_TABLET | ORAL | Status: DC

## 2013-08-30 NOTE — Telephone Encounter (Signed)
Received faxed refill request from pharmacy. Refill sent to pharmacy electronically. 

## 2013-10-11 ENCOUNTER — Ambulatory Visit (INDEPENDENT_AMBULATORY_CARE_PROVIDER_SITE_OTHER): Payer: Medicare PPO | Admitting: Cardiology

## 2013-10-11 ENCOUNTER — Encounter: Payer: Self-pay | Admitting: Cardiology

## 2013-10-11 VITALS — BP 130/72 | HR 79 | Ht 74.0 in | Wt 164.0 lb

## 2013-10-11 DIAGNOSIS — I259 Chronic ischemic heart disease, unspecified: Secondary | ICD-10-CM

## 2013-10-11 DIAGNOSIS — E1165 Type 2 diabetes mellitus with hyperglycemia: Secondary | ICD-10-CM

## 2013-10-11 DIAGNOSIS — IMO0001 Reserved for inherently not codable concepts without codable children: Secondary | ICD-10-CM

## 2013-10-11 DIAGNOSIS — N189 Chronic kidney disease, unspecified: Secondary | ICD-10-CM

## 2013-10-11 DIAGNOSIS — R634 Abnormal weight loss: Secondary | ICD-10-CM

## 2013-10-11 DIAGNOSIS — I2510 Atherosclerotic heart disease of native coronary artery without angina pectoris: Secondary | ICD-10-CM

## 2013-10-11 DIAGNOSIS — E7800 Pure hypercholesterolemia, unspecified: Secondary | ICD-10-CM

## 2013-10-11 DIAGNOSIS — I119 Hypertensive heart disease without heart failure: Secondary | ICD-10-CM

## 2013-10-11 DIAGNOSIS — D649 Anemia, unspecified: Secondary | ICD-10-CM

## 2013-10-11 LAB — CBC WITH DIFFERENTIAL/PLATELET
BASOS ABS: 0 10*3/uL (ref 0.0–0.1)
BASOS PCT: 0.8 % (ref 0.0–3.0)
EOS ABS: 0.2 10*3/uL (ref 0.0–0.7)
Eosinophils Relative: 4.9 % (ref 0.0–5.0)
HCT: 33.9 % — ABNORMAL LOW (ref 39.0–52.0)
Hemoglobin: 11.2 g/dL — ABNORMAL LOW (ref 13.0–17.0)
LYMPHS PCT: 37.5 % (ref 12.0–46.0)
Lymphs Abs: 1.7 10*3/uL (ref 0.7–4.0)
MCHC: 33 g/dL (ref 30.0–36.0)
MCV: 93.5 fl (ref 78.0–100.0)
MONO ABS: 0.3 10*3/uL (ref 0.1–1.0)
Monocytes Relative: 5.7 % (ref 3.0–12.0)
Neutro Abs: 2.4 10*3/uL (ref 1.4–7.7)
Neutrophils Relative %: 51.1 % (ref 43.0–77.0)
PLATELETS: 214 10*3/uL (ref 150.0–400.0)
RBC: 3.63 Mil/uL — ABNORMAL LOW (ref 4.22–5.81)
RDW: 13.5 % (ref 11.5–14.6)
WBC: 4.6 10*3/uL (ref 4.5–10.5)

## 2013-10-11 LAB — LIPID PANEL
CHOL/HDL RATIO: 8
Cholesterol: 217 mg/dL — ABNORMAL HIGH (ref 0–200)
HDL: 26.6 mg/dL — ABNORMAL LOW (ref >39.00)
LDL CALC: 154 mg/dL — AB (ref 0–99)
Triglycerides: 184 mg/dL — ABNORMAL HIGH (ref 0.0–149.0)
VLDL: 36.8 mg/dL (ref 0.0–40.0)

## 2013-10-11 LAB — HEPATIC FUNCTION PANEL
ALK PHOS: 63 U/L (ref 39–117)
ALT: 8 U/L (ref 0–53)
AST: 12 U/L (ref 0–37)
Albumin: 3.6 g/dL (ref 3.5–5.2)
BILIRUBIN DIRECT: 0.1 mg/dL (ref 0.0–0.3)
BILIRUBIN TOTAL: 0.8 mg/dL (ref 0.3–1.2)
Total Protein: 7 g/dL (ref 6.0–8.3)

## 2013-10-11 LAB — BASIC METABOLIC PANEL
BUN: 30 mg/dL — ABNORMAL HIGH (ref 6–23)
CHLORIDE: 106 meq/L (ref 96–112)
CO2: 19 mEq/L (ref 19–32)
Calcium: 9.1 mg/dL (ref 8.4–10.5)
Creatinine, Ser: 2.4 mg/dL — ABNORMAL HIGH (ref 0.4–1.5)
GFR: 33.68 mL/min — AB (ref >60.00)
Glucose, Bld: 226 mg/dL — ABNORMAL HIGH (ref 70–99)
Potassium: 3.7 mEq/L (ref 3.5–5.1)
SODIUM: 136 meq/L (ref 135–145)

## 2013-10-11 MED ORDER — ISOSORBIDE MONONITRATE ER 30 MG PO TB24: ORAL_TABLET | ORAL | Status: DC

## 2013-10-11 NOTE — Assessment & Plan Note (Signed)
The patient has a history of chronic kidney disease.  There are no labs in Epic since September 2014.  We will update his CBC basal metabolic panel hepatic function panel and lipid panel today.

## 2013-10-11 NOTE — Progress Notes (Signed)
[name] Date of Birth:  02/06/1928 [address] [place], [place]  [address] [phone]         Fax   [phone]  History of Present Illness: This pleasant 80 year old African American gentleman is seen after a long absence. He has a history of multiple medical problems which include remote CABG in 1997 and a cardiac catheter in 2000 and a mildly abnormal nuclear stress test in 2007. He also has hypertension chronic kidney disease hyperlipidemia and diabetes mellitus. He was recently hospitalized at [facility] with syncope. He had a very thorough workup. It apparently was felt that his symptoms were secondary to hypoglycemia and he was taken off his glimepiride.  Recently he has been having some new problems.  He complains that his legs hurt and are weak.  He has had a poor appetite.  He has had an unintended weight loss of 25 pounds over the past year. He had a chest x-ray 06/08/12 which showed normal heart size and clear lungs.  He states he had a recent colonoscopy at [facility] long by Dr. Benson Norway which did not show any malignancy or anything to explain his weight loss.  He has been anemic and has not been taking his baby aspirin daily.  He and the family state that there is no history of any blood being found in his stool.  The patient has dementia and poor memory.  His wife and his children help look after him.  He has not been eating well.  He has had some dizziness when he stands up from a chair.   Current Outpatient Prescriptions  Medication Sig Dispense Refill  . ENSURE (ENSURE) Take 1 Can by mouth daily.      Marland Kitchen glipiZIDE (GLUCOTROL) 10 MG tablet TAKE 1 TABLET BY MOUTH TWICE DAILY BEFORE A MEAL  60 tablet  2  . isosorbide mononitrate (IMDUR) 30 MG 24 hr tablet TAKE 1 TABLET BY MOUTH EVERY MORNING  90 tablet  1  . metoprolol (LOPRESSOR) 50 MG tablet Take 50 mg by mouth 2 (two) times daily.      Marland Kitchen omeprazole (PRILOSEC) 20 MG capsule TAKE ONE CAPSULE BY MOUTH DAILY   90 capsule  0   No current facility-administered medications for this visit.    No Known Allergies  Patient Active Problem List   Diagnosis Date Noted  . SDH (subdural hematoma) 03/11/2013  . Anemia 08/21/2012  . HTN (hypertension) 07/04/2012  . CKD (chronic kidney disease) 12/05/2011  . Ischemic heart disease 02/04/2011  . Benign hypertensive heart disease without heart failure 02/04/2011  . Diabetes mellitus type 2, insulin dependent 02/04/2011  . Hypercholesterolemia 02/04/2011    History  Smoking status  . Never Smoker   Smokeless tobacco  . Never Used    History  Alcohol Use No    Family History  Problem Relation Age of Onset  . Hypertension Other     Parent  . Diabetes Other     Parent  . Hyperlipidemia Other     Parent    Review of Systems: Constitutional: no fever chills diaphoresis or fatigue or change in weight.  Head and neck: no hearing loss, no epistaxis, no photophobia or visual disturbance. Respiratory: No cough, shortness of breath or wheezing. Cardiovascular: No chest pain peripheral edema, palpitations. Gastrointestinal: No abdominal distention, no abdominal pain, no change in bowel habits hematochezia or melena. Genitourinary: No dysuria, no frequency, no urgency, no nocturia. Musculoskeletal:No arthralgias, no back pain, no gait  disturbance or myalgias. Neurological: No dizziness, no headaches, no numbness, no seizures, no syncope, no weakness, no tremors. Hematologic: No lymphadenopathy, no easy bruising. Psychiatric: No confusion, no hallucinations, no sleep disturbance.    Physical Exam: Filed Vitals:   10/11/13 1345  BP: 97/61  Pulse: 79   the general appearance reveals a well-developed well-nourished elderly gentleman who appears to be chronically ill.The head and neck exam reveals pupils equal and reactive.  Extraocular movements are full.  There is no scleral icterus.  The mouth and pharynx are normal.  The neck is supple.  The  carotids reveal no bruits.  The jugular venous pressure is normal.  The  thyroid is not enlarged.  There is no lymphadenopathy.  The chest is clear to percussion and auscultation.  There are no rales or rhonchi.  Expansion of the chest is symmetrical.  The precordium is quiet.  The first heart sound is normal.  The second heart sound is physiologically split.  There is no murmur gallop rub or click.  There is no abnormal lift or heave.  The abdomen is soft and nontender.  The bowel sounds are normal.  The liver and spleen are not enlarged.  There are no abdominal masses.  There are no abdominal bruits.  Extremities reveal good pedal pulses.  There is no phlebitis or edema.  There is no cyanosis or clubbing.  Strength is normal and symmetrical in all extremities.  There is no lateralizing weakness.  There are no sensory deficits.  The skin is warm and dry.  There is no rash.     Assessment / Plan: Await results of today's labs.  Restart baby aspirin 81 mg daily.  Recheck in 4 months for followup office visit CBC lipid panel basal metabolic panel and hepatic function panel.     [name] Date of Birth:  05-Aug-1927 [facility] [address] [place], [place]  [address] [phone]         Fax   [phone]  History of Present Illness: This pleasant 80 year old [place] American gentleman is seen for a scheduled followup office visit. He has a history of multiple medical problems which include remote CABG in 1997 and a cardiac catheter in 2000 and a mildly abnormal nuclear stress test in 2007. He also has hypertension chronic kidney disease hyperlipidemia and diabetes mellitus. He was recently hospitalized at [facility] with syncope. He had a very thorough workup. It apparently was felt that his symptoms were secondary to hypoglycemia and he was taken off his glimepiride.  Recently he has been having some new problems.  He complains that his legs hurt and are weak.  He  has had a poor appetite.  He has had an unintended weight loss of 16 pounds.  He had a chest x-ray 06/08/12 which showed normal heart size and clear lungs.  He states he had a recent colonoscopy at [facility] long by Dr. Benson Norway which did not show any malignancy or anything to explain his weight loss.  He has been anemic and has not been taking his baby aspirin daily.  He and the family state that there is no history of any blood being found in his stool.   Current Outpatient Prescriptions  Medication Sig Dispense Refill  . ENSURE (ENSURE) Take 1 Can by mouth daily.      Marland Kitchen glipiZIDE (GLUCOTROL) 10 MG tablet TAKE 1 TABLET BY MOUTH TWICE DAILY BEFORE A MEAL  60 tablet  2  . isosorbide mononitrate (  IMDUR) 30 MG 24 hr tablet TAKE 1 TABLET BY MOUTH EVERY MORNING  90 tablet  1  . metoprolol (LOPRESSOR) 50 MG tablet Take 50 mg by mouth 2 (two) times daily.      Marland Kitchen omeprazole (PRILOSEC) 20 MG capsule TAKE ONE CAPSULE BY MOUTH DAILY  90 capsule  0   No current facility-administered medications for this visit.    No Known Allergies  Patient Active Problem List   Diagnosis Date Noted  . SDH (subdural hematoma) 03/11/2013  . Anemia 08/21/2012  . HTN (hypertension) 07/04/2012  . CKD (chronic kidney disease) 12/05/2011  . Ischemic heart disease 02/04/2011  . Benign hypertensive heart disease without heart failure 02/04/2011  . Diabetes mellitus type 2, insulin dependent 02/04/2011  . Hypercholesterolemia 02/04/2011    History  Smoking status  . Never Smoker   Smokeless tobacco  . Never Used    History  Alcohol Use No    Family History  Problem Relation Age of Onset  . Hypertension Other     Parent  . Diabetes Other     Parent  . Hyperlipidemia Other     Parent    Review of Systems: Constitutional: no fever chills diaphoresis or fatigue or change in weight.  Head and neck: no hearing loss, no epistaxis, no photophobia or visual disturbance. Respiratory: No cough, shortness of breath or  wheezing. Cardiovascular: No chest pain peripheral edema, palpitations. Gastrointestinal: No abdominal distention, no abdominal pain, no change in bowel habits hematochezia or melena. Genitourinary: No dysuria, no frequency, no urgency, no nocturia. Musculoskeletal:No arthralgias, no back pain, no gait disturbance or myalgias. Neurological: No dizziness, no headaches, no numbness, no seizures, no syncope, no weakness, no tremors. Hematologic: No lymphadenopathy, no easy bruising. Psychiatric: No confusion, no hallucinations, no sleep disturbance.    Physical Exam: Filed Vitals:   10/11/13 1345  BP: 97/61  Pulse: 79   the general appearance reveals a well-developed well-nourished elderly gentleman who appears to be chronically ill.The head and neck exam reveals pupils equal and reactive.  Extraocular movements are full.  There is no scleral icterus.  The mouth and pharynx are normal.  The neck is supple.  The carotids reveal no bruits.  The jugular venous pressure is normal.  The  thyroid is not enlarged.  There is no lymphadenopathy.  The chest is clear to percussion and auscultation.  There are no rales or rhonchi.  Expansion of the chest is symmetrical.  The precordium is quiet.  The first heart sound is normal.  The second heart sound is physiologically split.  There is no murmur gallop rub or click.  There is no abnormal lift or heave.  The abdomen is soft and nontender.  The bowel sounds are normal.  The liver and spleen are not enlarged.  There are no abdominal masses.  There are no abdominal bruits.  Extremities reveal good pedal pulses.  There is no phlebitis or edema.  There is no cyanosis or clubbing.  Strength is normal and symmetrical in all extremities.  There is no lateralizing weakness.  There are no sensory deficits.  The skin is warm and dry.  There is no rash.     Assessment / Plan: Await results of today's labs.  Decreased isosorbide mononitrate 30 mg daily to allow a higher  blood pressure. The family will give him some and sure since he is not eating regular food much.  I have encouraged him to try not to lose any more weight.  His dementia appears to be playing a significant role now. Recheck here in 4 months for followup office visit and EKG

## 2013-10-11 NOTE — Patient Instructions (Signed)
Will obtain labs today and call you with the results (CBC/BMET/HFP/LP)  DECREASE YOUR IMDUR (ISOSORBIDE) TO 30 MG IN THE MORNING  START DRINKING 1 CAN OF ENSURE A DAY  Your physician wants you to follow-up in: [place] will receive a reminder letter in the mail two months in advance. If you don't receive a letter, please call our office to schedule the follow-up appointment.

## 2013-10-11 NOTE — Assessment & Plan Note (Signed)
The patient does not recall whether he has had any recent chest pain or not.  The family is not aware of any recent chest pain.

## 2013-10-11 NOTE — Assessment & Plan Note (Signed)
He has had some orthostatic dizziness when he stands up quickly.  When he first arrived today his systolic pressure was 97.  I checked his blood pressure myself later in the exam and it was 130/72.  I suspect that he is having some drop in blood pressure contributing to some of his symptoms and we will cut back on his isosorbide mononitrate to just 30 mg each day.

## 2013-10-11 NOTE — Assessment & Plan Note (Signed)
The patient appears pale.  We will check a CBC.  He may have anemia of chronic disease related to his chronic kidney disease

## 2013-10-12 NOTE — Progress Notes (Signed)
Quick Note:  Please report to patient. The recent labs are stable. Continue same medication and careful diet. Please report to caregiver. There is mild anemia, not much different than last time, consistent with chronic disease. The renal function is about the same. The liver tests are normal. The blood sugar is 226. The LDL cholesterol is too high for patient with diabetes and coronary artery disease. Add a statin in the form of Lipitor 20 mg daily. Recheck lipid panel, hepatic function panel, and basal metabolic panel in about 2 months after starting Lipitor. ______

## 2013-10-15 ENCOUNTER — Telehealth: Payer: Self-pay | Admitting: Cardiology

## 2013-10-15 DIAGNOSIS — D649 Anemia, unspecified: Secondary | ICD-10-CM

## 2013-10-15 DIAGNOSIS — E7800 Pure hypercholesterolemia, unspecified: Secondary | ICD-10-CM

## 2013-10-15 MED ORDER — ATORVASTATIN CALCIUM 20 MG PO TABS: 20.0000 mg | ORAL_TABLET | Freq: Every day | ORAL | Status: DC

## 2013-10-15 NOTE — Telephone Encounter (Signed)
Message copied by Earvin Hansen on Fri Oct 15, 2013  7:02 PM ------      Message from: Darlin Coco      Created: Tue Oct 12, 2013  7:51 AM       Please report to patient.  The recent labs are stable. Continue same medication and careful diet.  Please report to caregiver.  There is mild anemia, not much different than last time, consistent with chronic disease.  The renal function is about the same.  The liver tests are normal.  The blood sugar is 226. The LDL cholesterol is too high for patient with diabetes and coronary artery disease.  Add a statin in the form of Lipitor 20 mg daily.  Recheck lipid panel, hepatic function panel, and basal metabolic panel in about 2 months after starting Lipitor. ------

## 2013-10-15 NOTE — Telephone Encounter (Signed)
Advised Reggie, son of results and medication change

## 2013-10-15 NOTE — Telephone Encounter (Signed)
New message  ° ° °Daughter calling for test results.   °

## 2013-10-27 ENCOUNTER — Other Ambulatory Visit: Payer: Self-pay

## 2013-10-27 MED ORDER — METOPROLOL TARTRATE 50 MG PO TABS: 50.0000 mg | ORAL_TABLET | Freq: Two times a day (BID) | ORAL | Status: DC

## 2013-10-27 NOTE — Telephone Encounter (Signed)
Last OV with you was 12/14 but pt has recently seen Cardiology 10/11/13--this medication is in historical and I did not see where you have ever prescribed this medication

## 2013-12-06 ENCOUNTER — Other Ambulatory Visit: Payer: Self-pay | Admitting: Cardiology

## 2013-12-15 ENCOUNTER — Other Ambulatory Visit (INDEPENDENT_AMBULATORY_CARE_PROVIDER_SITE_OTHER): Payer: Medicare PPO

## 2013-12-15 DIAGNOSIS — D649 Anemia, unspecified: Secondary | ICD-10-CM

## 2013-12-15 DIAGNOSIS — E7800 Pure hypercholesterolemia, unspecified: Secondary | ICD-10-CM

## 2013-12-15 LAB — CBC WITH DIFFERENTIAL/PLATELET
Basophils Absolute: 0 K/uL (ref 0.0–0.1)
Basophils Relative: 0.4 % (ref 0.0–3.0)
Eosinophils Absolute: 0.3 K/uL (ref 0.0–0.7)
Eosinophils Relative: 4.6 % (ref 0.0–5.0)
HCT: 37.3 % — ABNORMAL LOW (ref 39.0–52.0)
Hemoglobin: 12.2 g/dL — ABNORMAL LOW (ref 13.0–17.0)
Lymphocytes Relative: 47.9 % — ABNORMAL HIGH (ref 12.0–46.0)
Lymphs Abs: 2.9 K/uL (ref 0.7–4.0)
MCHC: 32.8 g/dL (ref 30.0–36.0)
MCV: 93.9 fl (ref 78.0–100.0)
Monocytes Absolute: 0.3 K/uL (ref 0.1–1.0)
Monocytes Relative: 5.4 % (ref 3.0–12.0)
Neutro Abs: 2.5 K/uL (ref 1.4–7.7)
Neutrophils Relative %: 41.7 % — ABNORMAL LOW (ref 43.0–77.0)
Platelets: 216 K/uL (ref 150.0–400.0)
RBC: 3.97 Mil/uL — ABNORMAL LOW (ref 4.22–5.81)
RDW: 14.3 % (ref 11.5–15.5)
WBC: 6 K/uL (ref 4.0–10.5)

## 2013-12-15 LAB — LIPID PANEL
Cholesterol: 219 mg/dL — ABNORMAL HIGH (ref 0–200)
HDL: 29 mg/dL — ABNORMAL LOW (ref >39.00)
LDL Cholesterol: 147 mg/dL — ABNORMAL HIGH (ref 0–99)
NonHDL: 190
Total CHOL/HDL Ratio: 8
Triglycerides: 216 mg/dL — ABNORMAL HIGH (ref 0.0–149.0)
VLDL: 43.2 mg/dL — ABNORMAL HIGH (ref 0.0–40.0)

## 2013-12-15 LAB — HEPATIC FUNCTION PANEL
ALT: 9 U/L (ref 0–53)
AST: 15 U/L (ref 0–37)
Albumin: 4.4 g/dL (ref 3.5–5.2)
Alkaline Phosphatase: 66 U/L (ref 39–117)
Bilirubin, Direct: 0.1 mg/dL (ref 0.0–0.3)
Total Bilirubin: 0.5 mg/dL (ref 0.2–1.2)
Total Protein: 7.9 g/dL (ref 6.0–8.3)

## 2013-12-15 LAB — BASIC METABOLIC PANEL WITH GFR
BUN: 28 mg/dL — ABNORMAL HIGH (ref 6–23)
CO2: 20 meq/L (ref 19–32)
Calcium: 9.7 mg/dL (ref 8.4–10.5)
Chloride: 112 meq/L (ref 96–112)
Creatinine, Ser: 2.6 mg/dL — ABNORMAL HIGH (ref 0.4–1.5)
GFR: 30.13 mL/min — ABNORMAL LOW (ref >60.00)
Glucose, Bld: 119 mg/dL — ABNORMAL HIGH (ref 70–99)
Potassium: 4.3 meq/L (ref 3.5–5.1)
Sodium: 143 meq/L (ref 135–145)

## 2013-12-15 NOTE — Progress Notes (Signed)
Quick Note:  Please report to patient. The recent labs are stable. Continue same medication and careful diet. ______ 

## 2014-02-08 ENCOUNTER — Ambulatory Visit (INDEPENDENT_AMBULATORY_CARE_PROVIDER_SITE_OTHER): Payer: Medicare PPO | Admitting: Cardiology

## 2014-02-08 ENCOUNTER — Encounter: Payer: Self-pay | Admitting: Cardiology

## 2014-02-08 VITALS — BP 136/79 | HR 51 | Ht 73.0 in | Wt 170.0 lb

## 2014-02-08 DIAGNOSIS — S065XAA Traumatic subdural hemorrhage with loss of consciousness status unknown, initial encounter: Secondary | ICD-10-CM

## 2014-02-08 DIAGNOSIS — E1165 Type 2 diabetes mellitus with hyperglycemia: Secondary | ICD-10-CM

## 2014-02-08 DIAGNOSIS — I119 Hypertensive heart disease without heart failure: Secondary | ICD-10-CM

## 2014-02-08 DIAGNOSIS — IMO0001 Reserved for inherently not codable concepts without codable children: Secondary | ICD-10-CM

## 2014-02-08 DIAGNOSIS — D649 Anemia, unspecified: Secondary | ICD-10-CM

## 2014-02-08 DIAGNOSIS — E7800 Pure hypercholesterolemia, unspecified: Secondary | ICD-10-CM

## 2014-02-08 DIAGNOSIS — I6200 Nontraumatic subdural hemorrhage, unspecified: Secondary | ICD-10-CM

## 2014-02-08 DIAGNOSIS — S065X9A Traumatic subdural hemorrhage with loss of consciousness of unspecified duration, initial encounter: Secondary | ICD-10-CM

## 2014-02-08 DIAGNOSIS — I259 Chronic ischemic heart disease, unspecified: Secondary | ICD-10-CM

## 2014-02-08 MED ORDER — ATORVASTATIN CALCIUM 40 MG PO TABS: 40.0000 mg | ORAL_TABLET | Freq: Every day | ORAL | Status: DC

## 2014-02-08 NOTE — Assessment & Plan Note (Signed)
Blood pressure has been stable at home.

## 2014-02-08 NOTE — Patient Instructions (Signed)
Your physician has recommended you make the following change in your medication:   1. Increase Lipitor to 40 mg daily.   Your physician recommends that you return for a FASTING lipid, heaptic and bmet in 4 months.   Your physician recommends that you schedule a follow-up appointment in: 4 months with Dr. Mare Ferrari.

## 2014-02-08 NOTE — Assessment & Plan Note (Signed)
The patient has not been any exertional chest pain or angina pectoris.

## 2014-02-08 NOTE — Assessment & Plan Note (Signed)
His LDL cholesterol is up to 147 on the dose of Lipitor 20 mg daily.  His family states that he occasionally misses a day.  We will increase his Lipitor up to 40 mg daily.

## 2014-02-08 NOTE — Assessment & Plan Note (Signed)
The patient has poor memory.  He has a history of falls.  He is no longer on aspirin

## 2014-02-08 NOTE — Progress Notes (Signed)
[name] Date of Birth:  09-17-1927 [facility] [address] [place], [place]  [address] [phone]        Fax   [phone]   History of Present Illness: This pleasant 80 year old African American gentleman is seen for a four-month followup office visit.Marland Kitchen He has a history of multiple medical problems which include remote CABG in 1997 and a cardiac catheter in 2000 and a mildly abnormal nuclear stress test in 2007. He also has hypertension chronic kidney disease hyperlipidemia and diabetes mellitus. He was recently hospitalized at [facility] with syncope. He had a very thorough workup. It apparently was felt that his symptoms were secondary to hypoglycemia and he was taken off his glimepiride. Recently he has been having some new problems. He complains that his legs hurt and are weak. He has had a poor appetite. He has had an unintended weight loss of 25 pounds over the past year.  However now he has regained 6 pounds. He had a chest x-ray 06/08/12 which showed normal heart size and clear lungs. He states he had a recent colonoscopy at [facility] long by Dr. Benson Norway which did not show any malignancy or anything to explain his weight loss. He has been anemic.  He is no longer taking aspirin because of being a fall risk.  He had a subdural hematoma. . The patient has dementia and poor memory. His wife and his children help look after him. He has not been eating well.  He is drinking some protein supplements. He has had some dizziness when he stands up from a chair.   Current Outpatient Prescriptions  Medication Sig Dispense Refill  . atorvastatin (LIPITOR) 40 MG tablet Take 1 tablet (40 mg total) by mouth daily.  30 tablet  5  . ENSURE (ENSURE) Take 1 Can by mouth daily.      Marland Kitchen glipiZIDE (GLUCOTROL) 10 MG tablet TAKE 1 TABLET BY MOUTH TWICE DAILY BEFORE A MEAL  60 tablet  2  . isosorbide mononitrate (IMDUR) 30 MG 24 hr tablet TAKE 1 TABLET BY MOUTH EVERY MORNING  90  tablet  1  . metoprolol (LOPRESSOR) 50 MG tablet Take 1 tablet (50 mg total) by mouth 2 (two) times daily.  60 tablet  2  . omeprazole (PRILOSEC) 20 MG capsule TAKE ONE CAPSULE BY MOUTH ONCE DAILY  90 capsule  0   No current facility-administered medications for this visit.    No Known Allergies  Patient Active Problem List   Diagnosis Date Noted  . SDH (subdural hematoma) 03/11/2013  . Anemia 08/21/2012  . HTN (hypertension) 07/04/2012  . CKD (chronic kidney disease) 12/05/2011  . Ischemic heart disease 02/04/2011  . Benign hypertensive heart disease without heart failure 02/04/2011  . Diabetes mellitus type 2, insulin dependent 02/04/2011  . Hypercholesterolemia 02/04/2011    History  Smoking status  . Never Smoker   Smokeless tobacco  . Never Used    History  Alcohol Use No    Family History  Problem Relation Age of Onset  . Hypertension Other     Parent  . Diabetes Other     Parent  . Hyperlipidemia Other     Parent    Review of Systems: Constitutional: no fever chills diaphoresis or fatigue or change in weight.  Head and neck: no hearing loss, no epistaxis, no photophobia or visual disturbance. Respiratory: No cough, shortness of breath or wheezing. Cardiovascular: No chest pain peripheral edema, palpitations. Gastrointestinal: No  abdominal distention, no abdominal pain, no change in bowel habits hematochezia or melena. Genitourinary: No dysuria, no frequency, no urgency, no nocturia. Musculoskeletal:No arthralgias, no back pain, no gait disturbance or myalgias. Neurological: No dizziness, no headaches, no numbness, no seizures, no syncope, no weakness, no tremors. Hematologic: No lymphadenopathy, no easy bruising. Psychiatric: No confusion, no hallucinations, no sleep disturbance.    Physical Exam: Filed Vitals:   02/08/14 1355  BP: 136/79  Pulse: 51   EKG shows sinus bradycardia with sinus arrhythmia and occasional PVCs.  No ischemic  changes.  Assessment / Plan: 1. ischemic heart disease status post CABG in 1997.  He had a mildly abnormal nuclear stress test in 2007. 2. chronic kidney disease stage III 3. Hyperlipidemia 4. Dementia 5. history of subdural hematoma secondary to fall 6. diabetes mellitus 7. chronic anemia without evidence of GI blood loss  Plan continue current medication.  Increase Lipitor to 40 mg daily.  Recheck in 4 months for office visit CBC lipid panel hepatic function panel and basal metabolic panel.

## 2014-04-01 ENCOUNTER — Encounter (HOSPITAL_COMMUNITY): Payer: Self-pay | Admitting: Emergency Medicine

## 2014-04-01 ENCOUNTER — Emergency Department (HOSPITAL_COMMUNITY): Payer: Medicare PPO

## 2014-04-01 ENCOUNTER — Inpatient Hospital Stay (HOSPITAL_COMMUNITY)
Admission: EM | Admit: 2014-04-01 | Discharge: 2014-04-05 | DRG: 065 | Disposition: A | Discharge status: Skilled Nursing Facility | Payer: Medicare PPO | Attending: Internal Medicine | Admitting: Internal Medicine

## 2014-04-01 DIAGNOSIS — I129 Hypertensive chronic kidney disease with stage 1 through stage 4 chronic kidney disease, or unspecified chronic kidney disease: Secondary | ICD-10-CM | POA: Diagnosis present

## 2014-04-01 DIAGNOSIS — E780 Pure hypercholesterolemia: Secondary | ICD-10-CM | POA: Diagnosis present

## 2014-04-01 DIAGNOSIS — Z833 Family history of diabetes mellitus: Secondary | ICD-10-CM

## 2014-04-01 DIAGNOSIS — Z66 Do not resuscitate: Secondary | ICD-10-CM | POA: Diagnosis present

## 2014-04-01 DIAGNOSIS — R1310 Dysphagia, unspecified: Secondary | ICD-10-CM | POA: Diagnosis present

## 2014-04-01 DIAGNOSIS — N390 Urinary tract infection, site not specified: Secondary | ICD-10-CM | POA: Diagnosis present

## 2014-04-01 DIAGNOSIS — I259 Chronic ischemic heart disease, unspecified: Secondary | ICD-10-CM | POA: Diagnosis present

## 2014-04-01 DIAGNOSIS — Z7982 Long term (current) use of aspirin: Secondary | ICD-10-CM | POA: Diagnosis not present

## 2014-04-01 DIAGNOSIS — Z8249 Family history of ischemic heart disease and other diseases of the circulatory system: Secondary | ICD-10-CM | POA: Diagnosis not present

## 2014-04-01 DIAGNOSIS — R4182 Altered mental status, unspecified: Secondary | ICD-10-CM

## 2014-04-01 DIAGNOSIS — E785 Hyperlipidemia, unspecified: Secondary | ICD-10-CM | POA: Diagnosis present

## 2014-04-01 DIAGNOSIS — S065X9A Traumatic subdural hemorrhage with loss of consciousness of unspecified duration, initial encounter: Secondary | ICD-10-CM

## 2014-04-01 DIAGNOSIS — I6200 Nontraumatic subdural hemorrhage, unspecified: Secondary | ICD-10-CM

## 2014-04-01 DIAGNOSIS — Z23 Encounter for immunization: Secondary | ICD-10-CM

## 2014-04-01 DIAGNOSIS — I63031 Cerebral infarction due to thrombosis of right carotid artery: Secondary | ICD-10-CM | POA: Diagnosis present

## 2014-04-01 DIAGNOSIS — Z794 Long term (current) use of insulin: Secondary | ICD-10-CM | POA: Diagnosis not present

## 2014-04-01 DIAGNOSIS — R001 Bradycardia, unspecified: Secondary | ICD-10-CM | POA: Diagnosis present

## 2014-04-01 DIAGNOSIS — Z681 Body mass index (BMI) 19 or less, adult: Secondary | ICD-10-CM | POA: Diagnosis not present

## 2014-04-01 DIAGNOSIS — N189 Chronic kidney disease, unspecified: Secondary | ICD-10-CM | POA: Diagnosis present

## 2014-04-01 DIAGNOSIS — R7989 Other specified abnormal findings of blood chemistry: Secondary | ICD-10-CM

## 2014-04-01 DIAGNOSIS — I10 Essential (primary) hypertension: Secondary | ICD-10-CM

## 2014-04-01 DIAGNOSIS — I2510 Atherosclerotic heart disease of native coronary artery without angina pectoris: Secondary | ICD-10-CM | POA: Diagnosis present

## 2014-04-01 DIAGNOSIS — E119 Type 2 diabetes mellitus without complications: Secondary | ICD-10-CM

## 2014-04-01 DIAGNOSIS — G8194 Hemiplegia, unspecified affecting left nondominant side: Secondary | ICD-10-CM | POA: Diagnosis present

## 2014-04-01 DIAGNOSIS — S065XAA Traumatic subdural hemorrhage with loss of consciousness status unknown, initial encounter: Secondary | ICD-10-CM

## 2014-04-01 DIAGNOSIS — Z8673 Personal history of transient ischemic attack (TIA), and cerebral infarction without residual deficits: Secondary | ICD-10-CM

## 2014-04-01 DIAGNOSIS — R531 Weakness: Secondary | ICD-10-CM

## 2014-04-01 DIAGNOSIS — N183 Chronic kidney disease, stage 3 (moderate): Secondary | ICD-10-CM | POA: Diagnosis present

## 2014-04-01 DIAGNOSIS — I6529 Occlusion and stenosis of unspecified carotid artery: Secondary | ICD-10-CM | POA: Diagnosis present

## 2014-04-01 DIAGNOSIS — R64 Cachexia: Secondary | ICD-10-CM | POA: Diagnosis present

## 2014-04-01 DIAGNOSIS — R778 Other specified abnormalities of plasma proteins: Secondary | ICD-10-CM | POA: Diagnosis present

## 2014-04-01 DIAGNOSIS — I248 Other forms of acute ischemic heart disease: Secondary | ICD-10-CM | POA: Diagnosis present

## 2014-04-01 DIAGNOSIS — F0390 Unspecified dementia without behavioral disturbance: Secondary | ICD-10-CM | POA: Diagnosis present

## 2014-04-01 DIAGNOSIS — Z951 Presence of aortocoronary bypass graft: Secondary | ICD-10-CM | POA: Diagnosis not present

## 2014-04-01 DIAGNOSIS — I119 Hypertensive heart disease without heart failure: Secondary | ICD-10-CM

## 2014-04-01 DIAGNOSIS — I6330 Cerebral infarction due to thrombosis of unspecified cerebral artery: Secondary | ICD-10-CM

## 2014-04-01 DIAGNOSIS — M6289 Other specified disorders of muscle: Secondary | ICD-10-CM

## 2014-04-01 LAB — URINALYSIS, ROUTINE W REFLEX MICROSCOPIC
BILIRUBIN URINE: NEGATIVE
Glucose, UA: NEGATIVE mg/dL
KETONES UR: NEGATIVE mg/dL
NITRITE: NEGATIVE
PROTEIN: 100 mg/dL — AB
SPECIFIC GRAVITY, URINE: 1.01 (ref 1.005–1.030)
UROBILINOGEN UA: 0.2 mg/dL (ref 0.0–1.0)
pH: 5 (ref 5.0–8.0)

## 2014-04-01 LAB — I-STAT TROPONIN, ED: Troponin i, poc: 0.52 ng/mL (ref 0.00–0.08)

## 2014-04-01 LAB — I-STAT CHEM 8, ED
BUN: 31 mg/dL — ABNORMAL HIGH (ref 6–23)
CHLORIDE: 110 meq/L (ref 96–112)
CREATININE: 2.5 mg/dL — AB (ref 0.50–1.35)
Calcium, Ion: 1.18 mmol/L (ref 1.13–1.30)
GLUCOSE: 174 mg/dL — AB (ref 70–99)
HCT: 38 % — ABNORMAL LOW (ref 39.0–52.0)
HEMOGLOBIN: 12.9 g/dL — AB (ref 13.0–17.0)
Potassium: 4.4 mEq/L (ref 3.7–5.3)
SODIUM: 144 meq/L (ref 137–147)
TCO2: 22 mmol/L (ref 0–100)

## 2014-04-01 LAB — CBC
HCT: 33.3 % — ABNORMAL LOW (ref 39.0–52.0)
Hemoglobin: 11.3 g/dL — ABNORMAL LOW (ref 13.0–17.0)
MCH: 31.2 pg (ref 26.0–34.0)
MCHC: 33.9 g/dL (ref 30.0–36.0)
MCV: 92 fL (ref 78.0–100.0)
Platelets: 257 10*3/uL (ref 150–400)
RBC: 3.62 MIL/uL — ABNORMAL LOW (ref 4.22–5.81)
RDW: 12.8 % (ref 11.5–15.5)
WBC: 7.1 10*3/uL (ref 4.0–10.5)

## 2014-04-01 LAB — COMPREHENSIVE METABOLIC PANEL
ALBUMIN: 3.6 g/dL (ref 3.5–5.2)
ALT: 6 U/L (ref 0–53)
AST: 15 U/L (ref 0–37)
Alkaline Phosphatase: 61 U/L (ref 39–117)
Anion gap: 15 (ref 5–15)
BUN: 31 mg/dL — ABNORMAL HIGH (ref 6–23)
CALCIUM: 9.1 mg/dL (ref 8.4–10.5)
CO2: 22 mEq/L (ref 19–32)
CREATININE: 2.38 mg/dL — AB (ref 0.50–1.35)
Chloride: 107 mEq/L (ref 96–112)
GFR calc Af Amer: 27 mL/min — ABNORMAL LOW (ref >90)
GFR calc non Af Amer: 23 mL/min — ABNORMAL LOW (ref >90)
Glucose, Bld: 171 mg/dL — ABNORMAL HIGH (ref 70–99)
Potassium: 4.7 mEq/L (ref 3.7–5.3)
Sodium: 144 mEq/L (ref 137–147)
TOTAL PROTEIN: 8.1 g/dL (ref 6.0–8.3)
Total Bilirubin: 0.4 mg/dL (ref 0.3–1.2)

## 2014-04-01 LAB — DIFFERENTIAL
BASOS ABS: 0 10*3/uL (ref 0.0–0.1)
BASOS PCT: 0 % (ref 0–1)
EOS PCT: 0 % (ref 0–5)
Eosinophils Absolute: 0 10*3/uL (ref 0.0–0.7)
Lymphocytes Relative: 13 % (ref 12–46)
Lymphs Abs: 0.9 10*3/uL (ref 0.7–4.0)
MONO ABS: 0.5 10*3/uL (ref 0.1–1.0)
Monocytes Relative: 6 % (ref 3–12)
Neutro Abs: 5.8 10*3/uL (ref 1.7–7.7)
Neutrophils Relative %: 81 % — ABNORMAL HIGH (ref 43–77)

## 2014-04-01 LAB — URINE MICROSCOPIC-ADD ON

## 2014-04-01 LAB — RAPID URINE DRUG SCREEN, HOSP PERFORMED
AMPHETAMINES: NOT DETECTED
BARBITURATES: NOT DETECTED
BENZODIAZEPINES: NOT DETECTED
COCAINE: NOT DETECTED
OPIATES: NOT DETECTED
Tetrahydrocannabinol: NOT DETECTED

## 2014-04-01 LAB — CBG MONITORING, ED: Glucose-Capillary: 154 mg/dL — ABNORMAL HIGH (ref 70–99)

## 2014-04-01 LAB — APTT: APTT: 29 s (ref 24–37)

## 2014-04-01 LAB — TROPONIN I
TROPONIN I: 0.82 ng/mL — AB (ref <0.30)
TROPONIN I: 1.56 ng/mL — AB (ref <0.30)

## 2014-04-01 LAB — GLUCOSE, CAPILLARY: GLUCOSE-CAPILLARY: 196 mg/dL — AB (ref 70–99)

## 2014-04-01 LAB — PROTIME-INR
INR: 1.11 (ref 0.00–1.49)
PROTHROMBIN TIME: 14.3 s (ref 11.6–15.2)

## 2014-04-01 LAB — ETHANOL: Alcohol, Ethyl (B): <11 mg/dL (ref 0–11)

## 2014-04-01 MED ORDER — METOPROLOL TARTRATE 50 MG PO TABS
50.0000 mg | ORAL_TABLET | Freq: Two times a day (BID) | ORAL | Status: DC
  Administered 2014-04-02: 50 mg via ORAL
  Filled 2014-04-01 (×5): qty 1

## 2014-04-01 MED ORDER — ASPIRIN 325 MG PO TABS
325.0000 mg | ORAL_TABLET | Freq: Every day | ORAL | Status: DC
  Administered 2014-04-02: 325 mg via ORAL
  Filled 2014-04-01 (×3): qty 1

## 2014-04-01 MED ORDER — ASPIRIN 325 MG PO TABS: 325.0000 mg | ORAL_TABLET | Freq: Once | ORAL | Status: DC

## 2014-04-01 MED ORDER — INSULIN ASPART 100 UNIT/ML ~~LOC~~ SOLN
0.0000 [IU] | Freq: Three times a day (TID) | SUBCUTANEOUS | Status: DC
  Administered 2014-04-03: 1 [IU] via SUBCUTANEOUS
  Administered 2014-04-04 – 2014-04-05 (×3): 2 [IU] via SUBCUTANEOUS
  Administered 2014-04-05: 1 [IU] via SUBCUTANEOUS

## 2014-04-01 MED ORDER — HEPARIN SODIUM (PORCINE) 5000 UNIT/ML IJ SOLN
5000.0000 [IU] | Freq: Three times a day (TID) | INTRAMUSCULAR | Status: DC
  Administered 2014-04-01 – 2014-04-02 (×2): 5000 [IU] via SUBCUTANEOUS
  Filled 2014-04-01 (×5): qty 1

## 2014-04-01 MED ORDER — ISOSORBIDE MONONITRATE ER 30 MG PO TB24
30.0000 mg | ORAL_TABLET | Freq: Every day | ORAL | Status: DC
  Administered 2014-04-03 – 2014-04-05 (×3): 30 mg via ORAL
  Filled 2014-04-01 (×4): qty 1

## 2014-04-01 MED ORDER — STROKE: EARLY STAGES OF RECOVERY BOOK
Freq: Once | Status: AC
  Administered 2014-04-02: 07:00:00
  Filled 2014-04-01 (×2): qty 1

## 2014-04-01 MED ORDER — PANTOPRAZOLE SODIUM 40 MG PO TBEC
40.0000 mg | DELAYED_RELEASE_TABLET | Freq: Every day | ORAL | Status: DC
  Administered 2014-04-03 – 2014-04-05 (×3): 40 mg via ORAL
  Filled 2014-04-01 (×5): qty 1

## 2014-04-01 MED ORDER — DEXTROSE 5 % IV SOLN
1.0000 g | INTRAVENOUS | Status: DC
  Administered 2014-04-01 – 2014-04-04 (×4): 1 g via INTRAVENOUS
  Filled 2014-04-01 (×6): qty 10

## 2014-04-01 MED ORDER — ASPIRIN 300 MG RE SUPP
300.0000 mg | Freq: Every day | RECTAL | Status: DC
  Administered 2014-04-01 – 2014-04-03 (×2): 300 mg via RECTAL
  Filled 2014-04-01 (×3): qty 1

## 2014-04-01 NOTE — ED Notes (Signed)
Urine sent

## 2014-04-01 NOTE — H&P (Addendum)
[facility] History and Physical  [name] WUJ:[id number] DOB: [birth year] DOA: 04/01/2014  Referring physician: EDP PCP: Webb Silversmith, NP   Chief Complaint: Left-sided weakness  HPI: [name] is a 80 y.o. male with past medical history of dementia, ischemic heart disease and history of SDH. Patient brought to the hospital because of left-sided weakness. There was no family members at bedside, patient is demented and confused, so the H&P obtained from the EDP notes. Apparently patient was in his urine of health until about 7:30 PM when he started to have left-sided weakness, his son helped him to get back to the bed, this is continues this morning so they brought him to the hospital for further evaluation. Patient is confused so no much of complaints. In the ED he was found to have troponin of 0.82, urinalysis is consistent with UTI. Patient admitted to the hospital for further evaluation.   Review of Systems:  Unobtainable because of dementia  Past Medical History  Diagnosis Date  . Coronary artery disease     post CABG in 1997 with abnormal nuclear stress test in 2007 which did demonstrate mild reversible ischemia and has been managed medically.   . Diabetes mellitus     type 2  . Hypertension   . Hyperlipidemia   . Hypercholesterolemia   . Atypical chest pain   . Hypernatremia   . History of hypoglycemia   . Syncope   . CVA (cerebral infarction)   . Allergy   . History of chicken pox   . Acute on chronic renal insufficiency   . CKD (chronic kidney disease)   . GERD (gastroesophageal reflux disease)   . H/O hiatal hernia    Past Surgical History  Procedure Laterality Date  . Coronary artery bypass graft  1997    x3 -- patent internal mammary graft to saphenous vein grafts x3  . Tonsillectomy    . Cardiac catheterization  01/26/1999    patent internal mammary graft to saphenous vein grafts x3 -- singnificant three-vessel coroanry artery disease -- potential  sites of ischemia involving the intermediate branch, circumflex branch and the distal LAD through diffuse disease, as well as steal type syndrome -- normal LV function -- W. Doristine Church., M.D.    . Esophagogastroduodenoscopy  07/22/2012    Procedure: ESOPHAGOGASTRODUODENOSCOPY (EGD);  Surgeon: Beryle Beams, MD;  Location: [facility];  Service: Endoscopy;  Laterality: N/A;  . Colonoscopy  07/22/2012    Procedure: COLONOSCOPY;  Surgeon: Beryle Beams, MD;  Location: [facility];  Service: Endoscopy;  Laterality: N/A;   Social History:   reports that he has never smoked. He has never used smokeless tobacco. He reports that he does not drink alcohol or use illicit drugs.  No Known Allergies  Family History  Problem Relation Age of Onset  . Hypertension Other     Parent  . Diabetes Other     Parent  . Hyperlipidemia Other     Parent     Prior to Admission medications   Medication Sig Start Date End Date Taking? Authorizing Provider  ENSURE (ENSURE) Take 1 Can by mouth daily.   Yes Historical Provider, MD  glipiZIDE (GLUCOTROL) 10 MG tablet Take 10 mg by mouth 2 (two) times daily. 08/30/13  Yes Jearld Fenton, NP  isosorbide mononitrate (IMDUR) 30 MG 24 hr tablet Take 30 mg by mouth daily. 10/11/13  Yes Darlin Coco, MD  metoprolol (LOPRESSOR) 50 MG tablet Take 50 mg by mouth 2 (two)  times daily. 10/27/13  Yes Jearld Fenton, NP  omeprazole (PRILOSEC) 20 MG capsule Take 20 mg by mouth daily.   Yes Historical Provider, MD   Physical Exam: Filed Vitals:   04/01/14 1600  BP: 164/84  Pulse: 72  Temp:   Resp: 17   Constitutional: Confused, appears comfortable Head: Normocephalic and atraumatic.  Nose: Nose normal.  Mouth/Throat: Uvula is midline, oropharynx is clear and moist and mucous membranes are normal.  Eyes: Conjunctivae and EOM are normal. Pupils are equal, round, and reactive to light.  Neck: Trachea normal and normal range of motion. Neck supple.  Cardiovascular:  Normal rate, regular rhythm, S1 normal, S2 normal, normal heart sounds and intact distal pulses.   Pulmonary/Chest: Effort normal and breath sounds normal.  Abdominal: Soft. Bowel sounds are normal. There is no hepatosplenomegaly. There is no tenderness.  Musculoskeletal: Normal range of motion.  Neurological: Follows simple commands, has left-sided neglect to some extent, unable to follow commands regarding his left upper extremity  Skin: Skin is warm, dry and intact.   Labs on Admission:  Basic Metabolic Panel:  Recent Labs Lab 04/01/14 1541 04/01/14 1554  NA 144 144  K 4.7 4.4  CL 107 110  CO2 22  --   GLUCOSE 171* 174*  BUN 31* 31*  CREATININE 2.38* 2.50*  CALCIUM 9.1  --    Liver Function Tests:  Recent Labs Lab 04/01/14 1541  AST 15  ALT 6  ALKPHOS 61  BILITOT 0.4  PROT 8.1  ALBUMIN 3.6   No results found for this basename: LIPASE, AMYLASE,  in the last 168 hours No results found for this basename: AMMONIA,  in the last 168 hours CBC:  Recent Labs Lab 04/01/14 1541 04/01/14 1554  WBC 7.1  --   NEUTROABS 5.8  --   HGB 11.3* 12.9*  HCT 33.3* 38.0*  MCV 92.0  --   PLT 257  --    Cardiac Enzymes:  Recent Labs Lab 04/01/14 1540  TROPONINI 0.82*    BNP (last 3 results) No results found for this basename: PROBNP,  in the last 8760 hours CBG:  Recent Labs Lab 04/01/14 1351  GLUCAP 154*    Radiological Exams on Admission: Ct Head Wo Contrast  04/01/2014   CLINICAL DATA:  80 year old male with left-sided weakness and pain.  EXAM: CT HEAD WITHOUT CONTRAST  TECHNIQUE: Contiguous axial images were obtained from the base of the skull through the vertex without intravenous contrast.  COMPARISON:  Head CT 03/30/2013, 03/11/2013.  MRI 03/15/2013  FINDINGS: Unremarkable appearance of the calvarium with no acute fracture or aggressive lesion.  No paranasal sinus disease.  Mastoid air cells are clear.  Unremarkable appearance of the bilateral orbits.  Re-  demonstration of heterogeneous high right parietal subdural collection without associated mass effect. This is essentially unchanged in greatest thickness and distribution as compared to prior CT and MRI.  Extra-axial collection overlying the left frontal region measures slightly greater as compared to the prior CT, previously 16 mm, and now 19 mm. Persistent mild mass effect on the underlying brain. Unchanged appearance of right frontal CSF density collection.  No region of new or acute intracranial hemorrhage.  New region of hypodensity in the left temporoparietal region extending to the Smethers matter.  Re- demonstration of encephalomalacic changes of the right temporoparietal region.  Unchanged configuration of the ventricular system.  Similar appearance of periventricular white matter changes, particularly overlying the left frontal horn, as well as the right hemisphere.  Dense intracranial atherosclerotic calcifications.  IMPRESSION: No new or acute hemorrhage. There is similar appearance of right parietal subdural collection (present on study of 1 year prior) without associated mass effect.  The left frontal hygroma measures slightly greater than on the comparison CT, previously 16 mm and now 19 mm. Configuration of the underlying brain is similar to prior. Also, similar appearance of right frontal hygroma.  New region of encephalomalacia of left temporal parietal brain, which appears chronic/remote. If there was concern for acute component, MR may be indicated.  Intracranial atherosclerosis.  Signed,  Dulcy Fanny. Earleen Newport, DO  [facility]  [facility]   Electronically Signed   By: Corrie Mckusick D.O.   On: 04/01/2014 15:10    EKG: Independently reviewed.   Assessment/Plan Principal Problem:   Acute left-sided weakness Active Problems:   Ischemic heart disease   Diabetes mellitus type 2, insulin dependent   CKD (chronic kidney disease)   HTN (hypertension)    SDH (subdural hematoma)   UTI (lower urinary tract infection)   Elevated troponin    Acute left-sided weakness -Patient admitted to the hospital for further evaluation.  -CT scan done in the ED and showed no evidence of acute events. -MRI will be done to rule out acute events. -N.p.o. till RN swallow screen.  UTI -Patient started empirically on Rocephin, urinalysis consistent with UTI. -Adjust antibiotics according to the culture results.  Elevated troponin -Patient has troponin of 0.82, unclear with the patient has chest pain as he is demented does not have clear complaints. -Patient is on beta blockers, continued started on aspirin. -Patient has old subdural hematoma, no change since last year, I think aspirin would be safe. -Cycle 3 sets of cardiac enzymes and repeat EKG, discussed with Dr. Haroldine Laws of cardiology. -Likely no intervention as this probably might be secondary to his CVA or elevated creatinine, cycle enzymes.  Diabetes mellitus -Patient is on glipizide, will be n.p.o. -Started on insulin sliding scale.  CKD stage III -Creatinine is 2.38, this is around his baseline. Monitor while in the hospital.  SDH -Has chronic right-sided SDH, no mass effect, no changes since about a year ago. -No active bleeding, aspirin started.  Code Status: Full code Family Communication:  Disposition Plan: Telemetry  Time spent: 70 minutes  [facility] Pager [phone]

## 2014-04-01 NOTE — ED Notes (Signed)
Attempted lab draw, unsuccessful. Notified phlebotomy

## 2014-04-01 NOTE — ED Notes (Signed)
Per EMS, pt comes from home with family c/o left side weakness and pain. Pt confused, alert to self. Pt not able to follow commands. Pt last seen normal at 2130. Pt has h/o HTM, DM, and CBG. VSS: BP 164/98, P88, R14.

## 2014-04-01 NOTE — ED Notes (Signed)
Phlebotomy at bedside.

## 2014-04-01 NOTE — ED Notes (Signed)
Lab results for i stat Troponin reported to Dr. Darl Householder.

## 2014-04-01 NOTE — ED Notes (Signed)
Patient transported to CT 

## 2014-04-01 NOTE — ED Notes (Signed)
The floor called for report unable to give report  At this time will call back

## 2014-04-01 NOTE — ED Notes (Signed)
In and out cath attempted and was not successful

## 2014-04-01 NOTE — ED Provider Notes (Signed)
History of Present Illness   Patient Identification [name] is a 80 y.o. male.  Patient information was obtained from patient, relative(s), EMS personnel and past medical records. History/Exam limitations: mental status, confusion and dementia. Patient presented to the Emergency Department by ambulance w  Chief Complaint  Stroke Symptoms   Patient presents for evaluation of weakness of left arm(s) or left lower leg(s). Onset of symptoms was gradual, starting about 15 hours ago. Symptoms are currently of moderate severity. Symptoms have lasted 15 hours. Symptoms occur constantly since onset. Stroke risk factors include diabetes mellitus, hyperlipidemia, hypertension and previous CVA.  Patient called out to family this morning after an episode of incontinence.  He was unable to use the L arm or L leg and had confusion beyond his baseline. No recent fevers, chills, infection, or history of UTIs. Level 5 caveat due to AMS.  Past Medical History  Diagnosis Date  . Coronary artery disease     post CABG in 1997 with abnormal nuclear stress test in 2007 which did demonstrate mild reversible ischemia and has been managed medically.   . Diabetes mellitus     type 2  . Hypertension   . Hyperlipidemia   . Hypercholesterolemia   . Atypical chest pain   . Hypernatremia   . History of hypoglycemia   . Syncope   . CVA (cerebral infarction)   . Allergy   . History of chicken pox   . Acute on chronic renal insufficiency   . CKD (chronic kidney disease)   . GERD (gastroesophageal reflux disease)   . H/O hiatal hernia    Family History  Problem Relation Age of Onset  . Hypertension Other     Parent  . Diabetes Other     Parent  . Hyperlipidemia Other     Parent   No current facility-administered medications for this encounter.   Current Outpatient Prescriptions  Medication Sig Dispense Refill  . atorvastatin (LIPITOR) 40 MG tablet Take 1 tablet (40 mg total) by mouth daily.  30  tablet  5  . ENSURE (ENSURE) Take 1 Can by mouth daily.      Marland Kitchen glipiZIDE (GLUCOTROL) 10 MG tablet TAKE 1 TABLET BY MOUTH TWICE DAILY BEFORE A MEAL  60 tablet  2  . isosorbide mononitrate (IMDUR) 30 MG 24 hr tablet TAKE 1 TABLET BY MOUTH EVERY MORNING  90 tablet  1  . metoprolol (LOPRESSOR) 50 MG tablet Take 1 tablet (50 mg total) by mouth 2 (two) times daily.  60 tablet  2  . omeprazole (PRILOSEC) 20 MG capsule TAKE ONE CAPSULE BY MOUTH ONCE DAILY  90 capsule  0   No Known Allergies History   Social History  . Marital Status: Married    Spouse Name: N/A    Number of Children: N/A  . Years of Education: 13   Occupational History  . Retired    Social History Main Topics  . Smoking status: Never Smoker   . Smokeless tobacco: Never Used  . Alcohol Use: No  . Drug Use: No  . Sexual Activity: Not Currently   Other Topics Concern  . Not on file   Social History Narrative   Regular exercise-no   Caffeine Use-   Review of Systems unable to review due to AMS  Per daughter + for weakness, urinary incontinence, confusion Physical Exam   BP 156/90  Pulse 80  Temp(Src) 97.7 F (36.5 C) (Oral)  Resp 18  SpO2 100% BP 156/90  Pulse  80  Temp(Src) 97.7 F (36.5 C) (Oral)  Resp 18  SpO2 99%  General Appearance:    Alert, cooperative, no distress, appears stated age  Head:    Normocephalic, without obvious abnormality, atraumatic  Eyes:    PERRL, conjunctiva/corneas clear, EOM's intact, fundi    benign, both eyes  Ears:    Normal TM's and external ear canals, both ears  Nose:   Nares normal, septum midline, mucosa normal, no drainage    or sinus tenderness  Throat:   Lips, mucosa, and tongue normal; teeth and gums normal  Neck:   Supple, symmetrical, trachea midline, no adenopathy;    thyroid:  no enlargement/tenderness/nodules; no carotid   bruit or JVD  Back:     Symmetric, no curvature, ROM normal, no CVA tenderness  Lungs:     Clear to auscultation bilaterally,  respirations unlabored  Chest Wall:    No tenderness or deformity   Heart:    Regular rate and rhythm, S1 and S2 normal, no murmur, rub   or gallop  Breast Exam:    No tenderness, masses, or nipple abnormality  Abdomen:     Soft, non-tender, bowel sounds active all four quadrants,    no masses, no organomegaly  Genitalia:    Normal male without lesion, discharge or tenderness  Rectal:    Normal tone, normal prostate, no masses or tenderness;   guaiac negative stool  Extremities:   Extremities normal, atraumatic, no cyanosis or edema  Pulses:   2+ and symmetric all extremities  Skin:   Skin color, texture, turgor normal, no rashes or lesions  Lymph nodes:   Cervical, supraclavicular, and axillary nodes normal  Neurologic:   Patient disoriented. Name only. Unable to follow commands. Appears to have left sided neglect and will not move the L arm or leg. Patient is normally ambulatory.L arm appears spastic and tremulous.    ED Course   Studies: Results for orders placed during the hospital encounter of 04/01/14  ETHANOL      Result Value Ref Range   Alcohol, Ethyl (B) <11  0 - 11 mg/dL  PROTIME-INR      Result Value Ref Range   Prothrombin Time 14.3  11.6 - 15.2 seconds   INR 1.11  0.00 - 1.49  APTT      Result Value Ref Range   aPTT 29  24 - 37 seconds  CBC      Result Value Ref Range   WBC 7.1  4.0 - 10.5 K/uL   RBC 3.62 (*) 4.22 - 5.81 MIL/uL   Hemoglobin 11.3 (*) 13.0 - 17.0 g/dL   HCT 33.3 (*) 39.0 - 52.0 %   MCV 92.0  78.0 - 100.0 fL   MCH 31.2  26.0 - 34.0 pg   MCHC 33.9  30.0 - 36.0 g/dL   RDW 12.8  11.5 - 15.5 %   Platelets 257  150 - 400 K/uL  DIFFERENTIAL      Result Value Ref Range   Neutrophils Relative % 81 (*) 43 - 77 %   Neutro Abs 5.8  1.7 - 7.7 K/uL   Lymphocytes Relative 13  12 - 46 %   Lymphs Abs 0.9  0.7 - 4.0 K/uL   Monocytes Relative 6  3 - 12 %   Monocytes Absolute 0.5  0.1 - 1.0 K/uL   Eosinophils Relative 0  0 - 5 %   Eosinophils Absolute 0.0   0.0 - 0.7 K/uL   Basophils  Relative 0  0 - 1 %   Basophils Absolute 0.0  0.0 - 0.1 K/uL  COMPREHENSIVE METABOLIC PANEL      Result Value Ref Range   Sodium 144  137 - 147 mEq/L   Potassium 4.7  3.7 - 5.3 mEq/L   Chloride 107  96 - 112 mEq/L   CO2 22  19 - 32 mEq/L   Glucose, Bld 171 (*) 70 - 99 mg/dL   BUN 31 (*) 6 - 23 mg/dL   Creatinine, Ser 2.38 (*) 0.50 - 1.35 mg/dL   Calcium 9.1  8.4 - 10.5 mg/dL   Total Protein 8.1  6.0 - 8.3 g/dL   Albumin 3.6  3.5 - 5.2 g/dL   AST 15  0 - 37 U/L   ALT 6  0 - 53 U/L   Alkaline Phosphatase 61  39 - 117 U/L   Total Bilirubin 0.4  0.3 - 1.2 mg/dL   GFR calc non Af Amer 23 (*) >90 mL/min   GFR calc Af Amer 27 (*) >90 mL/min   Anion gap 15  5 - 15  I-STAT CHEM 8, ED      Result Value Ref Range   Sodium 144  137 - 147 mEq/L   Potassium 4.4  3.7 - 5.3 mEq/L   Chloride 110  96 - 112 mEq/L   BUN 31 (*) 6 - 23 mg/dL   Creatinine, Ser 2.50 (*) 0.50 - 1.35 mg/dL   Glucose, Bld 174 (*) 70 - 99 mg/dL   Calcium, Ion 1.18  1.13 - 1.30 mmol/L   TCO2 22  0 - 100 mmol/L   Hemoglobin 12.9 (*) 13.0 - 17.0 g/dL   HCT 38.0 (*) 39.0 - 52.0 %  I-STAT TROPOININ, ED      Result Value Ref Range   Troponin i, poc 0.52 (*) 0.00 - 0.08 ng/mL   Comment NOTIFIED PHYSICIAN     Comment 3           CBG MONITORING, ED      Result Value Ref Range   Glucose-Capillary 154 (*) 70 - 99 mg/dL    Imaging: CT scan: Head: abnormal new with a left frontal hygroma and greater than previous CT, new region of encephalomalacia of left termpero parietal region. MRI pending Results for orders placed during the hospital encounter of 04/01/14  ETHANOL      Result Value Ref Range   Alcohol, Ethyl (B) <11  0 - 11 mg/dL  PROTIME-INR      Result Value Ref Range   Prothrombin Time 14.3  11.6 - 15.2 seconds   INR 1.11  0.00 - 1.49  APTT      Result Value Ref Range   aPTT 29  24 - 37 seconds  CBC      Result Value Ref Range   WBC 7.1  4.0 - 10.5 K/uL   RBC 3.62 (*) 4.22 - 5.81  MIL/uL   Hemoglobin 11.3 (*) 13.0 - 17.0 g/dL   HCT 33.3 (*) 39.0 - 52.0 %   MCV 92.0  78.0 - 100.0 fL   MCH 31.2  26.0 - 34.0 pg   MCHC 33.9  30.0 - 36.0 g/dL   RDW 12.8  11.5 - 15.5 %   Platelets 257  150 - 400 K/uL  DIFFERENTIAL      Result Value Ref Range   Neutrophils Relative % 81 (*) 43 - 77 %   Neutro Abs 5.8  1.7 - 7.7 K/uL  Lymphocytes Relative 13  12 - 46 %   Lymphs Abs 0.9  0.7 - 4.0 K/uL   Monocytes Relative 6  3 - 12 %   Monocytes Absolute 0.5  0.1 - 1.0 K/uL   Eosinophils Relative 0  0 - 5 %   Eosinophils Absolute 0.0  0.0 - 0.7 K/uL   Basophils Relative 0  0 - 1 %   Basophils Absolute 0.0  0.0 - 0.1 K/uL  COMPREHENSIVE METABOLIC PANEL      Result Value Ref Range   Sodium 144  137 - 147 mEq/L   Potassium 4.7  3.7 - 5.3 mEq/L   Chloride 107  96 - 112 mEq/L   CO2 22  19 - 32 mEq/L   Glucose, Bld 171 (*) 70 - 99 mg/dL   BUN 31 (*) 6 - 23 mg/dL   Creatinine, Ser 2.38 (*) 0.50 - 1.35 mg/dL   Calcium 9.1  8.4 - 10.5 mg/dL   Total Protein 8.1  6.0 - 8.3 g/dL   Albumin 3.6  3.5 - 5.2 g/dL   AST 15  0 - 37 U/L   ALT 6  0 - 53 U/L   Alkaline Phosphatase 61  39 - 117 U/L   Total Bilirubin 0.4  0.3 - 1.2 mg/dL   GFR calc non Af Amer 23 (*) >90 mL/min   GFR calc Af Amer 27 (*) >90 mL/min   Anion gap 15  5 - 15  URINE RAPID DRUG SCREEN (HOSP PERFORMED)      Result Value Ref Range   Opiates NONE DETECTED  NONE DETECTED   Cocaine NONE DETECTED  NONE DETECTED   Benzodiazepines NONE DETECTED  NONE DETECTED   Amphetamines NONE DETECTED  NONE DETECTED   Tetrahydrocannabinol NONE DETECTED  NONE DETECTED   Barbiturates NONE DETECTED  NONE DETECTED  URINALYSIS, ROUTINE W REFLEX MICROSCOPIC      Result Value Ref Range   Color, Urine YELLOW  YELLOW   APPearance CLOUDY (*) CLEAR   Specific Gravity, Urine 1.010  1.005 - 1.030   pH 5.0  5.0 - 8.0   Glucose, UA NEGATIVE  NEGATIVE mg/dL   Hgb urine dipstick LARGE (*) NEGATIVE   Bilirubin Urine NEGATIVE  NEGATIVE   Ketones,  ur NEGATIVE  NEGATIVE mg/dL   Protein, ur 100 (*) NEGATIVE mg/dL   Urobilinogen, UA 0.2  0.0 - 1.0 mg/dL   Nitrite NEGATIVE  NEGATIVE   Leukocytes, UA MODERATE (*) NEGATIVE  TROPONIN I      Result Value Ref Range   Troponin I 0.82 (*) <0.30 ng/mL  URINE MICROSCOPIC-ADD ON      Result Value Ref Range   Squamous Epithelial / LPF FEW (*) RARE   WBC, UA TOO NUMEROUS TO COUNT  <3 WBC/hpf   RBC / HPF TOO NUMEROUS TO COUNT  <3 RBC/hpf   Bacteria, UA MANY (*) RARE  BASIC METABOLIC PANEL      Result Value Ref Range   Sodium 143  137 - 147 mEq/L   Potassium 4.5  3.7 - 5.3 mEq/L   Chloride 108  96 - 112 mEq/L   CO2 23  19 - 32 mEq/L   Glucose, Bld 137 (*) 70 - 99 mg/dL   BUN 32 (*) 6 - 23 mg/dL   Creatinine, Ser 2.42 (*) 0.50 - 1.35 mg/dL   Calcium 9.3  8.4 - 10.5 mg/dL   GFR calc non Af Amer 23 (*) >90 mL/min   GFR calc Af  Amer 27 (*) >90 mL/min   Anion gap 12  5 - 15  CBC      Result Value Ref Range   WBC 7.3  4.0 - 10.5 K/uL   RBC 3.56 (*) 4.22 - 5.81 MIL/uL   Hemoglobin 10.8 (*) 13.0 - 17.0 g/dL   HCT 31.8 (*) 39.0 - 52.0 %   MCV 89.3  78.0 - 100.0 fL   MCH 30.3  26.0 - 34.0 pg   MCHC 34.0  30.0 - 36.0 g/dL   RDW 12.6  11.5 - 15.5 %   Platelets 258  150 - 400 K/uL  HEMOGLOBIN A1C      Result Value Ref Range   Hemoglobin A1C 5.4  <5.7 %   Mean Plasma Glucose 108  <117 mg/dL  LIPID PANEL      Result Value Ref Range   Cholesterol 208 (*) 0 - 200 mg/dL   Triglycerides 88  <150 mg/dL   HDL 30 (*) >39 mg/dL   Total CHOL/HDL Ratio 6.9     VLDL 18  0 - 40 mg/dL   LDL Cholesterol 160 (*) 0 - 99 mg/dL  TROPONIN I      Result Value Ref Range   Troponin I 1.56 (*) <0.30 ng/mL  TROPONIN I      Result Value Ref Range   Troponin I 1.89 (*) <0.30 ng/mL  TROPONIN I      Result Value Ref Range   Troponin I 1.43 (*) <0.30 ng/mL  GLUCOSE, CAPILLARY      Result Value Ref Range   Glucose-Capillary 196 (*) 70 - 99 mg/dL   Comment 1 Notify RN    GLUCOSE, CAPILLARY      Result Value  Ref Range   Glucose-Capillary 173 (*) 70 - 99 mg/dL   Comment 1 Notify RN    GLUCOSE, CAPILLARY      Result Value Ref Range   Glucose-Capillary 134 (*) 70 - 99 mg/dL   Comment 1 Notify RN    GLUCOSE, CAPILLARY      Result Value Ref Range   Glucose-Capillary 80  70 - 99 mg/dL  GLUCOSE, CAPILLARY      Result Value Ref Range   Glucose-Capillary 62 (*) 70 - 99 mg/dL  GLUCOSE, CAPILLARY      Result Value Ref Range   Glucose-Capillary 118 (*) 70 - 99 mg/dL  I-STAT CHEM 8, ED      Result Value Ref Range   Sodium 144  137 - 147 mEq/L   Potassium 4.4  3.7 - 5.3 mEq/L   Chloride 110  96 - 112 mEq/L   BUN 31 (*) 6 - 23 mg/dL   Creatinine, Ser 2.50 (*) 0.50 - 1.35 mg/dL   Glucose, Bld 174 (*) 70 - 99 mg/dL   Calcium, Ion 1.18  1.13 - 1.30 mmol/L   TCO2 22  0 - 100 mmol/L   Hemoglobin 12.9 (*) 13.0 - 17.0 g/dL   HCT 38.0 (*) 39.0 - 52.0 %  I-STAT TROPOININ, ED      Result Value Ref Range   Troponin i, poc 0.52 (*) 0.00 - 0.08 ng/mL   Comment NOTIFIED PHYSICIAN     Comment 3           CBG MONITORING, ED      Result Value Ref Range   Glucose-Capillary 154 (*) 70 - 99 mg/dL      Records Reviewed: Old medical records. Previous electrocardiograms. Nursing notes. Previous radiology studies.  Consultations:  Neurohospitalist, Internal medicine  Disposition: Admitted to telemetry, the case was discussed with the admitting physician Dr. Maebelle Munroe, PA-C 04/02/14 2051

## 2014-04-01 NOTE — Consult Note (Signed)
Neurology Consultation Reason for Consult: New-onset left-sided weakness Referring Physician: Darl Householder, D  CC: Left-sided weakness  History is obtained from: Chart  HPI: [name] is a 80 y.o. male with a history of dementia he was apparently able to get up and move around at baseline who presents today with new left-sided weakness. It is unclear exactly when he was last well but it has been at least since yesterday.  He was brought into the emergency room where a CT of his head shows bilateral posterior quadrant infarcts which I feel are most consistent with bilateral posterior watershed chronic infarcts.  The patient is unable to give much in the way of history.  ROS:  Unable to obtain due to altered mental status.   Past Medical History  Diagnosis Date  . Coronary artery disease     post CABG in 1997 with abnormal nuclear stress test in 2007 which did demonstrate mild reversible ischemia and has been managed medically.   . Diabetes mellitus     type 2  . Hypertension   . Hyperlipidemia   . Hypercholesterolemia   . Atypical chest pain   . Hypernatremia   . History of hypoglycemia   . Syncope   . CVA (cerebral infarction)   . Allergy   . History of chicken pox   . Acute on chronic renal insufficiency   . CKD (chronic kidney disease)   . GERD (gastroesophageal reflux disease)   . H/O hiatal hernia     Family History: Unable to obtain due to altered mental status.  Social History: Tob: Unable to obtain due to altered mental status.  Exam: Current vital signs: BP 157/82  Pulse 75  Temp(Src) 98.2 F (36.8 C) (Oral)  Resp 18  Ht 6\' 1"  (1.854 m)  Wt 63.866 kg (140 lb 12.8 oz)  BMI 18.58 kg/m2  SpO2 100% Vital signs in last 24 hours: Temp:  [97.7 F (36.5 C)-98.2 F (36.8 C)] 98.2 F (36.8 C) (10/02 1923) Pulse Rate:  [65-82] 75 (10/02 1923) Resp:  [12-21] 18 (10/02 1923) BP: (125-183)/(72-104) 157/82 mmHg (10/02 1923) SpO2:  [98 %-100 %] 100 % (10/02  1923) Weight:  [63.866 kg (140 lb 12.8 oz)] 63.866 kg (140 lb 12.8 oz) (10/02 1923)  General: In bed, NAD CV: Regular rate and rhythm Mental Status: Patient is awake, alert, he is able to tell me his name and follow simple commands. He is unable to answer any even slightly complex questions. He appears to not be attending well to left side. Cranial Nerves: II: Pleased to threat from the right, but not from the left Pupils are equal, round, and reactive to light.  Discs are difficult to visualize. III,IV, VI: EOMI without ptosis or diploplia.  V: Facial sensation is symmetric to temperature VII: Facial movement is notable for flattening of the left nasolabial fold VIII: hearing is intact to voice X: Uvula elevates symmetrically XI: Shoulder shrug is symmetric. XII: tongue is midline without atrophy or fasciculations.  Motor: Tone is normal. Bulk is normal. He moves his right side well, but is not moving his left side is much. With repositioning, he does have some strength but I suspect that he is neglecting it. I suspect that he does have weakness in it as well. Sensory: He responds less to noxious stimulation on the left side than the right Deep Tendon Reflexes: 2+ and symmetric in the biceps and patellae.  Cerebellar: Unable to obtain due to altered mental status. Gait: Unable to obtain  due to altered mental status.   I have reviewed labs in epic and the results pertinent to this consultation are: elevated creatinine   I have reviewed the images obtained: CT head-bilateral posterior watershed distribution infarcts  Impression: 80 year old male with bilateral previous infarct who presents with new left-sided neglect and weakness. I suspect that he has had another ischemic stroke. There is no hyperdensity to suggest Active hemorrhage in his left-sided hygroma. It is slightly larger than on previous scanning, I question if this is due to technique or true enlargement. I do not think  that is contributing to his left-sided weakness.  Recommendations: 1. HgbA1c, fasting lipid panel 2. MRI, MRA  of the brain without contrast, MRA  neck without contrast (due to renal failure) to possibly get a look at the posterior circulation  3. Frequent neuro checks 4. Echocardiogram 5. Carotid dopplers 6. Prophylactic therapy-Antiplatelet med: Aspirin - dose 325mg  PO or 300mg  PR 7. Risk factor modification 8. Telemetry monitoring 9. PT consult, OT consult, Speech consult    Roland Rack, MD [facility] [phone]  If 7pm- 7am, please page neurology on call as listed in [place].

## 2014-04-01 NOTE — ED Notes (Signed)
Hospitalist at bedside 

## 2014-04-02 ENCOUNTER — Inpatient Hospital Stay (HOSPITAL_COMMUNITY): Payer: Medicare PPO

## 2014-04-02 DIAGNOSIS — I059 Rheumatic mitral valve disease, unspecified: Secondary | ICD-10-CM

## 2014-04-02 DIAGNOSIS — Z794 Long term (current) use of insulin: Secondary | ICD-10-CM

## 2014-04-02 DIAGNOSIS — I6330 Cerebral infarction due to thrombosis of unspecified cerebral artery: Secondary | ICD-10-CM | POA: Insufficient documentation

## 2014-04-02 DIAGNOSIS — E119 Type 2 diabetes mellitus without complications: Secondary | ICD-10-CM

## 2014-04-02 DIAGNOSIS — R7989 Other specified abnormal findings of blood chemistry: Secondary | ICD-10-CM

## 2014-04-02 LAB — CBC
HCT: 31.8 % — ABNORMAL LOW (ref 39.0–52.0)
Hemoglobin: 10.8 g/dL — ABNORMAL LOW (ref 13.0–17.0)
MCH: 30.3 pg (ref 26.0–34.0)
MCHC: 34 g/dL (ref 30.0–36.0)
MCV: 89.3 fL (ref 78.0–100.0)
PLATELETS: 258 10*3/uL (ref 150–400)
RBC: 3.56 MIL/uL — ABNORMAL LOW (ref 4.22–5.81)
RDW: 12.6 % (ref 11.5–15.5)
WBC: 7.3 10*3/uL (ref 4.0–10.5)

## 2014-04-02 LAB — BASIC METABOLIC PANEL
ANION GAP: 12 (ref 5–15)
BUN: 32 mg/dL — ABNORMAL HIGH (ref 6–23)
CALCIUM: 9.3 mg/dL (ref 8.4–10.5)
CHLORIDE: 108 meq/L (ref 96–112)
CO2: 23 mEq/L (ref 19–32)
CREATININE: 2.42 mg/dL — AB (ref 0.50–1.35)
GFR calc Af Amer: 27 mL/min — ABNORMAL LOW (ref >90)
GFR, EST NON AFRICAN AMERICAN: 23 mL/min — AB (ref >90)
Glucose, Bld: 137 mg/dL — ABNORMAL HIGH (ref 70–99)
Potassium: 4.5 mEq/L (ref 3.7–5.3)
Sodium: 143 mEq/L (ref 137–147)

## 2014-04-02 LAB — GLUCOSE, CAPILLARY
GLUCOSE-CAPILLARY: 118 mg/dL — AB (ref 70–99)
GLUCOSE-CAPILLARY: 162 mg/dL — AB (ref 70–99)
GLUCOSE-CAPILLARY: 173 mg/dL — AB (ref 70–99)
GLUCOSE-CAPILLARY: 62 mg/dL — AB (ref 70–99)
GLUCOSE-CAPILLARY: 80 mg/dL (ref 70–99)
Glucose-Capillary: 134 mg/dL — ABNORMAL HIGH (ref 70–99)

## 2014-04-02 LAB — LIPID PANEL
Cholesterol: 208 mg/dL — ABNORMAL HIGH (ref 0–200)
HDL: 30 mg/dL — ABNORMAL LOW (ref >39)
LDL CALC: 160 mg/dL — AB (ref 0–99)
Total CHOL/HDL Ratio: 6.9 RATIO
Triglycerides: 88 mg/dL (ref <150)
VLDL: 18 mg/dL (ref 0–40)

## 2014-04-02 LAB — HEMOGLOBIN A1C
HEMOGLOBIN A1C: 5.4 % (ref <5.7)
MEAN PLASMA GLUCOSE: 108 mg/dL (ref <117)

## 2014-04-02 LAB — TROPONIN I
TROPONIN I: 1.89 ng/mL — AB (ref <0.30)
Troponin I: 1.43 ng/mL (ref <0.30)

## 2014-04-02 MED ORDER — INFLUENZA VAC SPLIT QUAD 0.5 ML IM SUSY
0.5000 mL | PREFILLED_SYRINGE | INTRAMUSCULAR | Status: DC
  Filled 2014-04-02: qty 0.5

## 2014-04-02 MED ORDER — ATORVASTATIN CALCIUM 20 MG PO TABS
20.0000 mg | ORAL_TABLET | Freq: Every day | ORAL | Status: DC
  Administered 2014-04-03 – 2014-04-04 (×2): 20 mg via ORAL
  Filled 2014-04-02 (×4): qty 1

## 2014-04-02 NOTE — H&P (Signed)
[facility] PROGRESS NOTE  [name] NGE:[id number] DOB: 09/02/1927 DOA: 04/01/2014 PCP: Webb Silversmith, NP  Assessment/Plan: Acute Stroke: -MRI; Multiple areas of restricted diffusion affecting the RIGHT anterior circulation consistent with acute infarction. -Patient admitted to the hospital for further evaluation.  -CT scan done in the ED and showed no evidence of acute events.  -LDL 160, will start statins.   UTI  -Patient started empirically on Rocephin, urinalysis consistent with UTI.  -Adjust antibiotics according to the culture results.   Elevated troponin  -Patient is on beta blockers, continued started on aspirin. Started on statin.  -Patient has old subdural hematoma, no change since last year, I think aspirin would be safe.   -Likely no intervention as this probably might be secondary to his CVA or elevated creatinine,  -troponin elevated. Will check ECHO. He will be high risk for CATH.   Diabetes mellitus  -hold glipizide.  -Started on insulin sliding scale.   CKD stage III  -Creatinine is 2.38, this is around his baseline. Monitor while in the hospital.   SDH  -Has chronic right-sided SDH, no mass effect, no changes since about a year ago.  -No active bleeding, aspirin started. Continue with aspirin if ok with neurology   Code Status: Full Code.  Family Communication: multiple family member at bedside.  Disposition Plan: remain inpatient.    Consultants:  nuerology  Procedures: ECHO; pending Doppler; Right: 60-79% internal carotid artery stenosis. Left: 1-39% ICA stenosis. Bilateral: Vertebral artery flow is antegrade.     Antibiotics:  none  HPI/Subjective: Denies chest pain.   Objective: Filed Vitals:   04/02/14 1030  BP: 128/70  Pulse: 85  Temp: 97.9 F (36.6 C)  Resp: 18    Intake/Output Summary (Last 24 hours) at 04/02/14 1358 Last data filed at 04/02/14 0526  Gross per 24 hour  Intake      0 ml  Output    450 ml  Net    -450 ml   Filed Weights   04/01/14 1923 04/02/14 0300  Weight: 63.866 kg (140 lb 12.8 oz) 63.655 kg (140 lb 5.3 oz)    Exam:   General:  Alert in no distress.   Cardiovascular: S 1, S 2 RRR  Respiratory: few ronchus.   Abdomen: bs present, soft, nt  Musculoskeletal: trace edema.   Neuro; alert answer yes and no. Left side with weakness, and muscle contracture.   Data Reviewed: Basic Metabolic Panel:  Recent Labs Lab 04/01/14 1541 04/01/14 1554 04/02/14 0249  NA 144 144 143  K 4.7 4.4 4.5  CL 107 110 108  CO2 22  --  23  GLUCOSE 171* 174* 137*  BUN 31* 31* 32*  CREATININE 2.38* 2.50* 2.42*  CALCIUM 9.1  --  9.3   Liver Function Tests:  Recent Labs Lab 04/01/14 1541  AST 15  ALT 6  ALKPHOS 61  BILITOT 0.4  PROT 8.1  ALBUMIN 3.6   No results found for this basename: LIPASE, AMYLASE,  in the last 168 hours No results found for this basename: AMMONIA,  in the last 168 hours CBC:  Recent Labs Lab 04/01/14 1541 04/01/14 1554 04/02/14 0249  WBC 7.1  --  7.3  NEUTROABS 5.8  --   --   HGB 11.3* 12.9* 10.8*  HCT 33.3* 38.0* 31.8*  MCV 92.0  --  89.3  PLT 257  --  258   Cardiac Enzymes:  Recent Labs Lab 04/01/14 1540 04/01/14 2145 04/02/14 0249  TROPONINI 0.82* 1.56*  1.89*   BNP (last 3 results) No results found for this basename: PROBNP,  in the last 8760 hours CBG:  Recent Labs Lab 04/01/14 2013 04/02/14 0001 04/02/14 0356 04/02/14 0733 04/02/14 1151  GLUCAP 196* 173* 134* 80 62*    No results found for this or any previous visit (from the past 240 hour(s)).   Studies: Ct Head Wo Contrast  04/01/2014   CLINICAL DATA:  80 year old male with left-sided weakness and pain.  EXAM: CT HEAD WITHOUT CONTRAST  TECHNIQUE: Contiguous axial images were obtained from the base of the skull through the vertex without intravenous contrast.  COMPARISON:  Head CT 03/30/2013, 03/11/2013.  MRI 03/15/2013  FINDINGS: Unremarkable appearance of the  calvarium with no acute fracture or aggressive lesion.  No paranasal sinus disease.  Mastoid air cells are clear.  Unremarkable appearance of the bilateral orbits.  Re- demonstration of heterogeneous high right parietal subdural collection without associated mass effect. This is essentially unchanged in greatest thickness and distribution as compared to prior CT and MRI.  Extra-axial collection overlying the left frontal region measures slightly greater as compared to the prior CT, previously 16 mm, and now 19 mm. Persistent mild mass effect on the underlying brain. Unchanged appearance of right frontal CSF density collection.  No region of new or acute intracranial hemorrhage.  New region of hypodensity in the left temporoparietal region extending to the Guertin matter.  Re- demonstration of encephalomalacic changes of the right temporoparietal region.  Unchanged configuration of the ventricular system.  Similar appearance of periventricular white matter changes, particularly overlying the left frontal horn, as well as the right hemisphere.  Dense intracranial atherosclerotic calcifications.  IMPRESSION: No new or acute hemorrhage. There is similar appearance of right parietal subdural collection (present on study of 1 year prior) without associated mass effect.  The left frontal hygroma measures slightly greater than on the comparison CT, previously 16 mm and now 19 mm. Configuration of the underlying brain is similar to prior. Also, similar appearance of right frontal hygroma.  New region of encephalomalacia of left temporal parietal brain, which appears chronic/remote. If there was concern for acute component, MR may be indicated.  Intracranial atherosclerosis.  Signed,  Dulcy Fanny. Earleen Newport, DO  [facility]  [facility]   Electronically Signed   By: Corrie Mckusick D.O.   On: 04/01/2014 15:10   Mr Angiogram Neck Wo Contrast  04/02/2014   CLINICAL DATA:  New onset of  LEFT-sided weakness. Exact time of onset is uncertain, but likely greater than 24 hr duration. Chronic extra-axial fluid collections are reported. Stroke risk factors include diabetes, hypertension, hyperlipidemia, and history of previous stroke.  EXAM: MRI HEAD WITHOUT CONTRAST  MRA HEAD WITHOUT CONTRAST  MRA NECK WITHOUT CONTRAST  TECHNIQUE: Multiplanar, multiecho pulse sequences of the brain and surrounding structures were obtained without intravenous contrast. Angiographic images of the Circle of Willis were obtained using MRA technique without intravenous contrast. Angiographic images of the neck were obtained using MRA technique without intravenous contrast. Carotid stenosis measurements (when applicable) are obtained utilizing NASCET criteria, using the distal internal carotid diameter as the denominator.  COMPARISON:  CT head 04/01/2014. MR head 03/15/2013. MRI and MRA 12/06/2011  FINDINGS: MRI HEAD FINDINGS  The patient was unable to remain motionless for the exam. Small or subtle lesions could be overlooked. Overall exam diagnostic.  Areas of gyriform restricted diffusion affect the RIGHT frontal and RIGHT posterior frontal cortex in the parasagittal location, the consistent with acute  infarction. Single focus of restricted diffusion more posteriorly adjacent to the old hemorrhagic infarct. No other areas of restricted diffusion.  BILATERAL areas of cortical infarction, some which display chronic hemorrhage appears stable. Chronic of LEFT frontotemporoparietal subdural hygroma, CSF intensity on all pulse sequences, measures up to 19 mm, with moderate mass effect on the LEFT hemisphere, slightly increased in 2014. There is a much smaller RIGHT parietal collection, more complex, up to 7 mm thick without significant mass effect, likely representing combination of hygroma and chronic subdural hematoma, essentially stable from 2014.  Flow voids are maintained. No midline abnormality. No acute sinus or mastoid  disease.  MRA HEAD FINDINGS  Widely patent internal carotid artery on the LEFT. 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 Proximal flow reducing lesion is discussed in the MRA Neck section. There is suspected 50% stenosis of the cavernous and supraclinoid ICA on the RIGHT, non flow reducing. Azygos LEFT ACA supplies both anterior cerebrals. No MCA stenosis or branch occlusion. Basilar artery widely patent with LEFT vertebral dominant. Mildly diseased V4 segment distal RIGHT vertebral. No cerebellar branch occlusion. Mild irregularity both proximal posterior cerebral arteries.  MRA NECK FINDINGS  No priors for comparison. There is suspected flow reducing stenosis of the RIGHT ICA, estimated 75-90%. Distal right common carotid artery plaque narrows that vessel without definite ulceration. Unremarkable appearing LEFT carotid bifurcation. Both vertebrals are patent, with the LEFT dominant.  IMPRESSION: Multiple areas of restricted diffusion affecting the RIGHT anterior circulation consistent with acute infarction.  Slight increased size of the LEFT frontotemporoparietal subdural hygroma, now 19 mm thick. Stable chronic mixed signal intensity RIGHT hemisphere extra-axial collection.  Diminished caliber of the RIGHT ICA compared to the LEFT, progressive since 2013, relates to an apparent flow reducing 75-90% stenosis at the RICA origin.   Electronically Signed   By: Rolla Flatten M.D.   On: 04/02/2014 11:33   [name]  04/02/2014   CLINICAL DATA:  New onset of LEFT-sided weakness. Exact time of onset is uncertain, but likely greater than 24 hr duration. Chronic extra-axial fluid collections are reported. Stroke risk factors include diabetes, hypertension, hyperlipidemia, and history of previous stroke.  EXAM: MRI HEAD WITHOUT CONTRAST  MRA HEAD WITHOUT CONTRAST   MRA NECK WITHOUT CONTRAST  TECHNIQUE: Multiplanar, multiecho pulse sequences of the brain and surrounding structures were obtained without intravenous contrast. Angiographic images of the Circle of Willis were obtained using MRA technique without intravenous contrast. Angiographic images of the neck were obtained using MRA technique without intravenous contrast. Carotid stenosis measurements (when applicable) are obtained utilizing NASCET criteria, using the distal internal carotid diameter as the denominator.  COMPARISON:  CT head 04/01/2014. MR head 03/15/2013. MRI and MRA 12/06/2011  FINDINGS: MRI HEAD FINDINGS  The patient was unable to remain motionless for the exam. Small or subtle lesions could be overlooked. Overall exam diagnostic.  Areas of gyriform restricted diffusion affect the RIGHT frontal and RIGHT posterior frontal cortex in the parasagittal location, the consistent with acute infarction. Single focus of restricted diffusion more posteriorly adjacent to the old hemorrhagic infarct. No other areas of restricted diffusion.  BILATERAL areas of cortical infarction, some which display chronic hemorrhage appears stable. Chronic of LEFT frontotemporoparietal subdural hygroma, CSF intensity on all pulse sequences, measures up to 19 mm, with moderate mass effect on the LEFT hemisphere, slightly increased in 2014.  There is a much smaller RIGHT parietal collection, more complex, up to 7 mm thick without significant mass effect, likely representing combination of hygroma and chronic subdural hematoma, essentially stable from 2014.  Flow voids are maintained. No midline abnormality. No acute sinus or mastoid disease.  MRA HEAD FINDINGS  Widely patent internal carotid artery on the LEFT. 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 Proximal flow reducing lesion is  discussed in the MRA Neck section. There is suspected 50% stenosis of the cavernous and supraclinoid ICA on the RIGHT, non flow reducing. Azygos LEFT ACA supplies both anterior cerebrals. No MCA stenosis or branch occlusion. Basilar artery widely patent with LEFT vertebral dominant. Mildly diseased V4 segment distal RIGHT vertebral. No cerebellar branch occlusion. Mild irregularity both proximal posterior cerebral arteries.  MRA NECK FINDINGS  No priors for comparison. There is suspected flow reducing stenosis of the RIGHT ICA, estimated 75-90%. Distal right common carotid artery plaque narrows that vessel without definite ulceration. Unremarkable appearing LEFT carotid bifurcation. Both vertebrals are patent, with the LEFT dominant.  IMPRESSION: Multiple areas of restricted diffusion affecting the RIGHT anterior circulation consistent with acute infarction.  Slight increased size of the LEFT frontotemporoparietal subdural hygroma, now 19 mm thick. Stable chronic mixed signal intensity RIGHT hemisphere extra-axial collection.  Diminished caliber of the RIGHT ICA compared to the LEFT, progressive since 2013, relates to an apparent flow reducing 75-90% stenosis at the RICA origin.   Electronically Signed   By: Rolla Flatten M.D.   On: 04/02/2014 11:33   Mr Jodene Nam Head/brain Wo Cm  04/02/2014   CLINICAL DATA:  New onset of LEFT-sided weakness. Exact time of onset is uncertain, but likely greater than 24 hr duration. Chronic extra-axial fluid collections are reported. Stroke risk factors include diabetes, hypertension, hyperlipidemia, and history of previous stroke.  EXAM: MRI HEAD WITHOUT CONTRAST  MRA HEAD WITHOUT CONTRAST  MRA NECK WITHOUT CONTRAST  TECHNIQUE: Multiplanar, multiecho pulse sequences of the brain and surrounding structures were obtained without intravenous contrast. Angiographic images of the Circle of Willis were obtained using MRA technique without intravenous contrast. Angiographic images of the neck  were obtained using MRA technique without intravenous contrast. Carotid stenosis measurements (when applicable) are obtained utilizing NASCET criteria, using the distal internal carotid diameter as the denominator.  COMPARISON:  CT head 04/01/2014. MR head 03/15/2013. MRI and MRA 12/06/2011  FINDINGS: MRI HEAD FINDINGS  The patient was unable to remain motionless for the exam. Small or subtle lesions could be overlooked. Overall exam diagnostic.  Areas of gyriform restricted diffusion affect the RIGHT frontal and RIGHT posterior frontal cortex in the parasagittal location, the consistent with acute infarction. Single focus of restricted diffusion more posteriorly adjacent to the old hemorrhagic infarct. No other areas of restricted diffusion.  BILATERAL areas of cortical infarction, some which display chronic hemorrhage appears stable. Chronic of LEFT frontotemporoparietal subdural hygroma, CSF intensity on all pulse sequences, measures up to 19 mm, with moderate mass effect on the LEFT hemisphere, slightly increased in 2014. There is a much smaller RIGHT parietal collection, more complex, up to 7 mm thick without significant mass effect, likely representing combination of hygroma and chronic subdural hematoma, essentially stable from 2014.  Flow voids are maintained. No midline abnormality. No acute sinus or mastoid disease.  MRA HEAD FINDINGS  Widely patent internal carotid artery on the LEFT. Diminished caliber  of the RIGHT internal carotid artery compared to the LEFT, in part due to the hypoplastic A1 ACA, but interval change in appearance compared with previous MRA 12/06/2011. Proximal flow reducing lesion is discussed in the MRA Neck section. There is suspected 50% stenosis of the cavernous and supraclinoid ICA on the RIGHT, non flow reducing. Azygos LEFT ACA supplies both anterior cerebrals. No MCA stenosis or branch occlusion. Basilar artery widely patent with LEFT vertebral dominant. Mildly diseased V4  segment distal RIGHT vertebral. No cerebellar branch occlusion. Mild irregularity both proximal posterior cerebral arteries.  MRA NECK FINDINGS  No priors for comparison. There is suspected flow reducing stenosis of the RIGHT ICA, estimated 75-90%. Distal right common carotid artery plaque narrows that vessel without definite ulceration. Unremarkable appearing LEFT carotid bifurcation. Both vertebrals are patent, with the LEFT dominant.  IMPRESSION: Multiple areas of restricted diffusion affecting the RIGHT anterior circulation consistent with acute infarction.  Slight increased size of the LEFT frontotemporoparietal subdural hygroma, now 19 mm thick. Stable chronic mixed signal intensity RIGHT hemisphere extra-axial collection.  Diminished caliber of the RIGHT ICA compared to the LEFT, progressive since 2013, relates to an apparent flow reducing 75-90% stenosis at the RICA origin.   Electronically Signed   By: Rolla Flatten M.D.   On: 04/02/2014 11:33    Scheduled Meds: . aspirin  300 mg Rectal Daily   Or  . aspirin  325 mg Oral Daily  . cefTRIAXone (ROCEPHIN)  IV  1 g Intravenous Q24H  . heparin  5,000 Units Subcutaneous 3 times per day  . insulin aspart  0-9 Units Subcutaneous TID WC  . isosorbide mononitrate  30 mg Oral Daily  . metoprolol  50 mg Oral BID  . pantoprazole  40 mg Oral Daily   Continuous Infusions:   Principal Problem:   Acute left-sided weakness Active Problems:   Ischemic heart disease   Diabetes mellitus type 2, insulin dependent   CKD (chronic kidney disease)   HTN (hypertension)   SDH (subdural hematoma)   UTI (lower urinary tract infection)   Elevated troponin   Cerebral thrombosis with cerebral infarction    Time spent: 35 minutes.     Niel Hummer A  [facility] Pager [phone]. If 7PM-7AM, please contact night-coverage at [email or web address], password [facility] 04/02/2014, 1:58 PM  LOS: 1 day

## 2014-04-02 NOTE — Progress Notes (Signed)
Patient had rhythm change to 2 degree type 2, bradycardia in the 30's, then back up to NSR/SB 50's-60's. Pt unable to express if he felt any changes due to acute infart. NP M.Lynch made aware, no new orders given. Will continue to monitor patient.

## 2014-04-02 NOTE — Progress Notes (Signed)
Blood tinged urine noted. Will update Dr. Tyrell Antonio via text page. Family also request to speak with MD. Will contact MD for above request.

## 2014-04-02 NOTE — Progress Notes (Signed)
  Echocardiogram 2D Echocardiogram has been performed.  [name] 04/02/2014, 3:27 PM

## 2014-04-02 NOTE — Progress Notes (Signed)
PT Cancellation Note  Patient Details Name: [name] MRN: [id number] DOB: Jun 09, 1928   Cancelled Treatment:    Reason Eval/Treat Not Completed: Medical issues which prohibited therapy. Pt with elevated troponin which is currently trending up. Will hold PT eval at this time and check back as schedule allows.    Rolinda Roan 04/02/2014, 8:04 AM  Rolinda Roan, PT, DPT Acute Rehabilitation Services Pager: [phone]

## 2014-04-02 NOTE — Progress Notes (Signed)
VASCULAR LAB PRELIMINARY  PRELIMINARY  PRELIMINARY  PRELIMINARY  Carotid duplex  completed.    Preliminary report:  Right:  60-79% internal carotid artery stenosis.   Left:  1-39% ICA stenosis. Bilateral:  Vertebral artery flow is antegrade.     Patterson Hollenbaugh, RVT 04/02/2014, 1:28 PM

## 2014-04-02 NOTE — ED Provider Notes (Signed)
Medical screening examination/treatment/procedure(s) were conducted as a shared visit with non-physician practitioner(s) and myself.  I personally evaluated the patient during the encounter.   EKG Interpretation   Date/Time:  Friday April 01 2014 13:54:47 EDT Ventricular Rate:  80 PR Interval:  158 QRS Duration: 84 QT Interval:  416 QTC Calculation: 480 R Axis:   42 Text Interpretation:  Sinus rhythm Sinus pause Borderline prolonged QT  interval No significant change since last tracing Confirmed by YAO  MD,  DAVID ([id number]) on 04/01/2014 2:01:28 PM      [name] is a 80 y.o. male hx of CAD, DM here with L arm and leg weakness and confusion. Onset since yesterday. Had an episode of incontinence. On exam, weakness on L side. CT head showed new encephalomalacia. Trop positive. Patient given ASA, has no chest pain. Will admit to tele.    Wandra Arthurs, MD 04/02/14 [phone]

## 2014-04-02 NOTE — Progress Notes (Signed)
STROKE TEAM PROGRESS NOTE   HISTORY [name] is a 80 y.o. male with a history of dementia he was apparently able to get up and move around at baseline who presents today with new left-sided weakness. It is unclear exactly when he was last well but it has been at least since yesterday.  He was brought into the emergency room where a CT of his head shows bilateral posterior quadrant infarcts which I feel are most consistent with bilateral posterior watershed chronic infarcts.  Patient was not administered TPA secondary to delay in arrival. He was admitted for further evaluation and treatment.   SUBJECTIVE (INTERVAL HISTORY) His family is at the bedside.  Overall family feels his condition is gradually worsening. The report he was able to talk and follow commands yesterday; he is no longer able to do this. Lives ah home - fairly independent with ADLs prior to admission. Someone would check on him daily.   OBJECTIVE Temp:  [97.7 F (36.5 C)-98.4 F (36.9 C)] 97.9 F (36.6 C) (10/03 1030) Pulse Rate:  [64-85] 85 (10/03 1030) Cardiac Rhythm:  [-]  Resp:  [12-21] 18 (10/03 1030) BP: (98-183)/(54-104) 128/70 mmHg (10/03 1030) SpO2:  [98 %-100 %] 99 % (10/03 1030) Weight:  [63.655 kg (140 lb 5.3 oz)-63.866 kg (140 lb 12.8 oz)] 63.655 kg (140 lb 5.3 oz) (10/03 0300)   Recent Labs Lab 04/01/14 2013 04/02/14 0001 04/02/14 0356 04/02/14 0733 04/02/14 1151  GLUCAP 196* 173* 134* 80 62*    Recent Labs Lab 04/01/14 1541 04/01/14 1554 04/02/14 0249  NA 144 144 143  K 4.7 4.4 4.5  CL 107 110 108  CO2 22  --  23  GLUCOSE 171* 174* 137*  BUN 31* 31* 32*  CREATININE 2.38* 2.50* 2.42*  CALCIUM 9.1  --  9.3    Recent Labs Lab 04/01/14 1541  AST 15  ALT 6  ALKPHOS 61  BILITOT 0.4  PROT 8.1  ALBUMIN 3.6    Recent Labs Lab 04/01/14 1541 04/01/14 1554 04/02/14 0249  WBC 7.1  --  7.3  NEUTROABS 5.8  --   --   HGB 11.3* 12.9* 10.8*  HCT 33.3* 38.0* 31.8*  MCV 92.0  --  89.3   PLT 257  --  258    Recent Labs Lab 04/01/14 1540 04/01/14 2145 04/02/14 0249  TROPONINI 0.82* 1.56* 1.89*    Recent Labs  04/01/14 1541  LABPROT 14.3  INR 1.11    Recent Labs  04/01/14 1600  COLORURINE YELLOW  LABSPEC 1.010  PHURINE 5.0  GLUCOSEU NEGATIVE  HGBUR LARGE*  BILIRUBINUR NEGATIVE  KETONESUR NEGATIVE  PROTEINUR 100*  UROBILINOGEN 0.2  NITRITE NEGATIVE  LEUKOCYTESUR MODERATE*       Component Value Date/Time   CHOL 208* 04/02/2014 0259   TRIG 88 04/02/2014 0259   HDL 30* 04/02/2014 0259   CHOLHDL 6.9 04/02/2014 0259   VLDL 18 04/02/2014 0259   LDLCALC 160* 04/02/2014 0259   Lab Results  Component Value Date   HGBA1C 7.1* 03/13/2013      Component Value Date/Time   LABOPIA NONE DETECTED 04/01/2014 1600   COCAINSCRNUR NONE DETECTED 04/01/2014 1600   LABBENZ NONE DETECTED 04/01/2014 1600   AMPHETMU NONE DETECTED 04/01/2014 1600   THCU NONE DETECTED 04/01/2014 1600   LABBARB NONE DETECTED 04/01/2014 1600     Recent Labs Lab 04/01/14 1541  ETH <11    Ct Head Wo Contrast 04/01/2014   No new or acute hemorrhage. There is similar  appearance of right parietal subdural collection (present on study of 1 year prior) without associated mass effect.  The left frontal hygroma measures slightly greater than on the comparison CT, previously 16 mm and now 19 mm. Configuration of the underlying brain is similar to prior. Also, similar appearance of right frontal hygroma.  New region of encephalomalacia of left temporal parietal brain, which appears chronic/remote. If there was concern for acute component, MR may be indicated.  Intracranial atherosclerosis.    [name] 04/02/2014    Multiple areas of restricted diffusion affecting the RIGHT anterior circulation consistent with acute infarction.  Slight increased size of the LEFT frontotemporoparietal subdural hygroma, now 19 mm thick. Stable chronic mixed signal intensity RIGHT hemisphere extra-axial collection.    [name]  04/02/2014 Diminished caliber of the RIGHT ICA compared to the LEFT, progressive since 2013, relates to an apparent flow reducing 75-90% stenosis at the RICA origin.     Carotid duplex Right: 60-79% internal carotid artery stenosis. Left: 1-39% ICA stenosis. Bilateral: Vertebral artery flow is antegrade.    PHYSICAL EXAM Elderly male not in distress.Awake alert. Afebrile. Head is nontraumatic. Neck is supple without bruit. Hearing is normal. Cardiac exam no murmur or gallop. Lungs are clear to auscultation. Distal pulses are well felt. Mental Status:  Patient is awake, alert, he is able to tell me his name and follow simple commands. He is unable to answer any even slightly complex questions.  He appears to not be attending well to left side.  Cranial Nerves:  II: Pleased to threat from the right, but not from the left Pupils are equal, round, and reactive to light. Discs are difficult to visualize.  III,IV, VI: EOMI without ptosis or diploplia.  V: Facial sensation is symmetric to temperature  VII: Facial movement is notable for flattening of the left nasolabial fold  VIII: hearing is intact to voice  X: Uvula elevates symmetrically  XI: Shoulder shrug is symmetric.  XII: tongue is midline without atrophy or fasciculations.  Motor:  Tone is normal. Bulk is normal. He moves his right side well, but is not moving his left side is much. With repositioning, he does have some strength but I suspect that he is neglecting it. I suspect that he does have weakness in it as well.  Sensory:  He responds less to noxious stimulation on the left side than the right  Deep Tendon Reflexes:  2+ and symmetric in the biceps and patellae.  Cerebellar:  Unable to obtain due to altered mental status.  Gait:  Unable to obtain due to altered mental status.  ASSESSMENT/PLAN  [name] is a 80 y.o. male with history of dementia presenting with left sided weakness. He did not  receive IV t-PA due to delay in arrival. MRI imaging confirms a right ICA infarct.   StrokeTIA:  right ICA infarcts, thromboembolic secondary to R ICA occlusion      MRI  RIGHT ICA infarcts. 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   Carotid Doppler  R 60-79% stenosis  2D Echo  pending   no antithrombotics prior to admission, now on aspirin suppository 300 mg. Will likely recommend aspirin and plavix together x 3 months then one alone - will discuss further tomorrow.  Heparin 5000 units sq tid for VTE prophylaxis  NPO   Resultant left hemiplegia, dysphagia  Therapy recommendations:  pending   Ongoing aggressive risk factor management  Risk factor  education to family  DNR recommended with continuation of appropriate care. Dr. Leonie Man discussed with daughter who plans to discuss with siblings.  Disposition:  pending   Hypertension  Home meds:   Lopressor, imdur  Permissive hypertension <220/120 for 24-48 hours and then gradually normalize within 5-7 days  BP goal long term normotensive  Hyperlipidemia  Home meds:  none  LDL 160  On lipitor 20 now  Continue statin at discharge  Diabetes  HgbA1c pending, Goal < 7.0  Other Stroke Risk Factors Advanced age   Cachectic, Obesity, Body mass index is 18.52 kg/(m^2).    Hx stroke/TIA Coronary artery disease-CABG 1997 Old SDH stable  Other Active Problems  Elevated troponins, likely secondary to stroke.   UTI on rocephin  CKD stage 3   Hospital day # 1  [facility] BIBY, MSN, RN, ANVP-BC, ANP-BC, [facility] Pager: [phone] 04/02/2014 12:30 PM  I have personally examined this patient, reviewed notes, independently viewed imaging studies, participated in medical decision making and plan of care. I have made any additions or clarifications directly to the above note. Agree with note above.   Antony Contras, MD Medical Director [place] Pager:  [phone] 04/02/2014 9:23 PM    To contact Stroke Continuity provider, please refer to [email or web address] After hours, contact General Neurology

## 2014-04-02 NOTE — Progress Notes (Signed)
Pt confused and does not follow command , left side weakness noted , pt 3-4/5 for RUE motor strength . Left  UEside flaccid  . Unable to complete admission history, no family member at bedside.

## 2014-04-02 NOTE — Evaluation (Signed)
Clinical/Bedside Swallow Evaluation Patient Details  Name: [name] MRN: [id number] Date of Birth: Sep 04, 1927  Today's Date: 04/02/2014 Time: 1300-1319 SLP Time Calculation (min): 19 min  Past Medical History:  Past Medical History  Diagnosis Date  . Coronary artery disease     post CABG in 1997 with abnormal nuclear stress test in 2007 which did demonstrate mild reversible ischemia and has been managed medically.   . Diabetes mellitus     type 2  . Hypertension   . Hyperlipidemia   . Hypercholesterolemia   . Atypical chest pain   . Hypernatremia   . History of hypoglycemia   . Syncope   . CVA (cerebral infarction)   . Allergy   . History of chicken pox   . Acute on chronic renal insufficiency   . CKD (chronic kidney disease)   . GERD (gastroesophageal reflux disease)   . H/O hiatal hernia    Past Surgical History:  Past Surgical History  Procedure Laterality Date  . Coronary artery bypass graft  1997    x3 -- patent internal mammary graft to saphenous vein grafts x3  . Tonsillectomy    . Cardiac catheterization  01/26/1999    patent internal mammary graft to saphenous vein grafts x3 -- singnificant three-vessel coroanry artery disease -- potential sites of ischemia involving the intermediate branch, circumflex branch and the distal LAD through diffuse disease, as well as steal type syndrome -- normal LV function -- W. Doristine Church., M.D.    . Esophagogastroduodenoscopy  07/22/2012    Procedure: ESOPHAGOGASTRODUODENOSCOPY (EGD);  Surgeon: Beryle Beams, MD;  Location: [facility];  Service: Endoscopy;  Laterality: N/A;  . Colonoscopy  07/22/2012    Procedure: COLONOSCOPY;  Surgeon: Beryle Beams, MD;  Location: [facility];  Service: Endoscopy;  Laterality: N/A;   HPI:  80 y.o. male with past medical history of dementia, CVA, CABG, chonic kidney disease, GERD, hiatal hernia, HTN, acute renal insufficiency, ischemic heart disease and history of SDH. Pt. admitted  with left-sided weakness and MRI found multiple areas of restricted diffusion affecting the RIGHT anterior circulation consistent with acute infarction. Slight increased size of the LEFT frontotemporoparietal subdural hygroma.  CXR is pending.   Assessment / Plan / Recommendation Clinical Impression  No overt indications laryngeal penetration or aspiration present although increased risk due to pt. confusion/dementia (dependent feeder?) and acute infarct.  Delayed oral prep and transit (does not wear dentures while eating).  Recommend Dys 3 texture/thin liquids, pills whole in applesauce or crush if pt. pockets pills, full supervision, self feed if pt. able, small sips, no straws.  SLP will follow for safety and efficiency.     Aspiration Risk   (mod-severe)    Diet Recommendation Dysphagia 3 (Mechanical Soft);Thin liquid   Liquid Administration via: Cup;No straw Medication Administration: Whole meds with liquid (if pockets, crush pills) Supervision: Patient able to self feed;Full supervision/cueing for compensatory strategies;Staff to assist with self feeding Compensations: Slow rate;Small sips/bites Postural Changes and/or Swallow Maneuvers: Seated upright 90 degrees;Upright 30-60 min after meal    Other  Recommendations Oral Care Recommendations: Oral care BID   Follow Up Recommendations   (TBD)    Frequency and Duration min 2x/week  2 weeks   Pertinent Vitals/Pain No evidence pain         Swallow Study         Oral/Motor/Sensory Function Overall Oral Motor/Sensory Function:  (unable to fully assess due to dementia)   Ice Chips Ice chips: Not  tested   Thin Liquid Thin Liquid: Impaired Pharyngeal  Phase Impairments: Multiple swallows    Nectar Thick Nectar Thick Liquid: Not tested   Honey Thick Honey Thick Liquid: Not tested   Puree Puree: Impaired Presentation: Spoon Pharyngeal Phase Impairments: Suspected delayed Swallow   Solid   GO    Solid: Impaired Oral Phase  Impairments: Reduced lingual movement/coordination;Impaired anterior to posterior transit Oral Phase Functional Implications:  (delayed transit)       Alyzza Andringa, Orbie Pyo 04/02/2014,1:37 PM  Orbie Pyo [place].[employer] [phone]

## 2014-04-03 LAB — CBC
HEMATOCRIT: 37.1 % — AB (ref 39.0–52.0)
HEMOGLOBIN: 12.4 g/dL — AB (ref 13.0–17.0)
MCH: 30.6 pg (ref 26.0–34.0)
MCHC: 33.4 g/dL (ref 30.0–36.0)
MCV: 91.6 fL (ref 78.0–100.0)
Platelets: 260 10*3/uL (ref 150–400)
RBC: 4.05 MIL/uL — ABNORMAL LOW (ref 4.22–5.81)
RDW: 13.2 % (ref 11.5–15.5)
WBC: 5.9 10*3/uL (ref 4.0–10.5)

## 2014-04-03 LAB — BASIC METABOLIC PANEL
Anion gap: 11 (ref 5–15)
BUN: 29 mg/dL — AB (ref 6–23)
CALCIUM: 9.2 mg/dL (ref 8.4–10.5)
CO2: 26 mEq/L (ref 19–32)
CREATININE: 2.49 mg/dL — AB (ref 0.50–1.35)
Chloride: 108 mEq/L (ref 96–112)
GFR calc Af Amer: 26 mL/min — ABNORMAL LOW (ref >90)
GFR, EST NON AFRICAN AMERICAN: 22 mL/min — AB (ref >90)
GLUCOSE: 138 mg/dL — AB (ref 70–99)
POTASSIUM: 4.2 meq/L (ref 3.7–5.3)
Sodium: 145 mEq/L (ref 137–147)

## 2014-04-03 LAB — GLUCOSE, CAPILLARY
Glucose-Capillary: 108 mg/dL — ABNORMAL HIGH (ref 70–99)
Glucose-Capillary: 136 mg/dL — ABNORMAL HIGH (ref 70–99)
Glucose-Capillary: 138 mg/dL — ABNORMAL HIGH (ref 70–99)
Glucose-Capillary: 162 mg/dL — ABNORMAL HIGH (ref 70–99)
Glucose-Capillary: 177 mg/dL — ABNORMAL HIGH (ref 70–99)
Glucose-Capillary: 94 mg/dL (ref 70–99)

## 2014-04-03 MED ORDER — CLOPIDOGREL BISULFATE 75 MG PO TABS
75.0000 mg | ORAL_TABLET | Freq: Every day | ORAL | Status: DC
  Administered 2014-04-04 – 2014-04-05 (×2): 75 mg via ORAL
  Filled 2014-04-03 (×2): qty 1

## 2014-04-03 MED ORDER — METOPROLOL TARTRATE 12.5 MG HALF TABLET
12.5000 mg | ORAL_TABLET | Freq: Two times a day (BID) | ORAL | Status: DC
  Administered 2014-04-03: 12.5 mg via ORAL
  Filled 2014-04-03 (×2): qty 1

## 2014-04-03 NOTE — H&P (Addendum)
[facility] PROGRESS NOTE  [name] DEY:[id number] DOB: [birth year] DOA: 04/01/2014 PCP: Webb Silversmith, NP  Assessment/Plan: Acute Stroke: -MRI; Multiple areas of restricted diffusion affecting the RIGHT anterior circulation consistent with acute infarction. -Patient admitted to the hospital for further evaluation.  -CT scan done in the ED and showed no evidence of acute events.  -LDL 160, will start statins.   UTI  -Patient started empirically on Rocephin, urinalysis consistent with UTI.  -urine culture pending.  -Continue with ceftriaxone.   Bradycardia; will hold metoprolol.   Elevated troponin  - continue aspirin, statin.  -Patient has old subdural hematoma, no change since last year, I think aspirin would be safe.   -Likely no intervention as this probably might be secondary to his CVA or elevated creatinine,  -troponin elevated. ECHO normal EF, and no wall motion abnormalities.  -Elevation of troponin related to demand ischemia in setting stroke, renal failure.   Diabetes mellitus  -hold glipizide.  -Started on insulin sliding scale.   CKD stage III  -Creatinine is 2.38, this is around his baseline. Monitor while in the hospital.  -stable.   SDH  -Has chronic right-sided SDH, no mass effect, no changes since about a year ago.  -No active bleeding, aspirin started. Continue with aspirin if ok with neurology   Code Status: Full Code.  Family Communication: multiple family member at bedside.  Disposition Plan: remain inpatient.    Consultants:  nuerology  Procedures: ECHO; pending Doppler; Right: 60-79% internal carotid artery stenosis. Left: 1-39% ICA stenosis. Bilateral: Vertebral artery flow is antegrade.     Antibiotics:  none  HPI/Subjective: No chest pain, say few words.    Objective: Filed Vitals:   04/03/14 0848  BP: 137/64  Pulse: 66  Temp: 98.2 F (36.8 C)  Resp: 16    Intake/Output Summary (Last 24 hours) at 04/03/14  1130 Last data filed at 04/03/14 0803  Gross per 24 hour  Intake    120 ml  Output    750 ml  Net   -630 ml   Filed Weights   04/01/14 1923 04/02/14 0300 04/03/14 0400  Weight: 63.866 kg (140 lb 12.8 oz) 63.655 kg (140 lb 5.3 oz) 63.912 kg (140 lb 14.4 oz)    Exam:   General:  Alert in no distress.   Cardiovascular: S 1, S 2 RRR  Respiratory: few ronchus.   Abdomen: bs present, soft, nt  Musculoskeletal: trace edema.   Neuro; alert answer yes and no. Left side with weakness, and muscle contracture.   Data Reviewed: Basic Metabolic Panel:  Recent Labs Lab 04/01/14 1541 04/01/14 1554 04/02/14 0249 04/03/14 0900  NA 144 144 143 145  K 4.7 4.4 4.5 4.2  CL 107 110 108 108  CO2 22  --  23 26  GLUCOSE 171* 174* 137* 138*  BUN 31* 31* 32* 29*  CREATININE 2.38* 2.50* 2.42* 2.49*  CALCIUM 9.1  --  9.3 9.2   Liver Function Tests:  Recent Labs Lab 04/01/14 1541  AST 15  ALT 6  ALKPHOS 61  BILITOT 0.4  PROT 8.1  ALBUMIN 3.6   No results found for this basename: LIPASE, AMYLASE,  in the last 168 hours No results found for this basename: AMMONIA,  in the last 168 hours CBC:  Recent Labs Lab 04/01/14 1541 04/01/14 1554 04/02/14 0249 04/03/14 0558  WBC 7.1  --  7.3 5.9  NEUTROABS 5.8  --   --   --   HGB 11.3* 12.9* 10.8*  12.4*  HCT 33.3* 38.0* 31.8* 37.1*  MCV 92.0  --  89.3 91.6  PLT 257  --  258 260   Cardiac Enzymes:  Recent Labs Lab 04/01/14 1540 04/01/14 2145 04/02/14 0249 04/02/14 1418  TROPONINI 0.82* 1.56* 1.89* 1.43*   BNP (last 3 results) No results found for this basename: PROBNP,  in the last 8760 hours CBG:  Recent Labs Lab 04/02/14 1618 04/02/14 2122 04/03/14 0024 04/03/14 0415 04/03/14 0747  GLUCAP 118* 162* 162* 108* 94    No results found for this or any previous visit (from the past 240 hour(s)).   Studies: Dg Chest 2 View  04/02/2014   CLINICAL DATA:  Stroke, patient unresponsive  EXAM: CHEST  2 VIEW  COMPARISON:   03/11/2013, 06/08/2012  FINDINGS: The heart size and vascular pattern are normal. Patient is status post previous CABG. No consolidation or effusion. 5 mm right upper lobe nodular opacity, not seen on prior study.  IMPRESSION: No acute cardiopulmonary process. 5 mm right upper lobe nodular opacity. This can be re-evaluated on anticipated followup chest radiographs. Alternatively a CT thorax could be considered.   Electronically Signed   By: Skipper Cliche M.D.   On: 04/02/2014 21:21   Ct Head Wo Contrast  04/01/2014   CLINICAL DATA:  80 year old male with left-sided weakness and pain.  EXAM: CT HEAD WITHOUT CONTRAST  TECHNIQUE: Contiguous axial images were obtained from the base of the skull through the vertex without intravenous contrast.  COMPARISON:  Head CT 03/30/2013, 03/11/2013.  MRI 03/15/2013  FINDINGS: Unremarkable appearance of the calvarium with no acute fracture or aggressive lesion.  No paranasal sinus disease.  Mastoid air cells are clear.  Unremarkable appearance of the bilateral orbits.  Re- demonstration of heterogeneous high right parietal subdural collection without associated mass effect. This is essentially unchanged in greatest thickness and distribution as compared to prior CT and MRI.  Extra-axial collection overlying the left frontal region measures slightly greater as compared to the prior CT, previously 16 mm, and now 19 mm. Persistent mild mass effect on the underlying brain. Unchanged appearance of right frontal CSF density collection.  No region of new or acute intracranial hemorrhage.  New region of hypodensity in the left temporoparietal region extending to the Freehling matter.  Re- demonstration of encephalomalacic changes of the right temporoparietal region.  Unchanged configuration of the ventricular system.  Similar appearance of periventricular white matter changes, particularly overlying the left frontal horn, as well as the right hemisphere.  Dense intracranial atherosclerotic  calcifications.  IMPRESSION: No new or acute hemorrhage. There is similar appearance of right parietal subdural collection (present on study of 1 year prior) without associated mass effect.  The left frontal hygroma measures slightly greater than on the comparison CT, previously 16 mm and now 19 mm. Configuration of the underlying brain is similar to prior. Also, similar appearance of right frontal hygroma.  New region of encephalomalacia of left temporal parietal brain, which appears chronic/remote. If there was concern for acute component, MR may be indicated.  Intracranial atherosclerosis.  Signed,  Dulcy Fanny. Earleen Newport, DO  [facility]  [facility]   Electronically Signed   By: Corrie Mckusick D.O.   On: 04/01/2014 15:10   Mr Angiogram Neck Wo Contrast  04/02/2014   CLINICAL DATA:  New onset of LEFT-sided weakness. Exact time of onset is uncertain, but likely greater than 24 hr duration. Chronic extra-axial fluid collections are reported. Stroke risk factors include diabetes, hypertension,  hyperlipidemia, and history of previous stroke.  EXAM: MRI HEAD WITHOUT CONTRAST  MRA HEAD WITHOUT CONTRAST  MRA NECK WITHOUT CONTRAST  TECHNIQUE: Multiplanar, multiecho pulse sequences of the brain and surrounding structures were obtained without intravenous contrast. Angiographic images of the Circle of Willis were obtained using MRA technique without intravenous contrast. Angiographic images of the neck were obtained using MRA technique without intravenous contrast. Carotid stenosis measurements (when applicable) are obtained utilizing NASCET criteria, using the distal internal carotid diameter as the denominator.  COMPARISON:  CT head 04/01/2014. MR head 03/15/2013. MRI and MRA 12/06/2011  FINDINGS: MRI HEAD FINDINGS  The patient was unable to remain motionless for the exam. Small or subtle lesions could be overlooked. Overall exam diagnostic.  Areas of gyriform restricted  diffusion affect the RIGHT frontal and RIGHT posterior frontal cortex in the parasagittal location, the consistent with acute infarction. Single focus of restricted diffusion more posteriorly adjacent to the old hemorrhagic infarct. No other areas of restricted diffusion.  BILATERAL areas of cortical infarction, some which display chronic hemorrhage appears stable. Chronic of LEFT frontotemporoparietal subdural hygroma, CSF intensity on all pulse sequences, measures up to 19 mm, with moderate mass effect on the LEFT hemisphere, slightly increased in 2014. There is a much smaller RIGHT parietal collection, more complex, up to 7 mm thick without significant mass effect, likely representing combination of hygroma and chronic subdural hematoma, essentially stable from 2014.  Flow voids are maintained. No midline abnormality. No acute sinus or mastoid disease.  MRA HEAD FINDINGS  Widely patent internal carotid artery on the LEFT. 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 Proximal flow reducing lesion is discussed in the MRA Neck section. There is suspected 50% stenosis of the cavernous and supraclinoid ICA on the RIGHT, non flow reducing. Azygos LEFT ACA supplies both anterior cerebrals. No MCA stenosis or branch occlusion. Basilar artery widely patent with LEFT vertebral dominant. Mildly diseased V4 segment distal RIGHT vertebral. No cerebellar branch occlusion. Mild irregularity both proximal posterior cerebral arteries.  MRA NECK FINDINGS  No priors for comparison. There is suspected flow reducing stenosis of the RIGHT ICA, estimated 75-90%. Distal right common carotid artery plaque narrows that vessel without definite ulceration. Unremarkable appearing LEFT carotid bifurcation. Both vertebrals are patent, with the LEFT dominant.  IMPRESSION: Multiple areas of restricted  diffusion affecting the RIGHT anterior circulation consistent with acute infarction.  Slight increased size of the LEFT frontotemporoparietal subdural hygroma, now 19 mm thick. Stable chronic mixed signal intensity RIGHT hemisphere extra-axial collection.  Diminished caliber of the RIGHT ICA compared to the LEFT, progressive since 2013, relates to an apparent flow reducing 75-90% stenosis at the RICA origin.   Electronically Signed   By: Rolla Flatten M.D.   On: 04/02/2014 11:33   [name]  04/02/2014   CLINICAL DATA:  New onset of LEFT-sided weakness. Exact time of onset is uncertain, but likely greater than 24 hr duration. Chronic extra-axial fluid collections are reported. Stroke risk factors include diabetes, hypertension, hyperlipidemia, and history of previous stroke.  EXAM: MRI HEAD WITHOUT CONTRAST  MRA HEAD WITHOUT CONTRAST  MRA NECK WITHOUT CONTRAST  TECHNIQUE: Multiplanar, multiecho pulse sequences of the brain and surrounding structures were obtained without intravenous contrast. Angiographic images of the Circle of Willis were obtained using MRA technique without intravenous contrast. Angiographic images of the neck were obtained using  MRA technique without intravenous contrast. Carotid stenosis measurements (when applicable) are obtained utilizing NASCET criteria, using the distal internal carotid diameter as the denominator.  COMPARISON:  CT head 04/01/2014. MR head 03/15/2013. MRI and MRA 12/06/2011  FINDINGS: MRI HEAD FINDINGS  The patient was unable to remain motionless for the exam. Small or subtle lesions could be overlooked. Overall exam diagnostic.  Areas of gyriform restricted diffusion affect the RIGHT frontal and RIGHT posterior frontal cortex in the parasagittal location, the consistent with acute infarction. Single focus of restricted diffusion more posteriorly adjacent to the old hemorrhagic infarct. No other areas of restricted diffusion.  BILATERAL areas of cortical  infarction, some which display chronic hemorrhage appears stable. Chronic of LEFT frontotemporoparietal subdural hygroma, CSF intensity on all pulse sequences, measures up to 19 mm, with moderate mass effect on the LEFT hemisphere, slightly increased in 2014. There is a much smaller RIGHT parietal collection, more complex, up to 7 mm thick without significant mass effect, likely representing combination of hygroma and chronic subdural hematoma, essentially stable from 2014.  Flow voids are maintained. No midline abnormality. No acute sinus or mastoid disease.  MRA HEAD FINDINGS  Widely patent internal carotid artery on the LEFT. 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 Proximal flow reducing lesion is discussed in the MRA Neck section. There is suspected 50% stenosis of the cavernous and supraclinoid ICA on the RIGHT, non flow reducing. Azygos LEFT ACA supplies both anterior cerebrals. No MCA stenosis or branch occlusion. Basilar artery widely patent with LEFT vertebral dominant. Mildly diseased V4 segment distal RIGHT vertebral. No cerebellar branch occlusion. Mild irregularity both proximal posterior cerebral arteries.  MRA NECK FINDINGS  No priors for comparison. There is suspected flow reducing stenosis of the RIGHT ICA, estimated 75-90%. Distal right common carotid artery plaque narrows that vessel without definite ulceration. Unremarkable appearing LEFT carotid bifurcation. Both vertebrals are patent, with the LEFT dominant.  IMPRESSION: Multiple areas of restricted diffusion affecting the RIGHT anterior circulation consistent with acute infarction.  Slight increased size of the LEFT frontotemporoparietal subdural hygroma, now 19 mm thick. Stable chronic mixed signal intensity RIGHT hemisphere extra-axial collection.  Diminished caliber of the RIGHT ICA compared  to the LEFT, progressive since 2013, relates to an apparent flow reducing 75-90% stenosis at the RICA origin.   Electronically Signed   By: Rolla Flatten M.D.   On: 04/02/2014 11:33   Mr Jodene Nam Head/brain Wo Cm  04/02/2014   CLINICAL DATA:  New onset of LEFT-sided weakness. Exact time of onset is uncertain, but likely greater than 24 hr duration. Chronic extra-axial fluid collections are reported. Stroke risk factors include diabetes, hypertension, hyperlipidemia, and history of previous stroke.  EXAM: MRI HEAD WITHOUT CONTRAST  MRA HEAD WITHOUT CONTRAST  MRA NECK WITHOUT CONTRAST  TECHNIQUE: Multiplanar, multiecho pulse sequences of the brain and surrounding structures were obtained without intravenous contrast. Angiographic images of the Circle of Willis were obtained using MRA technique without intravenous contrast. Angiographic images of the neck were obtained using MRA technique without intravenous contrast. Carotid stenosis measurements (when applicable) are obtained utilizing NASCET criteria, using the distal internal carotid diameter as the denominator.  COMPARISON:  CT head 04/01/2014. MR head 03/15/2013. MRI and MRA 12/06/2011  FINDINGS: MRI HEAD FINDINGS  The patient was unable to remain motionless for the exam. Small or subtle lesions could be overlooked. Overall  exam diagnostic.  Areas of gyriform restricted diffusion affect the RIGHT frontal and RIGHT posterior frontal cortex in the parasagittal location, the consistent with acute infarction. Single focus of restricted diffusion more posteriorly adjacent to the old hemorrhagic infarct. No other areas of restricted diffusion.  BILATERAL areas of cortical infarction, some which display chronic hemorrhage appears stable. Chronic of LEFT frontotemporoparietal subdural hygroma, CSF intensity on all pulse sequences, measures up to 19 mm, with moderate mass effect on the LEFT hemisphere, slightly increased in 2014. There is a much smaller RIGHT parietal  collection, more complex, up to 7 mm thick without significant mass effect, likely representing combination of hygroma and chronic subdural hematoma, essentially stable from 2014.  Flow voids are maintained. No midline abnormality. No acute sinus or mastoid disease.  MRA HEAD FINDINGS  Widely patent internal carotid artery on the LEFT. 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 Proximal flow reducing lesion is discussed in the MRA Neck section. There is suspected 50% stenosis of the cavernous and supraclinoid ICA on the RIGHT, non flow reducing. Azygos LEFT ACA supplies both anterior cerebrals. No MCA stenosis or branch occlusion. Basilar artery widely patent with LEFT vertebral dominant. Mildly diseased V4 segment distal RIGHT vertebral. No cerebellar branch occlusion. Mild irregularity both proximal posterior cerebral arteries.  MRA NECK FINDINGS  No priors for comparison. There is suspected flow reducing stenosis of the RIGHT ICA, estimated 75-90%. Distal right common carotid artery plaque narrows that vessel without definite ulceration. Unremarkable appearing LEFT carotid bifurcation. Both vertebrals are patent, with the LEFT dominant.  IMPRESSION: Multiple areas of restricted diffusion affecting the RIGHT anterior circulation consistent with acute infarction.  Slight increased size of the LEFT frontotemporoparietal subdural hygroma, now 19 mm thick. Stable chronic mixed signal intensity RIGHT hemisphere extra-axial collection.  Diminished caliber of the RIGHT ICA compared to the LEFT, progressive since 2013, relates to an apparent flow reducing 75-90% stenosis at the RICA origin.   Electronically Signed   By: Rolla Flatten M.D.   On: 04/02/2014 11:33    Scheduled Meds: . aspirin  300 mg Rectal Daily   Or  . aspirin  325 mg Oral Daily  . atorvastatin  20 mg Oral  q1800  . cefTRIAXone (ROCEPHIN)  IV  1 g Intravenous Q24H  . Influenza vac split quadrivalent PF  0.5 mL Intramuscular Tomorrow-1000  . insulin aspart  0-9 Units Subcutaneous TID WC  . isosorbide mononitrate  30 mg Oral Daily  . pantoprazole  40 mg Oral Daily   Continuous Infusions:   Principal Problem:   Acute left-sided weakness Active Problems:   Ischemic heart disease   Diabetes mellitus type 2, insulin dependent   CKD (chronic kidney disease)   HTN (hypertension)   SDH (subdural hematoma)   UTI (lower urinary tract infection)   Elevated troponin   Cerebral thrombosis with cerebral infarction    Time spent: 25 minutes.     Niel Hummer A  [facility] Pager [phone]. If 7PM-7AM, please contact night-coverage at [email or web address], password [facility] 04/03/2014, 11:30 AM  LOS: 2 days

## 2014-04-03 NOTE — Progress Notes (Signed)
Patient had 2.58sec pause, NP M.Lynch made aware, no new orders given. Will continue to monitor patient.

## 2014-04-03 NOTE — Progress Notes (Signed)
STROKE TEAM PROGRESS NOTE   HISTORY [name] is a 80 y.o. male with a history of dementia he was apparently able to get up and move around at baseline who presents today with new left-sided 2weakness. It is unclear exactly when he was last well but it has been at least since yesterday.  He was brought into the emergency room where a CT of his head shows bilateral posterior quadrant infarcts which I feel are most consistent with bilateral posterior watershed chronic infarcts.  Patient was not administered TPA secondary to delay in arrival. He was admitted for further evaluation and treatment.   SUBJECTIVE (INTERVAL HISTORY) No family at bedside. Patient up in chair at bedside. Given baseline dementia, not a surgical candidate. Overall, looks more alert and is more interactive this am. Neurologically stable. Therapy and rehabilitation consults pending   OBJECTIVE Temp:  [97.7 F (36.5 C)-98.2 F (36.8 C)] 98.2 F (36.8 C) (10/04 0848) Pulse Rate:  [52-77] 66 (10/04 0848) Cardiac Rhythm:  [-] Normal sinus rhythm;Sinus bradycardia (10/03 2100) Resp:  [16-20] 16 (10/04 0848) BP: (126-163)/(60-94) 137/64 mmHg (10/04 0848) SpO2:  [100 %] 100 % (10/04 0848) Weight:  [63.912 kg (140 lb 14.4 oz)] 63.912 kg (140 lb 14.4 oz) (10/04 0400)   Recent Labs Lab 04/02/14 1618 04/02/14 2122 04/03/14 0024 04/03/14 0415 04/03/14 0747  GLUCAP 118* 162* 162* 108* 94    Recent Labs Lab 04/01/14 1541 04/01/14 1554 04/02/14 0249 04/03/14 0900  NA 144 144 143 145  K 4.7 4.4 4.5 4.2  CL 107 110 108 108  CO2 22  --  23 26  GLUCOSE 171* 174* 137* 138*  BUN 31* 31* 32* 29*  CREATININE 2.38* 2.50* 2.42* 2.49*  CALCIUM 9.1  --  9.3 9.2    Recent Labs Lab 04/01/14 1541  AST 15  ALT 6  ALKPHOS 61  BILITOT 0.4  PROT 8.1  ALBUMIN 3.6    Recent Labs Lab 04/01/14 1541 04/01/14 1554 04/02/14 0249 04/03/14 0558  WBC 7.1  --  7.3 5.9  NEUTROABS 5.8  --   --   --   HGB 11.3* 12.9* 10.8*  12.4*  HCT 33.3* 38.0* 31.8* 37.1*  MCV 92.0  --  89.3 91.6  PLT 257  --  258 260    Recent Labs Lab 04/01/14 1540 04/01/14 2145 04/02/14 0249 04/02/14 1418  TROPONINI 0.82* 1.56* 1.89* 1.43*    Recent Labs  04/01/14 1541  LABPROT 14.3  INR 1.11    Recent Labs  04/01/14 1600  COLORURINE YELLOW  LABSPEC 1.010  PHURINE 5.0  GLUCOSEU NEGATIVE  HGBUR LARGE*  BILIRUBINUR NEGATIVE  KETONESUR NEGATIVE  PROTEINUR 100*  UROBILINOGEN 0.2  NITRITE NEGATIVE  LEUKOCYTESUR MODERATE*       Component Value Date/Time   CHOL 208* 04/02/2014 0259   TRIG 88 04/02/2014 0259   HDL 30* 04/02/2014 0259   CHOLHDL 6.9 04/02/2014 0259   VLDL 18 04/02/2014 0259   LDLCALC 160* 04/02/2014 0259   Lab Results  Component Value Date   HGBA1C 5.4 04/02/2014      Component Value Date/Time   LABOPIA NONE DETECTED 04/01/2014 1600   COCAINSCRNUR NONE DETECTED 04/01/2014 1600   LABBENZ NONE DETECTED 04/01/2014 1600   AMPHETMU NONE DETECTED 04/01/2014 1600   THCU NONE DETECTED 04/01/2014 1600   LABBARB NONE DETECTED 04/01/2014 1600     Recent Labs Lab 04/01/14 1541  ETH <11    Ct Head Wo Contrast 04/01/2014   No new or  acute hemorrhage. There is similar appearance of right parietal subdural collection (present on study of 1 year prior) without associated mass effect.  The left frontal hygroma measures slightly greater than on the comparison CT, previously 16 mm and now 19 mm. Configuration of the underlying brain is similar to prior. Also, similar appearance of right frontal hygroma.  New region of encephalomalacia of left temporal parietal brain, which appears chronic/remote. If there was concern for acute component, MR may be indicated.  Intracranial atherosclerosis.    [name] 04/02/2014    Multiple areas of restricted diffusion affecting the RIGHT anterior circulation consistent with acute infarction.  Slight increased size of the LEFT frontotemporoparietal subdural hygroma, now 19 mm  thick. Stable chronic mixed signal intensity RIGHT hemisphere extra-axial collection.   [name]  04/02/2014 Diminished caliber of the RIGHT ICA compared to the LEFT, progressive since 2013, relates to an apparent flow reducing 75-90% stenosis at the RICA origin.     Carotid duplex Right: 60-79% internal carotid artery stenosis. Left: 1-39% ICA stenosis. Bilateral: Vertebral artery flow is antegrade.   2D Echocardiogram  EF normal with no source of embolus. Mild MR/   PHYSICAL EXAM Elderly male not in distress.Awake alert. Afebrile. Head is nontraumatic. Neck is supple without bruit. Hearing is normal. Cardiac exam no murmur or gallop. Lungs are clear to auscultation. Distal pulses are well felt. Mental Status:  Patient is awake, alert, he is able to tell me his name and follow simple commands. He is unable to answer any even slightly complex questions.  He appears to not be attending well to left side.  Cranial Nerves:  II: Pleased to threat from the right, but not from the left Pupils are equal, round, and reactive to light. Discs are difficult to visualize.  III,IV, VI: EOMI without ptosis or diploplia.  V: Facial sensation is symmetric to temperature  VII: Facial movement is notable for flattening of the left nasolabial fold  VIII: hearing is intact to voice  X: Uvula elevates symmetrically  XI: Shoulder shrug is symmetric.  XII: tongue is midline without atrophy or fasciculations.  Motor:  Tone is normal. Bulk is normal. He moves his right side well, but is not moving his left side is much. With repositioning, he does have some strength but I suspect that he is neglecting it. I suspect that he does have weakness in it as well.  Sensory:  He responds less to noxious stimulation on the left side than the right  Deep Tendon Reflexes:  2+ and symmetric in the biceps and patellae.  Cerebellar:  Unable to obtain due to altered mental status.  Gait:  Unable to obtain due to  altered mental status.  ASSESSMENT/PLAN [name] is a 80 y.o. male with history of dementia presenting with left sided weakness. He did not receive IV t-PA due to delay in arrival. MRI imaging confirms a right ICA infarct.   StrokeTIA:  New right ICA infarct adjacent to old R MCA infarcts, thromboembolic secondary to R ICA occlusion. Not a surgical candidate due to baseline dementia      MRI  RIGHT ICA infarcts. 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   Carotid Doppler  R 60-79% stenosis  2D Echo  No source of embolus  no antithrombotics prior to admission, given history of SDH, will change to clopidogrel 75 mg orally every day instead of dual antiplatelets.   Heparin 5000 units  sq tid for VTE prophylaxis  Dysphagia 3 thin liquids  Resultant left hemiplegia, dysphagia  Therapy recommendations:  CIR  Ongoing aggressive risk factor management  Risk factor education to family  DNR recommended with continuation of appropriate care. Dr. Leonie Man discussed with daughter who plans to discuss with siblings.  Disposition:  Pending, CIR consult placed No further stroke workup indicated. Patient has a 10-15% risk of having another stroke over the next year, the highest risk is within 2 weeks of the most recent stroke/TIA (risk of having a stroke following a stroke or TIA is the same). Ongoing risk factor control by Primary Care Physician Stroke Service will sign off. Please call should any needs arise. Follow up with Dr. Leonie Man, [facility], in 2 months.  Hypertension  Home meds:   Lopressor, imdur  Permissive hypertension <220/120 for 24-48 hours and then gradually normalize within 5-7 days  BP goal long term normotensive  Hyperlipidemia  Home meds:  none  LDL 160  On lipitor 20 now  Continue statin at discharge  Diabetes  HgbA1c 5.4, at Goal < 7.0  Other Stroke Risk Factors Advanced age   Cachectic, Obesity, Body mass index is 18.59 kg/(m^2).     Hx stroke/TIA Coronary artery disease-CABG 1997 Old SDH stable  Other Active Problems  Elevated troponins, likely secondary to stroke.   UTI on rocephin  CKD stage 3  Bradycardia    Hospital day # 2  Burnetta Sabin, MSN, RN, ANVP-BC, ANP-BC, [facility] Pager: [phone] 04/03/2014 11:24 AM   I have personally examined this patient, reviewed notes, independently viewed imaging studies, participated in medical decision making and plan of care. I have made any additions or clarifications directly to the above note. Agree with note above.   Antony Contras, MD Medical Director [place] Pager: [phone] 04/03/2014 2:43 PM    To contact Stroke Continuity provider, please refer to [email or web address] After hours, contact General Neurology

## 2014-04-03 NOTE — Progress Notes (Signed)
Occupational Therapy Evaluation Patient Details Name: [name] MRN: [id number] DOB: Jun 08, 1928 Today's Date: 04/03/2014    History of Present Illness [name] is a 80 y.o. male with past medical history of dementia, ischemic heart disease and history of SDH. Patient brought to the hospital because of left-sided weakness. CT of his head shows bilateral posterior quadrant infarcts which I feel are most consistent with bilateral posterior watershed chronic infarcts   Clinical Impression   PTA obtained through PT, who spoke with pt's family. At baseline, pt required assist for bathing and dressing from wife, however was able to self-feed. Pt was ambulatory around the house with rollator. Pt currently demonstrates L sided inattention and hemiplegia with LUE flaccid and non-functional. Pt also presents with impaired cognition, beyond baseline status, and apparent receptive aphasia. Pt would benefit from intensive CIR therapy to return to baseline status and enable pt to return home with 24/7 family support. Pt will benefit from acute OT to address functional use of LUE, self-ROM, cognition, and ADLs.     Follow Up Recommendations  CIR;Supervision/Assistance - 24 hour    Equipment Recommendations  Other (comment) (Defer to CIR)    Recommendations for Other Services       Precautions / Restrictions Precautions Precautions: Fall Precaution Comments: left hemiplegia, left inattention. right gaze preference, aphasia      Mobility Bed Mobility Overal bed mobility: Needs Assistance;+2 for physical assistance Bed Mobility: Rolling;Sidelying to Sit Rolling: Max assist Sidelying to sit: Max assist       General bed mobility comments: Pt sitting EOB with PT when OT arrived. Per PT, pt required max (A) for bed mobility.   Transfers Overall transfer level: Needs assistance Equipment used:  (2 person assisted gait belt transfer) Transfers: Sit to/from [facility] Sit to  Stand: Max assist;+2 physical assistance Stand pivot transfers: Total assist;+2 physical assistance       General transfer comment: LLE blocked throughout with use of pad and belt to stand pt with 2 person assist max cues and assist. With pivot total assist to rotate pelvis and pt not advancing feet. Pt maintaining flexed posture throughout despite assist for trunk extension and cues    Balance Overall balance assessment: Needs assistance   Sitting balance-Leahy Scale: Poor Sitting balance - Comments: pt with posterior left lean in sitting with bil hands in lap. With right hand on surface will push left. with right hand on rail will pull fully right Postural control: Posterior lean;Left lateral lean   Standing balance-Leahy Scale: Zero Standing balance comment: in standing maintaining flexion and anterior lean                            ADL Overall ADL's : Needs assistance/impaired     Grooming: Sitting;Moderate assistance Grooming Details (indicate cue type and reason): Hand over hand to initiate task of rubbing lotion onto his LUE. Pt able to continue and complete task without perseveration.  Upper Body Bathing: Sitting;Maximal assistance   Lower Body Bathing: Total assistance;+2 for physical assistance;Sit to/from stand   Upper Body Dressing : Total assistance;Maximal assistance   Lower Body Dressing: Total assistance;+2 for physical assistance;Sit to/from stand   Toilet Transfer: Total assistance;+2 for physical assistance;Stand-pivot Toilet Transfer Details (indicate cue type and reason): Performed stand-pivot to chair to simulate [facility] transfer. However, Pt also required total assist to position hand held urinal while seated.  General ADL Comments: Pt presents with baseline cognitive impairment (dementia) however also feel that he demonstrates receptive aphasia contributing to difficulty following commands for ADL completion. Pt has R sided gaze  preference with Lt sided inattention. Pt requires tactile cues and hand over hand assist for basic grooming tasks.      Vision                 Additional Comments: Difficult to assess vision due to decreased cognition, receptive aphasia, difficulty following commands. To be further assessed.    Perception Perception Perception Tested?: No   Praxis Praxis Praxis tested?: Deficits Deficits: Initiation;Organization Praxis-Other Comments: Pt unable to close eyes on command, and feel that this is due to receptive aphasia rather than apraxia.     Pertinent Vitals/Pain Pain Assessment: No/denies pain     Hand Dominance Right   Extremity/Trunk Assessment Upper Extremity Assessment Upper Extremity Assessment: LUE deficits/detail LUE Deficits / Details: Lt arm essentially flaccid with trace movement in fingers. Pt appears to have receptive aphasia and difficulty following commands, which made it difficult to assess. LUE currently non functional.  LUE Sensation:  (Pt answered "yes" when asked if he could feel OT touching hi) LUE Coordination: decreased fine motor;decreased gross motor   Lower Extremity Assessment Lower Extremity Assessment: Defer to PT evaluation RLE Deficits / Details: grossly 2+/5 , pt able to bend knee and extend knee with tactile cueing LLE Deficits / Details: no active movement noted and no withdrawal to pain of LLE during eval LLE Coordination: decreased gross motor   Cervical / Trunk Assessment Cervical / Trunk Assessment: Kyphotic   Communication Communication Communication: HOH;Receptive difficulties   Cognition Arousal/Alertness: Awake/alert Behavior During Therapy: Flat affect Overall Cognitive Status: Impaired/Different from baseline Area of Impairment: Orientation;Attention;Memory;Following commands;Safety/judgement;Awareness;Problem solving Orientation Level: Disoriented to;Place;Time;Situation Current Attention Level: Sustained Memory:  Decreased short-term memory Following Commands: Follows one step commands inconsistently Safety/Judgement: Decreased awareness of deficits;Decreased awareness of safety Awareness: Intellectual Problem Solving: Slow processing;Decreased initiation;Difficulty sequencing;Requires tactile cues;Requires verbal cues General Comments: pt with baseline dementia with varied cognition at baseline with periods of being incoherent per family. Pt currently does not demonstrate awareness of current deficits with hemiplegia              Home Living Family/patient expects to be discharged to:: Private residence Living Arrangements: Spouse/significant other;Children Available Help at Discharge: Family;Available 24 hours/day Type of Home: House Home Access: Stairs to enter [employer] of Steps: 6 Entrance Stairs-Rails: Right;Left;Can reach both Home Layout: One level     Bathroom Shower/Tub: Teacher, early years/pre: Standard     Home Equipment: Grab bars - tub/shower;Walker - 4 wheels;Shower seat;Cane - single point;Hand held shower head   Additional Comments: Information obtained through PT, who spoke with family.      Prior Functioning/Environment Level of Independence: Needs assistance  Gait / Transfers Assistance Needed: pt normally walking in house alone, does not walk out of house ADL's / Homemaking Assistance Needed: Pt requires total assist with bathing and dressing. Pt is able to self-feed at baseline. Family does IADLs.    Comments: Information obtained by PT, who spoke with pt's family.     OT Diagnosis: Cognitive deficits;Hemiplegia non-dominant side   OT Problem List: Decreased strength;Decreased range of motion;Decreased activity tolerance;Impaired balance (sitting and/or standing);Decreased cognition;Decreased safety awareness;Decreased knowledge of use of DME or AE;Decreased knowledge of precautions;Impaired tone;Impaired UE functional use;Impaired  sensation   OT Treatment/Interventions: Self-care/ADL training;Therapeutic exercise;Energy conservation;DME and/or AE instruction;Patient/family education;Balance  training;Therapeutic activities;Splinting;Neuromuscular education;Cognitive remediation/compensation    OT Goals(Current goals can be found in the care plan section) Acute Rehab OT Goals Patient Stated Goal: None stated OT Goal Formulation: Patient unable to participate in goal setting Time For Goal Achievement: 04/24/14 Potential to Achieve Goals: Good ADL Goals Pt Will Perform Eating: with modified independence;with adaptive utensils;sitting Pt Will Perform Grooming: with set-up;with supervision;sitting Pt Will Perform Upper Body Bathing: with min assist;sitting Pt Will Transfer to Toilet: with min assist;stand pivot transfer;bedside commode Pt/caregiver will Perform Home Exercise Program: Increased ROM;Increased strength;Left upper extremity;With Supervision  OT Frequency: Min 3X/week           Co-evaluation PT/OT/SLP Co-Evaluation/Treatment: Yes Reason for Co-Treatment: Complexity of the patient's impairments (multi-system involvement);For patient/therapist safety PT goals addressed during session: Mobility/safety with mobility;Balance OT goals addressed during session: ADL's and self-care;Strengthening/ROM      End of Session Equipment Utilized During Treatment: Gait belt Nurse Communication: Mobility status;Need for lift equipment  Activity Tolerance: Patient tolerated treatment well Patient left: in chair;with call bell/phone within reach;with chair alarm set   Time: [phone] OT Time Calculation (min): 31 min Charges:  OT General Charges $OT Visit: 1 Procedure OT Evaluation $Initial OT Evaluation Tier I: 1 Procedure OT Treatments $Self Care/Home Management : 8-22 mins  Villa Herb M 04/03/2014, 12:32 PM  Cyndie Chime, OTR/L Occupational Therapist [phone] (pager)

## 2014-04-03 NOTE — Evaluation (Signed)
Physical Therapy Evaluation Patient Details Name: [name] MRN: [id number] DOB: 1927/09/07 Today's Date: 04/03/2014   History of Present Illness  Tanner Yeley is a 80 y.o. male with past medical history of dementia, ischemic heart disease and history of SDH. Patient brought to the hospital because of left-sided weakness. CT of his head shows bilateral posterior quadrant infarcts which I feel are most consistent with bilateral posterior watershed chronic infarcts  Clinical Impression  Pt pleasant smiling and waving on arrival. Pt able to state name and that he walked but very limited response to questions and commands throughout session today. Pt with right gaze preference but able to cross midline with max cueing. Pt with hemiplegia, impaired balance, cognition and function who will benefit from acute therapy to maximize strength, mobility, balance and function to decrease burden of care.     Follow Up Recommendations CIR;Supervision/Assistance - 24 hour    Equipment Recommendations  Wheelchair (measurements PT);Wheelchair cushion (measurements PT);Hospital bed    Recommendations for Other Services Rehab consult     Precautions / Restrictions Precautions Precautions: Fall Precaution Comments: left hemiplegia, left inattention. right gaze preference, aphasia      Mobility  Bed Mobility Overal bed mobility: Needs Assistance;+2 for physical assistance Bed Mobility: Rolling;Sidelying to Sit Rolling: Max assist Sidelying to sit: Max assist       General bed mobility comments: max assist to reach for rail, rotate pelvis, bring legs off bed and elevate trunk  Transfers Overall transfer level: Needs assistance   Transfers: Sit to/from Stand;Stand Pivot Transfers Sit to Stand: Max assist;+2 physical assistance Stand pivot transfers: Total assist;+2 physical assistance       General transfer comment: LLE blocked throughout with use of pad and belt to stand pt with 2 person assist  max cues and assist. With pivot total assist to rotate pelvis and pt not advancing feet. Pt maintaining flexed posture throughout despite assist for trunk extension and cues  Ambulation/Gait Ambulation/Gait assistance:  (pt unable)              Stairs            Wheelchair Mobility    Modified Rankin (Stroke Patients Only) Modified Rankin (Stroke Patients Only) Pre-Morbid Rankin Score: Moderate disability Modified Rankin: Severe disability     Balance Overall balance assessment: Needs assistance   Sitting balance-Leahy Scale: Poor Sitting balance - Comments: pt with posterior left lean in sitting with bil hands in lap. With right hand on surface will push left. with right hand on rail will pull fully right Postural control: Posterior lean;Left lateral lean   Standing balance-Leahy Scale: Zero Standing balance comment: in standing maintaining flexion and anterior lean                             Pertinent Vitals/Pain Pain Assessment: No/denies pain    Home Living Family/patient expects to be discharged to:: Private residence Living Arrangements: Spouse/significant other;Children Available Help at Discharge: Family;Available 24 hours/day Type of Home: House Home Access: Stairs to enter Entrance Stairs-Rails: Right;Left;Can reach both Entrance Stairs-Number of Steps: 6 Home Layout: One level Home Equipment: Grab bars - tub/shower;Walker - 4 wheels;Shower seat;Cane - single point;Hand held shower head      Prior Function Level of Independence: Needs assistance   Gait / Transfers Assistance Needed: pt normally walking in house alone, does not walk out of house  ADL's / Homemaking Assistance Needed: wife baths and dresses pt, self  feeds, family does all housework        Hand Dominance   Dominant Hand: Right    Extremity/Trunk Assessment   Upper Extremity Assessment: Defer to OT evaluation           Lower Extremity Assessment: RLE  deficits/detail;Difficult to assess due to impaired cognition;LLE deficits/detail RLE Deficits / Details: grossly 2+/5 , pt able to bend knee and extend knee with tactile cueing LLE Deficits / Details: no active movement noted and no withdrawal to pain of LLE during eval  Cervical / Trunk Assessment: Kyphotic  Communication   Communication: HOH;Receptive difficulties  Cognition Arousal/Alertness: Awake/alert Behavior During Therapy: Flat affect Overall Cognitive Status: Impaired/Different from baseline Area of Impairment: Orientation;Memory;Following commands;Safety/judgement;Awareness;Problem solving Orientation Level: Situation;Place;Time   Memory: Decreased short-term memory Following Commands: Follows one step commands inconsistently Safety/Judgement: Decreased awareness of deficits;Decreased awareness of safety   Problem Solving: Slow processing;Decreased initiation;Difficulty sequencing;Requires tactile cues General Comments: pt with baseline dementia with varied cognition at baseline with periods of being incoherent per family. Pt currently does not demonstrate awareness of current deficits with hemiplegia    General Comments      Exercises        Assessment/Plan    PT Assessment Patient needs continued PT services  PT Diagnosis Abnormality of gait;Hemiplegia non-dominant side;Altered mental status   PT Problem List Decreased strength;Decreased cognition;Impaired tone;Decreased range of motion;Decreased activity tolerance;Decreased safety awareness;Decreased balance;Decreased mobility;Decreased coordination  PT Treatment Interventions Functional mobility training;Therapeutic activities;Therapeutic exercise;Patient/family education;Cognitive remediation;Neuromuscular re-education;Balance training   PT Goals (Current goals can be found in the Care Plan section) Acute Rehab PT Goals PT Goal Formulation: Patient unable to participate in goal setting Time For Goal Achievement:  04/17/14 Potential to Achieve Goals: Fair    Frequency Min 3X/week   Barriers to discharge   spoke to granddgtr who was unclear what family desire for D/C would be, currently 24hr min assist available    Co-evaluation PT/OT/SLP Co-Evaluation/Treatment: Yes Reason for Co-Treatment: Complexity of the patient's impairments (multi-system involvement);For patient/therapist safety PT goals addressed during session: Mobility/safety with mobility;Balance         End of Session Equipment Utilized During Treatment: Gait belt Activity Tolerance: Patient tolerated treatment well Patient left: in chair;with call bell/phone within reach;with chair alarm set Nurse Communication: Mobility status;Need for lift equipment;Precautions         Time: 6010-9323 PT Time Calculation (min): 26 min   Charges:   PT Evaluation $Initial PT Evaluation Tier I: 1 Procedure PT Treatments $Therapeutic Activity: 8-22 mins   PT G Codes:          Melford Aase 04/03/2014, 10:19 AM Elwyn Reach, [place]

## 2014-04-04 ENCOUNTER — Encounter (HOSPITAL_COMMUNITY): Payer: Self-pay | Admitting: General Practice

## 2014-04-04 DIAGNOSIS — N390 Urinary tract infection, site not specified: Secondary | ICD-10-CM

## 2014-04-04 LAB — GLUCOSE, CAPILLARY
Glucose-Capillary: 161 mg/dL — ABNORMAL HIGH (ref 70–99)
Glucose-Capillary: 185 mg/dL — ABNORMAL HIGH (ref 70–99)
Glucose-Capillary: 106 mg/dL — ABNORMAL HIGH (ref 70–99)
Glucose-Capillary: 169 mg/dL — ABNORMAL HIGH (ref 70–99)

## 2014-04-04 NOTE — Progress Notes (Signed)
Awake all night. A & O to self only, pleasantly confused. Legs off side of bed X 1 during night. Bed alarm in use to alert staff of attempts up without assistance. [name] A

## 2014-04-04 NOTE — Evaluation (Signed)
Speech Language Pathology Evaluation Patient Details Name: [name] MRN: [id number] DOB: 1928-03-27 Today's Date: 04/04/2014 Time: 1012-1017 SLP Time Calculation (min): 5 min  Problem List:  Patient Active Problem List   Diagnosis Date Noted  . Cerebral thrombosis with cerebral infarction 04/02/2014  . Acute left-sided weakness 04/01/2014  . UTI (lower urinary tract infection) 04/01/2014  . Elevated troponin 04/01/2014  . SDH (subdural hematoma) 03/11/2013  . Anemia 08/21/2012  . HTN (hypertension) 07/04/2012  . CKD (chronic kidney disease) 12/05/2011  . Ischemic heart disease 02/04/2011  . Benign hypertensive heart disease without heart failure 02/04/2011  . Diabetes mellitus type 2, insulin dependent 02/04/2011  . Hypercholesterolemia 02/04/2011   Past Medical History:  Past Medical History  Diagnosis Date  . Coronary artery disease     post CABG in 1997 with abnormal nuclear stress test in 2007 which did demonstrate mild reversible ischemia and has been managed medically.   . Diabetes mellitus     type 2  . Hypertension   . Hyperlipidemia   . Hypercholesterolemia   . Atypical chest pain   . Hypernatremia   . History of hypoglycemia   . Syncope   . CVA (cerebral infarction)   . Allergy   . History of chicken pox   . Acute on chronic renal insufficiency   . CKD (chronic kidney disease)   . GERD (gastroesophageal reflux disease)   . H/O hiatal hernia    Past Surgical History:  Past Surgical History  Procedure Laterality Date  . Coronary artery bypass graft  1997    x3 -- patent internal mammary graft to saphenous vein grafts x3  . Tonsillectomy    . Cardiac catheterization  01/26/1999    patent internal mammary graft to saphenous vein grafts x3 -- singnificant three-vessel coroanry artery disease -- potential sites of ischemia involving the intermediate branch, circumflex branch and the distal LAD through diffuse disease, as well as steal type syndrome -- normal  LV function -- W. Doristine Church., M.D.    . Esophagogastroduodenoscopy  07/22/2012    Procedure: ESOPHAGOGASTRODUODENOSCOPY (EGD);  Surgeon: Beryle Beams, MD;  Location: [facility];  Service: Endoscopy;  Laterality: N/A;  . Colonoscopy  07/22/2012    Procedure: COLONOSCOPY;  Surgeon: Beryle Beams, MD;  Location: [facility];  Service: Endoscopy;  Laterality: N/A;   HPI:  80 y.o. male with past medical history of dementia, CVA, CABG, chonic kidney disease, GERD, hiatal hernia, HTN, acute renal insufficiency, ischemic heart disease and history of SDH. Pt. admitted with left-sided weakness and MRI found multiple areas of restricted diffusion affecting the RIGHT anterior circulation consistent with acute infarction. Slight increased size of the LEFT frontotemporoparietal subdural hygroma.  CXR is pending.   Assessment / Plan / Recommendation Clinical Impression  Patient presents with a moderate-severe cognitive impairement, likely baseline dementia exacerbated by acute neuro changes. No acute SLP f/u indicated however recommend SLP f/u in next venue of care with focus on family education and use of compensatory strategies to improve overall safety and efficiency with basic ADLs.     SLP Assessment  All further Speech Lanaguage Pathology  needs can be addressed in the next venue of care    Follow Up Recommendations  Inpatient Rehab       Pertinent Vitals/Pain Pain Assessment: No/denies pain   SLP Goals  Progression toward goals: Goals met, education completed, patient discharged from SLP  SLP Evaluation Prior Functioning  Cognitive/Linguistic Baseline: Baseline deficits Baseline deficit details: baseline dementia  requiring assistance with some ADLS Type of Home: House   Cognition  Overall Cognitive Status: Impaired/Different from baseline Arousal/Alertness: Awake/alert Orientation Level: Oriented to person;Disoriented to place;Disoriented to time;Disoriented to  situation Attention: Sustained Sustained Attention: Impaired Sustained Attention Impairment: Verbal basic;Functional basic Memory: Impaired Memory Impairment: Storage deficit;Retrieval deficit Awareness: Impaired Awareness Impairment: Intellectual impairment Problem Solving: Impaired Problem Solving Impairment: Verbal basic;Functional basic Safety/Judgment: Impaired    Comprehension  Auditory Comprehension Overall Auditory Comprehension: Impaired Yes/No Questions: Impaired Basic Biographical Questions: 0-25% accurate Commands: Impaired One Step Basic Commands: 25-49% accurate Interfering Components: Attention;Working memory;Processing speed EffectiveTechniques: Visual/Gestural cues (contextual cues) Reading Comprehension Reading Status: Impaired (functional calendar use)    Expression Expression Primary Mode of Expression: Verbal Verbal Expression Overall Verbal Expression: Other (comment) (perseverative, likely due to attention/cognition) Written Expression Dominant Hand: Right   Oral / Motor Oral Motor/Sensory Function Overall Oral Motor/Sensory Function: Appears within functional limits for tasks assessed Motor Speech Overall Motor Speech: Appears within functional limits for tasks assessed   GO   Gabriel Rainwater MA, CCC-SLP [phone]   Skanda Worlds Meryl 04/04/2014, 10:53 AM

## 2014-04-04 NOTE — Progress Notes (Signed)
CSW [employer] technical [employer]) spoke with family and provided with bed offers. They would like for pt to dc to [facility]. However, pt daughter informed CSW they would be agreeable for pt to dc to [facility] if there are any issues with receiving insurance authorization for [employer]. CSW notified [facility] of bed acceptance and asked to please start insurance authorization today.  [place], [place]

## 2014-04-04 NOTE — Consult Note (Signed)
Physical Medicine and Rehabilitation Consult Reason for Consult: CVA Referring Physician: [facility]   HPI: [name] is a 80 y.o. right hand male with history of CAD status post CABG, hypertension, diabetes mellitus or peripheral neuropathy and dementia. Presented 04/01/2014 with left-sided weakness. Patient lives with his wife using a single-point cane as needed prior to admission as well as needing some assistance for ADLs. MRI of the brain showed multiple areas of restricted diffusion affecting the right anterior circulation consistent with acute infarction as well as incidental findings of a left frontal temporal parietal subdural hygroma 19 mm thick. Patient did not receive TPA. Echocardiogram with grade 1 diastolic dysfunction without emboli. Carotid Dopplers with right 60-79% ICA stenosis. MRA head and neck with diminished caliber of the right ICA compared to the left progressive since 2013. Neurology services consulted maintained on Plavix for CVA prophylaxis. Tolerating a mechanical soft diet. Urine culture greater than 100,000 gram-negative rods maintained on Rocephin. Physical and occupational therapy evaluations completed 04/03/2014 with recommendations of physical medicine rehabilitation consult.   Review of Systems  Unable to perform ROS: mental acuity   Past Medical History  Diagnosis Date  . Coronary artery disease     post CABG in 1997 with abnormal nuclear stress test in 2007 which did demonstrate mild reversible ischemia and has been managed medically.   . Diabetes mellitus     type 2  . Hypertension   . Hyperlipidemia   . Hypercholesterolemia   . Atypical chest pain   . Hypernatremia   . History of hypoglycemia   . Syncope   . CVA (cerebral infarction)   . Allergy   . History of chicken pox   . Acute on chronic renal insufficiency   . CKD (chronic kidney disease)   . GERD (gastroesophageal reflux disease)   . H/O hiatal hernia    Past Surgical History    Procedure Laterality Date  . Coronary artery bypass graft  1997    x3 -- patent internal mammary graft to saphenous vein grafts x3  . Tonsillectomy    . Cardiac catheterization  01/26/1999    patent internal mammary graft to saphenous vein grafts x3 -- singnificant three-vessel coroanry artery disease -- potential sites of ischemia involving the intermediate branch, circumflex branch and the distal LAD through diffuse disease, as well as steal type syndrome -- normal LV function -- W. Doristine Church., M.D.    . Esophagogastroduodenoscopy  07/22/2012    Procedure: ESOPHAGOGASTRODUODENOSCOPY (EGD);  Surgeon: Beryle Beams, MD;  Location: [facility];  Service: Endoscopy;  Laterality: N/A;  . Colonoscopy  07/22/2012    Procedure: COLONOSCOPY;  Surgeon: Beryle Beams, MD;  Location: [facility];  Service: Endoscopy;  Laterality: N/A;   Family History  Problem Relation Age of Onset  . Hypertension Other     Parent  . Diabetes Other     Parent  . Hyperlipidemia Other     Parent   Social History:  reports that he has never smoked. He has never used smokeless tobacco. He reports that he does not drink alcohol or use illicit drugs. Allergies: No Known Allergies Medications Prior to Admission  Medication Sig Dispense Refill  . ENSURE (ENSURE) Take 1 Can by mouth daily.      Marland Kitchen glipiZIDE (GLUCOTROL) 10 MG tablet Take 10 mg by mouth 2 (two) times daily.      . isosorbide mononitrate (IMDUR) 30 MG 24 hr tablet Take 30 mg by mouth daily.      Marland Kitchen  metoprolol (LOPRESSOR) 50 MG tablet Take 50 mg by mouth 2 (two) times daily.      Marland Kitchen omeprazole (PRILOSEC) 20 MG capsule Take 20 mg by mouth daily.        Home: Home Living Family/patient expects to be discharged to:: Private residence Living Arrangements: Spouse/significant other;Children Available Help at Discharge: Family;Available 24 hours/day Type of Home: House Home Access: Stairs to enter [employer] of Steps: 6 Entrance  Stairs-Rails: Right;Left;Can reach both Home Layout: One level Home Equipment: Grab bars - tub/shower;Walker - 4 wheels;Shower seat;Cane - single point;Hand held shower head Additional Comments: Information obtained through PT, who spoke with family.  Functional History: Prior Function Level of Independence: Needs assistance Gait / Transfers Assistance Needed: pt normally walking in house alone, does not walk out of house ADL's / Homemaking Assistance Needed: Pt requires total assist with bathing and dressing. Pt is able to self-feed at baseline. Family does IADLs.  Comments: Information obtained by PT, who spoke with pt's family.  Functional Status:  Mobility: Bed Mobility Overal bed mobility: Needs Assistance;+2 for physical assistance Bed Mobility: Rolling;Sidelying to Sit Rolling: Max assist Sidelying to sit: Max assist General bed mobility comments: Pt sitting EOB with PT when OT arrived. Per PT, pt required max (A) for bed mobility.  Transfers Overall transfer level: Needs assistance Equipment used:  (2 person assisted gait belt transfer) Transfers: Sit to/from [facility] Sit to Stand: Max assist;+2 physical assistance Stand pivot transfers: Total assist;+2 physical assistance General transfer comment: LLE blocked throughout with use of pad and belt to stand pt with 2 person assist max cues and assist. With pivot total assist to rotate pelvis and pt not advancing feet. Pt maintaining flexed posture throughout despite assist for trunk extension and cues Ambulation/Gait Ambulation/Gait assistance:  (pt unable)    ADL: ADL Overall ADL's : Needs assistance/impaired Grooming: Sitting;Moderate assistance Grooming Details (indicate cue type and reason): Hand over hand to initiate task of rubbing lotion onto his LUE. Pt able to continue and complete task without perseveration.  Upper Body Bathing: Sitting;Maximal assistance Lower Body Bathing: Total assistance;+2 for  physical assistance;Sit to/from stand Upper Body Dressing : Total assistance;Maximal assistance Lower Body Dressing: Total assistance;+2 for physical assistance;Sit to/from stand Toilet Transfer: Total assistance;+2 for physical assistance;Stand-pivot Toilet Transfer Details (indicate cue type and reason): Performed stand-pivot to chair to simulate [facility] transfer. However, Pt also required total assist to position hand held urinal while seated.  General ADL Comments: Pt presents with baseline cognitive impairment (dementia) however also feel that he demonstrates receptive aphasia contributing to difficulty following commands for ADL completion. Pt has R sided gaze preference with Lt sided inattention. Pt requires tactile cues and hand over hand assist for basic grooming tasks.   Cognition: Cognition Overall Cognitive Status: Impaired/Different from baseline Orientation Level: Oriented to person;Disoriented to place;Disoriented to time;Disoriented to situation Cognition Arousal/Alertness: Awake/alert Behavior During Therapy: Flat affect Overall Cognitive Status: Impaired/Different from baseline Area of Impairment: Orientation;Attention;Memory;Following commands;Safety/judgement;Awareness;Problem solving Orientation Level: Disoriented to;Place;Time;Situation Current Attention Level: Sustained Memory: Decreased short-term memory Following Commands: Follows one step commands inconsistently Safety/Judgement: Decreased awareness of deficits;Decreased awareness of safety Awareness: Intellectual Problem Solving: Slow processing;Decreased initiation;Difficulty sequencing;Requires tactile cues;Requires verbal cues General Comments: pt with baseline dementia with varied cognition at baseline with periods of being incoherent per family. Pt currently does not demonstrate awareness of current deficits with hemiplegia  Blood pressure 188/66, pulse 90, temperature 97.8 F (36.6 C), temperature source Oral,  resp. rate 18, height 6\' 1"  (  1.854 m), weight 63.912 kg (140 lb 14.4 oz), SpO2 100.00%. Physical Exam  Constitutional:  80 year old frail African American male.  HENT:  Head: Normocephalic.  Eyes:  Pupils reactive to light  Neck: Normal range of motion. Neck supple. No thyromegaly present.  Cardiovascular: Normal rate and regular rhythm.   Respiratory: Effort normal and breath sounds normal. No respiratory distress.  GI: Soft. Bowel sounds are normal. He exhibits no distension.  Neurological:  Patient is pleasantly confused. He makes good eye contact with examiner. Marland Kitchen He would attempt some simple conversation. Left inattention but can be cued to look that way. ?inconcistent movement of LUE and LLE (very limited). Able to tell me where he lives. Follow simple commands    Results for orders placed during the hospital encounter of 04/01/14 (from the past 24 hour(s))  CBC     Status: Abnormal   Collection Time    04/03/14  5:58 AM      Result Value Ref Range   WBC 5.9  4.0 - 10.5 K/uL   RBC 4.05 (*) 4.22 - 5.81 MIL/uL   Hemoglobin 12.4 (*) 13.0 - 17.0 g/dL   HCT 37.1 (*) 39.0 - 52.0 %   MCV 91.6  78.0 - 100.0 fL   MCH 30.6  26.0 - 34.0 pg   MCHC 33.4  30.0 - 36.0 g/dL   RDW 13.2  11.5 - 15.5 %   Platelets 260  150 - 400 K/uL  GLUCOSE, CAPILLARY     Status: None   Collection Time    04/03/14  7:47 AM      Result Value Ref Range   Glucose-Capillary 94  70 - 99 mg/dL  BASIC METABOLIC PANEL     Status: Abnormal   Collection Time    04/03/14  9:00 AM      Result Value Ref Range   Sodium 145  137 - 147 mEq/L   Potassium 4.2  3.7 - 5.3 mEq/L   Chloride 108  96 - 112 mEq/L   CO2 26  19 - 32 mEq/L   Glucose, Bld 138 (*) 70 - 99 mg/dL   BUN 29 (*) 6 - 23 mg/dL   Creatinine, Ser 2.49 (*) 0.50 - 1.35 mg/dL   Calcium 9.2  8.4 - 10.5 mg/dL   GFR calc non Af Amer 22 (*) >90 mL/min   GFR calc Af Amer 26 (*) >90 mL/min   Anion gap 11  5 - 15  GLUCOSE, CAPILLARY     Status: Abnormal    Collection Time    04/03/14 11:50 AM      Result Value Ref Range   Glucose-Capillary 136 (*) 70 - 99 mg/dL   Comment 1 Documented in Chart     Comment 2 Notify RN    GLUCOSE, CAPILLARY     Status: Abnormal   Collection Time    04/03/14  4:56 PM      Result Value Ref Range   Glucose-Capillary 138 (*) 70 - 99 mg/dL   Comment 1 Documented in Chart     Comment 2 Notify RN    GLUCOSE, CAPILLARY     Status: Abnormal   Collection Time    04/03/14  8:51 PM      Result Value Ref Range   Glucose-Capillary 177 (*) 70 - 99 mg/dL   Dg Chest 2 View  04/02/2014   CLINICAL DATA:  Stroke, patient unresponsive  EXAM: CHEST  2 VIEW  COMPARISON:  03/11/2013, 06/08/2012  FINDINGS: The  heart size and vascular pattern are normal. Patient is status post previous CABG. No consolidation or effusion. 5 mm right upper lobe nodular opacity, not seen on prior study.  IMPRESSION: No acute cardiopulmonary process. 5 mm right upper lobe nodular opacity. This can be re-evaluated on anticipated followup chest radiographs. Alternatively a CT thorax could be considered.   Electronically Signed   By: Skipper Cliche M.D.   On: 04/02/2014 21:21   Mr Angiogram Neck Wo Contrast  04/02/2014   CLINICAL DATA:  New onset of LEFT-sided weakness. Exact time of onset is uncertain, but likely greater than 24 hr duration. Chronic extra-axial fluid collections are reported. Stroke risk factors include diabetes, hypertension, hyperlipidemia, and history of previous stroke.  EXAM: MRI HEAD WITHOUT CONTRAST  MRA HEAD WITHOUT CONTRAST  MRA NECK WITHOUT CONTRAST  TECHNIQUE: Multiplanar, multiecho pulse sequences of the brain and surrounding structures were obtained without intravenous contrast. Angiographic images of the Circle of Willis were obtained using MRA technique without intravenous contrast. Angiographic images of the neck were obtained using MRA technique without intravenous contrast. Carotid stenosis measurements (when applicable) are  obtained utilizing NASCET criteria, using the distal internal carotid diameter as the denominator.  COMPARISON:  CT head 04/01/2014. MR head 03/15/2013. MRI and MRA 12/06/2011  FINDINGS: MRI HEAD FINDINGS  The patient was unable to remain motionless for the exam. Small or subtle lesions could be overlooked. Overall exam diagnostic.  Areas of gyriform restricted diffusion affect the RIGHT frontal and RIGHT posterior frontal cortex in the parasagittal location, the consistent with acute infarction. Single focus of restricted diffusion more posteriorly adjacent to the old hemorrhagic infarct. No other areas of restricted diffusion.  BILATERAL areas of cortical infarction, some which display chronic hemorrhage appears stable. Chronic of LEFT frontotemporoparietal subdural hygroma, CSF intensity on all pulse sequences, measures up to 19 mm, with moderate mass effect on the LEFT hemisphere, slightly increased in 2014. There is a much smaller RIGHT parietal collection, more complex, up to 7 mm thick without significant mass effect, likely representing combination of hygroma and chronic subdural hematoma, essentially stable from 2014.  Flow voids are maintained. No midline abnormality. No acute sinus or mastoid disease.  MRA HEAD FINDINGS  Widely patent internal carotid artery on the LEFT. 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 Proximal flow reducing lesion is discussed in the MRA Neck section. There is suspected 50% stenosis of the cavernous and supraclinoid ICA on the RIGHT, non flow reducing. Azygos LEFT ACA supplies both anterior cerebrals. No MCA stenosis or branch occlusion. Basilar artery widely patent with LEFT vertebral dominant. Mildly diseased V4 segment distal RIGHT vertebral. No cerebellar branch occlusion. Mild irregularity both proximal posterior cerebral  arteries.  MRA NECK FINDINGS  No priors for comparison. There is suspected flow reducing stenosis of the RIGHT ICA, estimated 75-90%. Distal right common carotid artery plaque narrows that vessel without definite ulceration. Unremarkable appearing LEFT carotid bifurcation. Both vertebrals are patent, with the LEFT dominant.  IMPRESSION: Multiple areas of restricted diffusion affecting the RIGHT anterior circulation consistent with acute infarction.  Slight increased size of the LEFT frontotemporoparietal subdural hygroma, now 19 mm thick. Stable chronic mixed signal intensity RIGHT hemisphere extra-axial collection.  Diminished caliber of the RIGHT ICA compared to the LEFT, progressive since 2013, relates to an apparent flow reducing 75-90% stenosis at the RICA origin.   Electronically  Signed   By: Rolla Flatten M.D.   On: 04/02/2014 11:33   [name]  04/02/2014   CLINICAL DATA:  New onset of LEFT-sided weakness. Exact time of onset is uncertain, but likely greater than 24 hr duration. Chronic extra-axial fluid collections are reported. Stroke risk factors include diabetes, hypertension, hyperlipidemia, and history of previous stroke.  EXAM: MRI HEAD WITHOUT CONTRAST  MRA HEAD WITHOUT CONTRAST  MRA NECK WITHOUT CONTRAST  TECHNIQUE: Multiplanar, multiecho pulse sequences of the brain and surrounding structures were obtained without intravenous contrast. Angiographic images of the Circle of Willis were obtained using MRA technique without intravenous contrast. Angiographic images of the neck were obtained using MRA technique without intravenous contrast. Carotid stenosis measurements (when applicable) are obtained utilizing NASCET criteria, using the distal internal carotid diameter as the denominator.  COMPARISON:  CT head 04/01/2014. MR head 03/15/2013. MRI and MRA 12/06/2011  FINDINGS: MRI HEAD FINDINGS  The patient was unable to remain motionless for the exam. Small or subtle lesions could be  overlooked. Overall exam diagnostic.  Areas of gyriform restricted diffusion affect the RIGHT frontal and RIGHT posterior frontal cortex in the parasagittal location, the consistent with acute infarction. Single focus of restricted diffusion more posteriorly adjacent to the old hemorrhagic infarct. No other areas of restricted diffusion.  BILATERAL areas of cortical infarction, some which display chronic hemorrhage appears stable. Chronic of LEFT frontotemporoparietal subdural hygroma, CSF intensity on all pulse sequences, measures up to 19 mm, with moderate mass effect on the LEFT hemisphere, slightly increased in 2014. There is a much smaller RIGHT parietal collection, more complex, up to 7 mm thick without significant mass effect, likely representing combination of hygroma and chronic subdural hematoma, essentially stable from 2014.  Flow voids are maintained. No midline abnormality. No acute sinus or mastoid disease.  MRA HEAD FINDINGS  Widely patent internal carotid artery on the LEFT. 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 Proximal flow reducing lesion is discussed in the MRA Neck section. There is suspected 50% stenosis of the cavernous and supraclinoid ICA on the RIGHT, non flow reducing. Azygos LEFT ACA supplies both anterior cerebrals. No MCA stenosis or branch occlusion. Basilar artery widely patent with LEFT vertebral dominant. Mildly diseased V4 segment distal RIGHT vertebral. No cerebellar branch occlusion. Mild irregularity both proximal posterior cerebral arteries.  MRA NECK FINDINGS  No priors for comparison. There is suspected flow reducing stenosis of the RIGHT ICA, estimated 75-90%. Distal right common carotid artery plaque narrows that vessel without definite ulceration. Unremarkable appearing LEFT carotid bifurcation. Both vertebrals are patent,  with the LEFT dominant.  IMPRESSION: Multiple areas of restricted diffusion affecting the RIGHT anterior circulation consistent with acute infarction.  Slight increased size of the LEFT frontotemporoparietal subdural hygroma, now 19 mm thick. Stable chronic mixed signal intensity RIGHT hemisphere extra-axial collection.  Diminished caliber of the RIGHT ICA compared to the LEFT, progressive since 2013, relates to an apparent flow reducing 75-90% stenosis at the RICA origin.   Electronically Signed   By: Rolla Flatten M.D.   On: 04/02/2014 11:33   Mr Jodene Nam Head/brain Wo Cm  04/02/2014   CLINICAL DATA:  New onset of LEFT-sided weakness. Exact time of onset is uncertain, but likely greater than 24 hr duration. Chronic extra-axial fluid collections are reported. Stroke risk factors include diabetes, hypertension, hyperlipidemia, and history of previous  stroke.  EXAM: MRI HEAD WITHOUT CONTRAST  MRA HEAD WITHOUT CONTRAST  MRA NECK WITHOUT CONTRAST  TECHNIQUE: Multiplanar, multiecho pulse sequences of the brain and surrounding structures were obtained without intravenous contrast. Angiographic images of the Circle of Willis were obtained using MRA technique without intravenous contrast. Angiographic images of the neck were obtained using MRA technique without intravenous contrast. Carotid stenosis measurements (when applicable) are obtained utilizing NASCET criteria, using the distal internal carotid diameter as the denominator.  COMPARISON:  CT head 04/01/2014. MR head 03/15/2013. MRI and MRA 12/06/2011  FINDINGS: MRI HEAD FINDINGS  The patient was unable to remain motionless for the exam. Small or subtle lesions could be overlooked. Overall exam diagnostic.  Areas of gyriform restricted diffusion affect the RIGHT frontal and RIGHT posterior frontal cortex in the parasagittal location, the consistent with acute infarction. Single focus of restricted diffusion more posteriorly adjacent to the old hemorrhagic infarct. No  other areas of restricted diffusion.  BILATERAL areas of cortical infarction, some which display chronic hemorrhage appears stable. Chronic of LEFT frontotemporoparietal subdural hygroma, CSF intensity on all pulse sequences, measures up to 19 mm, with moderate mass effect on the LEFT hemisphere, slightly increased in 2014. There is a much smaller RIGHT parietal collection, more complex, up to 7 mm thick without significant mass effect, likely representing combination of hygroma and chronic subdural hematoma, essentially stable from 2014.  Flow voids are maintained. No midline abnormality. No acute sinus or mastoid disease.  MRA HEAD FINDINGS  Widely patent internal carotid artery on the LEFT. 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 Proximal flow reducing lesion is discussed in the MRA Neck section. There is suspected 50% stenosis of the cavernous and supraclinoid ICA on the RIGHT, non flow reducing. Azygos LEFT ACA supplies both anterior cerebrals. No MCA stenosis or branch occlusion. Basilar artery widely patent with LEFT vertebral dominant. Mildly diseased V4 segment distal RIGHT vertebral. No cerebellar branch occlusion. Mild irregularity both proximal posterior cerebral arteries.  MRA NECK FINDINGS  No priors for comparison. There is suspected flow reducing stenosis of the RIGHT ICA, estimated 75-90%. Distal right common carotid artery plaque narrows that vessel without definite ulceration. Unremarkable appearing LEFT carotid bifurcation. Both vertebrals are patent, with the LEFT dominant.  IMPRESSION: Multiple areas of restricted diffusion affecting the RIGHT anterior circulation consistent with acute infarction.  Slight increased size of the LEFT frontotemporoparietal subdural hygroma, now 19 mm thick. Stable chronic mixed signal intensity RIGHT hemisphere  extra-axial collection.  Diminished caliber of the RIGHT ICA compared to the LEFT, progressive since 2013, relates to an apparent flow reducing 75-90% stenosis at the RICA origin.   Electronically Signed   By: Rolla Flatten M.D.   On: 04/02/2014 11:33    Assessment/Plan: Diagnosis: right MCA infarct, left frontal sd hygroma 1. Does the need for close, 24 hr/day medical supervision in concert with the patient's rehab needs make it unreasonable for this patient to be served in a less intensive setting? Yes 2. Co-Morbidities requiring supervision/potential complications: ckd, ihd, htn 3. Due to bladder management, bowel management, safety, skin/wound care, disease management, medication administration, pain management and patient education, does the patient require 24 hr/day rehab nursing? Yes 4. Does the patient require coordinated care of a physician, rehab nurse, PT (1-2 hrs/day, 5 days/week), OT (1-2 hrs/day, 5 days/week) and SLP (1-2 hrs/day, 5 days/week) to address  physical and functional deficits in the context of the above medical diagnosis(es)? Yes Addressing deficits in the following areas: balance, endurance, locomotion, strength, transferring, bowel/bladder control, bathing, dressing, feeding, grooming, toileting and psychosocial support 5. Can the patient actively participate in an intensive therapy program of at least 3 hrs of therapy per day at least 5 days per week? Yes 6. The potential for patient to make measurable gains while on inpatient rehab is excellent 7. Anticipated functional outcomes upon discharge from inpatient rehab are supervision and min assist  with PT, min assist and mod assist with OT, supervision and min assist with SLP. 8. Estimated rehab length of stay to reach the above functional goals is: 20-25 days 9. Does the patient have adequate social supports to accommodate these discharge functional goals? Yes, potentially 10. Anticipated D/C setting: Home 11. Anticipated  post D/C treatments: HH therapy and Outpatient therapy 12. Overall Rehab/Functional Prognosis: fair  RECOMMENDATIONS: This patient's condition is appropriate for continued rehabilitative care in the following setting: CIR Patient has agreed to participate in recommended program. Potentially Note that insurance prior authorization may be required for reimbursement for recommended care.  Comment: Need to follow up on availability and capability of social supports to provide him care at discharge from [place].  Meredith Staggers, MD, [facility] 04/04/2014     04/04/2014

## 2014-04-04 NOTE — Progress Notes (Signed)
Speech Language Pathology Treatment: Dysphagia  Patient Details Name: [name] MRN: [id number] DOB: July 28, 1927 Today's Date: 04/04/2014 Time: 9340-6840 SLP Time Calculation (min): 8 min  Assessment / Plan / Recommendation Clinical Impression  F/u diagnostic treatment complete. Po trials provided. Patient able to consume with only subtle s/s of difficulty characterized by throat clear during prolonged mastication of solids without dentures in place, likely pooling of secretions. Dentures located and placed by SLP with significant improvement in oral transit time and functionality of swallow overall with improved evidence airway protection. Oral phase minimally delayed, characteristic of dementia. Overall, patient appears to be tolerating current diet which remains most appropriate for patient at this time. No SLP f/u indicated for dysphagia at this time.    HPI HPI: 80 y.o. male with past medical history of dementia, CVA, CABG, chonic kidney disease, GERD, hiatal hernia, HTN, acute renal insufficiency, ischemic heart disease and history of SDH. Pt. admitted with left-sided weakness and MRI found multiple areas of restricted diffusion affecting the RIGHT anterior circulation consistent with acute infarction. Slight increased size of the LEFT frontotemporoparietal subdural hygroma.  CXR is pending.   Pertinent Vitals Pain Assessment: No/denies pain  SLP Plan  All goals met    Recommendations Diet recommendations: Dysphagia 3 (mechanical soft);Thin liquid Liquids provided via: Straw;Cup Medication Administration: Whole meds with liquid Supervision: Patient able to self feed;Full supervision/cueing for compensatory strategies;Staff to assist with self feeding Compensations: Slow rate;Small sips/bites Postural Changes and/or Swallow Maneuvers: Seated upright 90 degrees;Upright 30-60 min after meal              General recommendations: Rehab consult Oral Care Recommendations: Oral care  BID Follow up Recommendations: Inpatient Rehab Plan: All goals met    [place], [place] [phone]   Carin Shipp Meryl 04/04/2014, 10:46 AM

## 2014-04-04 NOTE — Progress Notes (Signed)
Medicare Important Message given? YES  (If response is "NO", the following Medicare IM given date fields will be blank)  Date Medicare IM given: 04/04/14 Medicare IM given by:  Marvetta Gibbons RN

## 2014-04-04 NOTE — Discharge Summary (Signed)
Physician Discharge Summary  Khallid Pasillas OEU:[id number] DOB: 1928-02-06 DOA: 04/01/2014  PCP: Webb Silversmith, NP  Admit date: 04/01/2014 Discharge date: 04/04/2014  Time spent: 35 minutes  Recommendations for Outpatient Follow-up:  1. Follow up with Neurology in 2 months.  2. Needs to follow up with cardio to consider outpatient stress test.  3. Please follow up urine culture.   Discharge Diagnoses:    Cerebral thrombosis with cerebral infarction   Elevated troponin, probably demand ischemia.    Ischemic heart disease   Diabetes mellitus type 2, insulin dependent   CKD (chronic kidney disease)   HTN (hypertension)   SDH (subdural hematoma)   Acute left-sided weakness   UTI (lower urinary tract infection)    Discharge Condition: Stable.   Diet recommendation: heart Healthy  Filed Weights   04/01/14 1923 04/02/14 0300 04/03/14 0400  Weight: 63.866 kg (140 lb 12.8 oz) 63.655 kg (140 lb 5.3 oz) 63.912 kg (140 lb 14.4 oz)    History of present illness:  [name] is a 80 y.o. male with past medical history of dementia, ischemic heart disease and history of SDH. Patient brought to the hospital because of left-sided weakness. There was no family members at bedside, patient is demented and confused, so the H&P obtained from the EDP notes. Apparently patient was in his urine of health until about 7:30 PM when he started to have left-sided weakness, his son helped him to get back to the bed, this is continues this morning so they brought him to the hospital for further evaluation. Patient is confused so no much of complaints.  In the ED he was found to have troponin of 0.82, urinalysis is consistent with UTI. Patient admitted to the hospital for further evaluation.      Hospital Course:  Acute Stroke:  -MRI; Multiple areas of restricted diffusion affecting the RIGHT anterior circulation consistent with acute infarction.  -Patient admitted to the hospital for further evaluation.  -CT  scan done in the ED and showed no evidence of acute events.  -LDL 160, started on statins.  On plavix for secondary stroke prevention.   UTI  -Patient started empirically on Rocephin, urinalysis consistent with UTI.  -urine culture pending. Gram negative rods.  -Continue with ceftriaxone, day 4/7  Bradycardia; hold metoprolol. If HR increase will need to resume lower dose metoprolol.   Elevated troponin  - continue aspirin, statin.  -Patient has old subdural hematoma, no change since last year, I think aspirin would be safe.  -Likely no intervention as this probably might be secondary to his CVA or elevated creatinine,  -troponin elevated. ECHO normal EF, and no wall motion abnormalities.  -Elevation of troponin related to demand ischemia in setting stroke, renal failure. outpatient stress test.    Diabetes mellitus  -hold glipizide.  -Started on insulin sliding scale.   CKD stage III  -Creatinine is 2.38, this is around his baseline. Monitor while in the hospital.  -stable.   SDH  -Has chronic right-sided SDH, no mass effect, no changes since about a year ago.  -No active bleeding.    Procedures: ECHO:Left ventricle: The cavity size was normal. There was mild concentric hypertrophy. Systolic function was probably normal. Doppler parameters are consistent with abnormal left ventricular relaxation (grade 1 diastolic dysfunction). - Mitral valve: There was mild regurgitation.    Doppler; Right: 60-79% internal carotid artery stenosis. Left: 1-39% ICA stenosis. Bilateral: Vertebral artery flow is antegrade.    Consultations:  Neurology  Discharge Exam: Filed Vitals:  04/04/14 0817  BP: 136/69  Pulse: 94  Temp: 97.7 F (36.5 C)  Resp: 18    General: Alert in no distress.  Cardiovascular: S 1, S 2 RRR Respiratory: CTA  Discharge Instructions You were cared for by a hospitalist during your hospital stay. If you have any questions about your discharge  medications or the care you received while you were in the hospital after you are discharged, you can call the unit and asked to speak with the hospitalist on call if the hospitalist that took care of you is not available. Once you are discharged, your primary care physician will handle any further medical issues. Please note that NO REFILLS for any discharge medications will be authorized once you are discharged, as it is imperative that you return to your primary care physician (or establish a relationship with a primary care physician if you do not have one) for your aftercare needs so that they can reassess your need for medications and monitor your lab values.   Current Discharge Medication List    CONTINUE these medications which have NOT CHANGED   Details  ENSURE (ENSURE) Take 1 Can by mouth daily.    glipiZIDE (GLUCOTROL) 10 MG tablet Take 10 mg by mouth 2 (two) times daily.    isosorbide mononitrate (IMDUR) 30 MG 24 hr tablet Take 30 mg by mouth daily.    metoprolol (LOPRESSOR) 50 MG tablet Take 50 mg by mouth 2 (two) times daily.    omeprazole (PRILOSEC) 20 MG capsule Take 20 mg by mouth daily.       No Known Allergies Follow-up Information   Follow up with SETHI,PRAMOD, MD. Schedule an appointment as soon as possible for a visit in 2 months. (Stroke Clinic)    Specialties:  Neurology, Radiology   Contact information:   [address] [place] [address] [phone]        The results of significant diagnostics from this hospitalization (including imaging, microbiology, ancillary and laboratory) are listed below for reference.    Significant Diagnostic Studies: Dg Chest 2 View  04/02/2014   CLINICAL DATA:  Stroke, patient unresponsive  EXAM: CHEST  2 VIEW  COMPARISON:  03/11/2013, 06/08/2012  FINDINGS: The heart size and vascular pattern are normal. Patient is status post previous CABG. No consolidation or effusion. 5 mm right upper lobe nodular opacity,  not seen on prior study.  IMPRESSION: No acute cardiopulmonary process. 5 mm right upper lobe nodular opacity. This can be re-evaluated on anticipated followup chest radiographs. Alternatively a CT thorax could be considered.   Electronically Signed   By: Skipper Cliche M.D.   On: 04/02/2014 21:21   Ct Head Wo Contrast  04/01/2014   CLINICAL DATA:  80 year old male with left-sided weakness and pain.  EXAM: CT HEAD WITHOUT CONTRAST  TECHNIQUE: Contiguous axial images were obtained from the base of the skull through the vertex without intravenous contrast.  COMPARISON:  Head CT 03/30/2013, 03/11/2013.  MRI 03/15/2013  FINDINGS: Unremarkable appearance of the calvarium with no acute fracture or aggressive lesion.  No paranasal sinus disease.  Mastoid air cells are clear.  Unremarkable appearance of the bilateral orbits.  Re- demonstration of heterogeneous high right parietal subdural collection without associated mass effect. This is essentially unchanged in greatest thickness and distribution as compared to prior CT and MRI.  Extra-axial collection overlying the left frontal region measures slightly greater as compared to the prior CT, previously 16 mm, and now 19 mm. Persistent mild mass  effect on the underlying brain. Unchanged appearance of right frontal CSF density collection.  No region of new or acute intracranial hemorrhage.  New region of hypodensity in the left temporoparietal region extending to the Coil matter.  Re- demonstration of encephalomalacic changes of the right temporoparietal region.  Unchanged configuration of the ventricular system.  Similar appearance of periventricular white matter changes, particularly overlying the left frontal horn, as well as the right hemisphere.  Dense intracranial atherosclerotic calcifications.  IMPRESSION: No new or acute hemorrhage. There is similar appearance of right parietal subdural collection (present on study of 1 year prior) without associated mass effect.   The left frontal hygroma measures slightly greater than on the comparison CT, previously 16 mm and now 19 mm. Configuration of the underlying brain is similar to prior. Also, similar appearance of right frontal hygroma.  New region of encephalomalacia of left temporal parietal brain, which appears chronic/remote. If there was concern for acute component, MR may be indicated.  Intracranial atherosclerosis.  Signed,  Dulcy Fanny. Earleen Newport, DO  [facility]  [facility]   Electronically Signed   By: Corrie Mckusick D.O.   On: 04/01/2014 15:10   Mr Angiogram Neck Wo Contrast  04/02/2014   CLINICAL DATA:  New onset of LEFT-sided weakness. Exact time of onset is uncertain, but likely greater than 24 hr duration. Chronic extra-axial fluid collections are reported. Stroke risk factors include diabetes, hypertension, hyperlipidemia, and history of previous stroke.  EXAM: MRI HEAD WITHOUT CONTRAST  MRA HEAD WITHOUT CONTRAST  MRA NECK WITHOUT CONTRAST  TECHNIQUE: Multiplanar, multiecho pulse sequences of the brain and surrounding structures were obtained without intravenous contrast. Angiographic images of the Circle of Willis were obtained using MRA technique without intravenous contrast. Angiographic images of the neck were obtained using MRA technique without intravenous contrast. Carotid stenosis measurements (when applicable) are obtained utilizing NASCET criteria, using the distal internal carotid diameter as the denominator.  COMPARISON:  CT head 04/01/2014. MR head 03/15/2013. MRI and MRA 12/06/2011  FINDINGS: MRI HEAD FINDINGS  The patient was unable to remain motionless for the exam. Small or subtle lesions could be overlooked. Overall exam diagnostic.  Areas of gyriform restricted diffusion affect the RIGHT frontal and RIGHT posterior frontal cortex in the parasagittal location, the consistent with acute infarction. Single focus of restricted diffusion more posteriorly  adjacent to the old hemorrhagic infarct. No other areas of restricted diffusion.  BILATERAL areas of cortical infarction, some which display chronic hemorrhage appears stable. Chronic of LEFT frontotemporoparietal subdural hygroma, CSF intensity on all pulse sequences, measures up to 19 mm, with moderate mass effect on the LEFT hemisphere, slightly increased in 2014. There is a much smaller RIGHT parietal collection, more complex, up to 7 mm thick without significant mass effect, likely representing combination of hygroma and chronic subdural hematoma, essentially stable from 2014.  Flow voids are maintained. No midline abnormality. No acute sinus or mastoid disease.  MRA HEAD FINDINGS  Widely patent internal carotid artery on the LEFT. 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 Proximal flow reducing lesion is discussed in the MRA Neck section. There is suspected 50% stenosis of the cavernous and supraclinoid ICA on the RIGHT, non flow reducing. Azygos LEFT ACA supplies both anterior cerebrals. No MCA stenosis or branch occlusion. Basilar artery widely patent with LEFT vertebral dominant. Mildly diseased V4 segment  distal RIGHT vertebral. No cerebellar branch occlusion. Mild irregularity both proximal posterior cerebral arteries.  MRA NECK FINDINGS  No priors for comparison. There is suspected flow reducing stenosis of the RIGHT ICA, estimated 75-90%. Distal right common carotid artery plaque narrows that vessel without definite ulceration. Unremarkable appearing LEFT carotid bifurcation. Both vertebrals are patent, with the LEFT dominant.  IMPRESSION: Multiple areas of restricted diffusion affecting the RIGHT anterior circulation consistent with acute infarction.  Slight increased size of the LEFT frontotemporoparietal subdural hygroma, now 19 mm thick. Stable chronic  mixed signal intensity RIGHT hemisphere extra-axial collection.  Diminished caliber of the RIGHT ICA compared to the LEFT, progressive since 2013, relates to an apparent flow reducing 75-90% stenosis at the RICA origin.   Electronically Signed   By: Rolla Flatten M.D.   On: 04/02/2014 11:33   [name]  04/02/2014   CLINICAL DATA:  New onset of LEFT-sided weakness. Exact time of onset is uncertain, but likely greater than 24 hr duration. Chronic extra-axial fluid collections are reported. Stroke risk factors include diabetes, hypertension, hyperlipidemia, and history of previous stroke.  EXAM: MRI HEAD WITHOUT CONTRAST  MRA HEAD WITHOUT CONTRAST  MRA NECK WITHOUT CONTRAST  TECHNIQUE: Multiplanar, multiecho pulse sequences of the brain and surrounding structures were obtained without intravenous contrast. Angiographic images of the Circle of Willis were obtained using MRA technique without intravenous contrast. Angiographic images of the neck were obtained using MRA technique without intravenous contrast. Carotid stenosis measurements (when applicable) are obtained utilizing NASCET criteria, using the distal internal carotid diameter as the denominator.  COMPARISON:  CT head 04/01/2014. MR head 03/15/2013. MRI and MRA 12/06/2011  FINDINGS: MRI HEAD FINDINGS  The patient was unable to remain motionless for the exam. Small or subtle lesions could be overlooked. Overall exam diagnostic.  Areas of gyriform restricted diffusion affect the RIGHT frontal and RIGHT posterior frontal cortex in the parasagittal location, the consistent with acute infarction. Single focus of restricted diffusion more posteriorly adjacent to the old hemorrhagic infarct. No other areas of restricted diffusion.  BILATERAL areas of cortical infarction, some which display chronic hemorrhage appears stable. Chronic of LEFT frontotemporoparietal subdural hygroma, CSF intensity on all pulse sequences, measures up to 19 mm, with moderate  mass effect on the LEFT hemisphere, slightly increased in 2014. There is a much smaller RIGHT parietal collection, more complex, up to 7 mm thick without significant mass effect, likely representing combination of hygroma and chronic subdural hematoma, essentially stable from 2014.  Flow voids are maintained. No midline abnormality. No acute sinus or mastoid disease.  MRA HEAD FINDINGS  Widely patent internal carotid artery on the LEFT. 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 Proximal flow reducing lesion is discussed in the MRA Neck section. There is suspected 50% stenosis of the cavernous and supraclinoid ICA on the RIGHT, non flow reducing. Azygos LEFT ACA supplies both anterior cerebrals. No MCA stenosis or branch occlusion. Basilar artery widely patent with LEFT vertebral dominant. Mildly diseased V4 segment distal RIGHT vertebral. No cerebellar branch occlusion. Mild irregularity both proximal posterior cerebral arteries.  MRA NECK FINDINGS  No priors for comparison. There is suspected flow reducing stenosis of the RIGHT ICA, estimated 75-90%. Distal right common carotid artery plaque narrows that vessel without definite ulceration. Unremarkable appearing LEFT carotid bifurcation. Both vertebrals are patent, with the LEFT dominant.  IMPRESSION:  Multiple areas of restricted diffusion affecting the RIGHT anterior circulation consistent with acute infarction.  Slight increased size of the LEFT frontotemporoparietal subdural hygroma, now 19 mm thick. Stable chronic mixed signal intensity RIGHT hemisphere extra-axial collection.  Diminished caliber of the RIGHT ICA compared to the LEFT, progressive since 2013, relates to an apparent flow reducing 75-90% stenosis at the RICA origin.   Electronically Signed   By: Rolla Flatten M.D.   On: 04/02/2014 11:33   Mr Jodene Nam  Head/brain Wo Cm  04/02/2014   CLINICAL DATA:  New onset of LEFT-sided weakness. Exact time of onset is uncertain, but likely greater than 24 hr duration. Chronic extra-axial fluid collections are reported. Stroke risk factors include diabetes, hypertension, hyperlipidemia, and history of previous stroke.  EXAM: MRI HEAD WITHOUT CONTRAST  MRA HEAD WITHOUT CONTRAST  MRA NECK WITHOUT CONTRAST  TECHNIQUE: Multiplanar, multiecho pulse sequences of the brain and surrounding structures were obtained without intravenous contrast. Angiographic images of the Circle of Willis were obtained using MRA technique without intravenous contrast. Angiographic images of the neck were obtained using MRA technique without intravenous contrast. Carotid stenosis measurements (when applicable) are obtained utilizing NASCET criteria, using the distal internal carotid diameter as the denominator.  COMPARISON:  CT head 04/01/2014. MR head 03/15/2013. MRI and MRA 12/06/2011  FINDINGS: MRI HEAD FINDINGS  The patient was unable to remain motionless for the exam. Small or subtle lesions could be overlooked. Overall exam diagnostic.  Areas of gyriform restricted diffusion affect the RIGHT frontal and RIGHT posterior frontal cortex in the parasagittal location, the consistent with acute infarction. Single focus of restricted diffusion more posteriorly adjacent to the old hemorrhagic infarct. No other areas of restricted diffusion.  BILATERAL areas of cortical infarction, some which display chronic hemorrhage appears stable. Chronic of LEFT frontotemporoparietal subdural hygroma, CSF intensity on all pulse sequences, measures up to 19 mm, with moderate mass effect on the LEFT hemisphere, slightly increased in 2014. There is a much smaller RIGHT parietal collection, more complex, up to 7 mm thick without significant mass effect, likely representing combination of hygroma and chronic subdural hematoma, essentially stable from 2014.  Flow voids are  maintained. No midline abnormality. No acute sinus or mastoid disease.  MRA HEAD FINDINGS  Widely patent internal carotid artery on the LEFT. 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 Proximal flow reducing lesion is discussed in the MRA Neck section. There is suspected 50% stenosis of the cavernous and supraclinoid ICA on the RIGHT, non flow reducing. Azygos LEFT ACA supplies both anterior cerebrals. No MCA stenosis or branch occlusion. Basilar artery widely patent with LEFT vertebral dominant. Mildly diseased V4 segment distal RIGHT vertebral. No cerebellar branch occlusion. Mild irregularity both proximal posterior cerebral arteries.  MRA NECK FINDINGS  No priors for comparison. There is suspected flow reducing stenosis of the RIGHT ICA, estimated 75-90%. Distal right common carotid artery plaque narrows that vessel without definite ulceration. Unremarkable appearing LEFT carotid bifurcation. Both vertebrals are patent, with the LEFT dominant.  IMPRESSION: Multiple areas of restricted diffusion affecting the RIGHT anterior circulation consistent with acute infarction.  Slight increased size of the LEFT frontotemporoparietal subdural hygroma, now 19 mm thick. Stable chronic mixed signal intensity RIGHT hemisphere extra-axial collection.  Diminished caliber of the RIGHT ICA compared to the LEFT, progressive since 2013, relates to an apparent flow reducing 75-90% stenosis at the  RICA origin.   Electronically Signed   By: Rolla Flatten M.D.   On: 04/02/2014 11:33    Microbiology: Recent Results (from the past 240 hour(s))  URINE CULTURE     Status: None   Collection Time    04/02/14 12:54 AM      Result Value Ref Range Status   Specimen Description URINE, CLEAN CATCH   Final   Special Requests NONE   Final   Culture  Setup Time     Final   Value: 04/02/2014  11:04     Performed at [facility]     Final   Value: >=100,000 COLONIES/ML     Performed at [facility]   Culture     Final   Value: [place]     Performed at [facility]   Report Status PENDING   Incomplete     Labs: Basic Metabolic Panel:  Recent Labs Lab 04/01/14 1541 04/01/14 1554 04/02/14 0249 04/03/14 0900  NA 144 144 143 145  K 4.7 4.4 4.5 4.2  CL 107 110 108 108  CO2 22  --  23 26  GLUCOSE 171* 174* 137* 138*  BUN 31* 31* 32* 29*  CREATININE 2.38* 2.50* 2.42* 2.49*  CALCIUM 9.1  --  9.3 9.2   Liver Function Tests:  Recent Labs Lab 04/01/14 1541  AST 15  ALT 6  ALKPHOS 61  BILITOT 0.4  PROT 8.1  ALBUMIN 3.6   No results found for this basename: LIPASE, AMYLASE,  in the last 168 hours No results found for this basename: AMMONIA,  in the last 168 hours CBC:  Recent Labs Lab 04/01/14 1541 04/01/14 1554 04/02/14 0249 04/03/14 0558  WBC 7.1  --  7.3 5.9  NEUTROABS 5.8  --   --   --   HGB 11.3* 12.9* 10.8* 12.4*  HCT 33.3* 38.0* 31.8* 37.1*  MCV 92.0  --  89.3 91.6  PLT 257  --  258 260   Cardiac Enzymes:  Recent Labs Lab 04/01/14 1540 04/01/14 2145 04/02/14 0249 04/02/14 1418  TROPONINI 0.82* 1.56* 1.89* 1.43*   BNP: BNP (last 3 results) No results found for this basename: PROBNP,  in the last 8760 hours CBG:  Recent Labs Lab 04/03/14 0747 04/03/14 1150 04/03/14 1656 04/03/14 2051 04/04/14 0812  GLUCAP 94 136* 138* 177* 161*       Signed:  Jocob Dambach A  [facility] 04/04/2014, 10:58 AM

## 2014-04-04 NOTE — Progress Notes (Signed)
Utilization review completed. Evertte Sones, RN, BSN. 

## 2014-04-04 NOTE — Progress Notes (Addendum)
[place] NOTE 04/04/2014  Patient:  [name], [name]  Account Number:  [id number] Admit date:  04/01/2014  Clinical Social Worker:  Adair Laundry  Date/time:  04/04/2014 03:44 PM  Clinical Social Work is seeking post-discharge placement for this patient at the following level of care:   SKILLED NURSING   (*CSW will update this form in Epic as items are completed)   04/04/2014  Patient/family provided with [place] Department of Clinical Social Work's list of facilities offering this level of care within the geographic area requested by the patient (or if unable, by the patient's family).  04/04/2014  Patient/family informed of their freedom to choose among providers that offer the needed level of care, that participate in Medicare, Medicaid or managed care program needed by the patient, have an available bed and are willing to accept the patient.  04/04/2014  Patient/family informed of [facility]' ownership interest in [facility], as well as of the fact that they are under no obligation to receive care at this facility.  PASARR submitted to EDS on 04/04/2014 PASARR number received on 04/04/2014  FL2 transmitted to all facilities in geographic area requested by pt/family on  04/04/2014 FL2 transmitted to all facilities within larger geographic area on   Patient informed that his/her managed care company has contracts with or will negotiate with  certain facilities, including the following:     Patient/family informed of bed offers received:  04/04/2014 Patient chooses bed at [facility] Physician recommends and patient chooses bed at    Patient to be transferred [facility]  on  04/05/2014 Patient to be transferred to facility by [facility] Patient and family notified of transfer on 04/05/2014 Name of family member notified:  Johney Frame  The following physician request were entered in  Epic:   Additional Comments:  [facility], [place]

## 2014-04-04 NOTE — Progress Notes (Signed)
Clinical Social Work Department BRIEF PSYCHOSOCIAL ASSESSMENT 04/04/2014  Patient:  [name], [name]     Account Number:  [id number]     Admit date:  04/01/2014  Clinical Social Worker:  Adair Laundry  Date/Time:  04/04/2014 03:20 PM  Referred by:  Physician  Date Referred:  04/04/2014 Referred for  SNF Placement   Other Referral:   Interview type:  Family Other interview type:   Spoke with pt wife and son at bedside. Later spoke with pt sister over the phone    PSYCHOSOCIAL DATA Living Status:  WIFE Admitted from facility:   Level of care:   Primary support name:  Merlin Golden [phone] Primary support relationship to patient:  CHILD, ADULT Degree of support available:   Pt has strong family support    CURRENT CONCERNS Current Concerns  Post-Acute Placement   Other Concerns:    SOCIAL WORK ASSESSMENT / PLAN CSW made aware that pt will likely not be approved by insurance for CIR. CSW visited pt room and spoke with pt wife and son at bedside. Pt son reported that pt was previously at [facility] and pt have very negative experience. Pt was at facility for less than one week and family decided to have him dc home with home health services. Pt son informed CSW that they would likely want this to be the plan again but he wanted to check with his sister.  CSW revisited pt room after 10 minutes. Pt son asked for CSW to please speak with his sister. CSW spoke with pt daughter over the phone. Pt daughter informed CSW that in order for pt to come home he would need to be able to do more for himself. Pt daughter concerned that too much care would fall on the shoulders of family and they will not be able to handle this. Pt daughter informed CSW that they would like SNF to be pt dc plan. CSW explained SNF referral process. Pt family agreeable to SNF referral being sent to all [facility] but did express that they did not want pt to dc to same facility as last time. CSW did also inform both  pt son and daugther that facility choice will need to be made relatively quickly in order to ensure that insurance authorization can be received. Pt family understanding of this.   Assessment/plan status:  Psychosocial Support/Ongoing Assessment of Needs Other assessment/ plan:   Information/referral to community resources:   SNF list to be provided with bed offers    PATIENT'S/FAMILY'S RESPONSE TO PLAN OF CARE: Pt family agreeable to [facility]        [place], [place]

## 2014-04-05 DIAGNOSIS — I6330 Cerebral infarction due to thrombosis of unspecified cerebral artery: Secondary | ICD-10-CM

## 2014-04-05 LAB — GLUCOSE, CAPILLARY
Glucose-Capillary: 127 mg/dL — ABNORMAL HIGH (ref 70–99)
Glucose-Capillary: 168 mg/dL — ABNORMAL HIGH (ref 70–99)

## 2014-04-05 MED ORDER — ATORVASTATIN CALCIUM 20 MG PO TABS: 20.0000 mg | ORAL_TABLET | Freq: Every day | ORAL | Status: DC

## 2014-04-05 MED ORDER — CEPHALEXIN 250 MG PO CAPS: 250.0000 mg | ORAL_CAPSULE | Freq: Two times a day (BID) | ORAL | Status: DC

## 2014-04-05 MED ORDER — CLOPIDOGREL BISULFATE 75 MG PO TABS: 75.0000 mg | ORAL_TABLET | Freq: Every day | ORAL | Status: DC

## 2014-04-05 MED ORDER — GLIPIZIDE 2.5 MG HALF TABLET
2.5000 mg | ORAL_TABLET | Freq: Every day | ORAL | Status: DC
Start: 2014-04-05 — End: 2014-05-17

## 2014-04-05 NOTE — Progress Notes (Signed)
CSW (Clinical Education officer, museum) received call from [facility] notifying that they had received insurance authorization. CSW prepared pt dc packet and placed with shadow chart. CSW arranged non-emergent ambulance transport. Pt, pt family, pt nurse, and facility informed. CSW signing off.  [place], [place]

## 2014-04-05 NOTE — Discharge Summary (Signed)
Physician Discharge Summary  [name] BZJ:[id number] DOB: 1928-05-06 DOA: 04/01/2014  PCP: Darrell Silversmith, NP  Admit date: 04/01/2014 Discharge date: 04/05/2014  Time spent: 35 minutes  Recommendations for Outpatient Follow-up:  1. Follow up with Neurology in 2 months.  2. Needs to follow up with cardio to consider outpatient stress test.  3. Please follow up urine culture.   Discharge Diagnoses:    Cerebral thrombosis with cerebral infarction   Elevated troponin, probably demand ischemia.    Ischemic heart disease   Diabetes mellitus type 2, insulin dependent   CKD (chronic kidney disease)   HTN (hypertension)   SDH (subdural hematoma)   Acute left-sided weakness   UTI (lower urinary tract infection)    Discharge Condition: Stable.   Diet recommendation: heart Healthy  Filed Weights   04/02/14 0300 04/03/14 0400 04/05/14 0400  Weight: 63.655 kg (140 lb 5.3 oz) 63.912 kg (140 lb 14.4 oz) 63.73 kg (140 lb 8 oz)    History of present illness:  [name] is a 80 y.o. male with past medical history of dementia, ischemic heart disease and history of SDH. Patient brought to the hospital because of left-sided weakness. There was no family members at bedside, patient is demented and confused, so the H&P obtained from the EDP notes. Apparently patient was in his urine of health until about 7:30 PM when he started to have left-sided weakness, his son helped him to get back to the bed, this is continues this morning so they brought him to the hospital for further evaluation. Patient is confused so no much of complaints.  In the ED he was found to have troponin of 0.82, urinalysis is consistent with UTI. Patient admitted to the hospital for further evaluation.      Hospital Course:  Acute Stroke:  -MRI; Multiple areas of restricted diffusion affecting the RIGHT anterior circulation consistent with acute infarction.  -Patient admitted to the hospital for further evaluation. Patient  with left hemiparesis, left extremity flaccid.  -CT scan done in the ED and showed no evidence of acute events.  -LDL 160, started on statins.  -On plavix for secondary stroke prevention.  -need continue PT.   UTI  -Patient started empirically on Rocephin, urinalysis consistent with UTI.  -urine culture pending. Gram negative rods.  -Continue with ceftriaxone, day 5/7. He will be discharge on keflex. Please follow urine culture results.   Bradycardia; hold metoprolol. If HR increase will need to resume lower dose metoprolol.   Elevated troponin  - continue aspirin, statin.  -Patient has old subdural hematoma, no change since last year, I think aspirin would be safe.  -Likely no intervention as this probably might be secondary to his CVA or elevated creatinine,  -troponin elevated. ECHO normal EF, and no wall motion abnormalities.  -Elevation of troponin related to demand ischemia in setting stroke, renal failure. outpatient stress test.    Diabetes mellitus  -will decrease glipizide to 2.5 mg daily. Monitor for hypoglycemia in setting of renal failure.  -Could use insulin sliding scale.   CKD stage III  -Creatinine is 2.38, this is around his baseline. Monitor while in the hospital.  -stable.   SDH  -Has chronic right-sided SDH, no mass effect, no changes since about a year ago.  -No active bleeding.    Procedures: ECHO:Left ventricle: The cavity size was normal. There was mild concentric hypertrophy. Systolic function was probably normal. Doppler parameters are consistent with abnormal left ventricular relaxation (grade 1 diastolic dysfunction). - Mitral  valve: There was mild regurgitation.    Doppler; Right: 60-79% internal carotid artery stenosis. Left: 1-39% ICA stenosis. Bilateral: Vertebral artery flow is antegrade.    Consultations:  Neurology  Discharge Exam: Filed Vitals:   04/05/14 0752  BP: 148/80  Pulse: 61  Temp: 98.4 F (36.9 C)  Resp: 15     General: Alert in no distress.  Cardiovascular: S 1, S 2 RRR Respiratory: CTA  Discharge Instructions You were cared for by a hospitalist during your hospital stay. If you have any questions about your discharge medications or the care you received while you were in the hospital after you are discharged, you can call the unit and asked to speak with the hospitalist on call if the hospitalist that took care of you is not available. Once you are discharged, your primary care physician will handle any further medical issues. Please note that NO REFILLS for any discharge medications will be authorized once you are discharged, as it is imperative that you return to your primary care physician (or establish a relationship with a primary care physician if you do not have one) for your aftercare needs so that they can reassess your need for medications and monitor your lab values.  Discharge Instructions   Diet - low sodium heart healthy    Complete by:  As directed      Increase activity slowly    Complete by:  As directed           Current Discharge Medication List    START taking these medications   Details  atorvastatin (LIPITOR) 20 MG tablet Take 1 tablet (20 mg total) by mouth daily at 6 PM. Qty: 30 tablet, Refills: 0    cephALEXin (KEFLEX) 250 MG capsule Take 1 capsule (250 mg total) by mouth 2 (two) times daily. Qty: 6 capsule, Refills: 0    clopidogrel (PLAVIX) 75 MG tablet Take 1 tablet (75 mg total) by mouth daily. Qty: 30 tablet, Refills: 0      CONTINUE these medications which have NOT CHANGED   Details  ENSURE (ENSURE) Take 1 Can by mouth daily.    glipiZIDE (GLUCOTROL) 10 MG tablet Take 10 mg by mouth 2 (two) times daily.    isosorbide mononitrate (IMDUR) 30 MG 24 hr tablet Take 30 mg by mouth daily.    omeprazole (PRILOSEC) 20 MG capsule Take 20 mg by mouth daily.      STOP taking these medications     metoprolol (LOPRESSOR) 50 MG tablet        No Known  Allergies Follow-up Information   Follow up with SETHI,PRAMOD, MD. Schedule an appointment as soon as possible for a visit in 2 months. (Stroke Clinic)    Specialties:  Neurology, Radiology   Contact information:   [place] [address] [phone]       Follow up with Darrell Silversmith, NP In 1 week.   Specialty:  Internal Medicine   Contact information:   [address] [place] [address] [phone]        The results of significant diagnostics from this hospitalization (including imaging, microbiology, ancillary and laboratory) are listed below for reference.    Significant Diagnostic Studies: Dg Chest 2 View  04/02/2014   CLINICAL DATA:  Stroke, patient unresponsive  EXAM: CHEST  2 VIEW  COMPARISON:  03/11/2013, 06/08/2012  FINDINGS: The heart size and vascular pattern are normal. Patient is status post previous CABG. No consolidation or effusion. 5  mm right upper lobe nodular opacity, not seen on prior study.  IMPRESSION: No acute cardiopulmonary process. 5 mm right upper lobe nodular opacity. This can be re-evaluated on anticipated followup chest radiographs. Alternatively a CT thorax could be considered.   Electronically Signed   By: Skipper Cliche M.D.   On: 04/02/2014 21:21   Ct Head Wo Contrast  04/01/2014   CLINICAL DATA:  80 year old male with left-sided weakness and pain.  EXAM: CT HEAD WITHOUT CONTRAST  TECHNIQUE: Contiguous axial images were obtained from the base of the skull through the vertex without intravenous contrast.  COMPARISON:  Head CT 03/30/2013, 03/11/2013.  MRI 03/15/2013  FINDINGS: Unremarkable appearance of the calvarium with no acute fracture or aggressive lesion.  No paranasal sinus disease.  Mastoid air cells are clear.  Unremarkable appearance of the bilateral orbits.  Re- demonstration of heterogeneous high right parietal subdural collection without associated mass effect. This is essentially unchanged in greatest thickness  and distribution as compared to prior CT and MRI.  Extra-axial collection overlying the left frontal region measures slightly greater as compared to the prior CT, previously 16 mm, and now 19 mm. Persistent mild mass effect on the underlying brain. Unchanged appearance of right frontal CSF density collection.  No region of new or acute intracranial hemorrhage.  New region of hypodensity in the left temporoparietal region extending to the Prevo matter.  Re- demonstration of encephalomalacic changes of the right temporoparietal region.  Unchanged configuration of the ventricular system.  Similar appearance of periventricular white matter changes, particularly overlying the left frontal horn, as well as the right hemisphere.  Dense intracranial atherosclerotic calcifications.  IMPRESSION: No new or acute hemorrhage. There is similar appearance of right parietal subdural collection (present on study of 1 year prior) without associated mass effect.  The left frontal hygroma measures slightly greater than on the comparison CT, previously 16 mm and now 19 mm. Configuration of the underlying brain is similar to prior. Also, similar appearance of right frontal hygroma.  New region of encephalomalacia of left temporal parietal brain, which appears chronic/remote. If there was concern for acute component, MR may be indicated.  Intracranial atherosclerosis.  Signed,  Dulcy Fanny. Earleen Newport, DO  [facility]  [facility]   Electronically Signed   By: Corrie Mckusick D.O.   On: 04/01/2014 15:10   Mr Angiogram Neck Wo Contrast  04/02/2014   CLINICAL DATA:  New onset of LEFT-sided weakness. Exact time of onset is uncertain, but likely greater than 24 hr duration. Chronic extra-axial fluid collections are reported. Stroke risk factors include diabetes, hypertension, hyperlipidemia, and history of previous stroke.  EXAM: MRI HEAD WITHOUT CONTRAST  MRA HEAD WITHOUT CONTRAST  MRA NECK WITHOUT  CONTRAST  TECHNIQUE: Multiplanar, multiecho pulse sequences of the brain and surrounding structures were obtained without intravenous contrast. Angiographic images of the Circle of Willis were obtained using MRA technique without intravenous contrast. Angiographic images of the neck were obtained using MRA technique without intravenous contrast. Carotid stenosis measurements (when applicable) are obtained utilizing NASCET criteria, using the distal internal carotid diameter as the denominator.  COMPARISON:  CT head 04/01/2014. MR head 03/15/2013. MRI and MRA 12/06/2011  FINDINGS: MRI HEAD FINDINGS  The patient was unable to remain motionless for the exam. Small or subtle lesions could be overlooked. Overall exam diagnostic.  Areas of gyriform restricted diffusion affect the RIGHT frontal and RIGHT posterior frontal cortex in the parasagittal location, the consistent with acute infarction. Single focus of  restricted diffusion more posteriorly adjacent to the old hemorrhagic infarct. No other areas of restricted diffusion.  BILATERAL areas of cortical infarction, some which display chronic hemorrhage appears stable. Chronic of LEFT frontotemporoparietal subdural hygroma, CSF intensity on all pulse sequences, measures up to 19 mm, with moderate mass effect on the LEFT hemisphere, slightly increased in 2014. There is a much smaller RIGHT parietal collection, more complex, up to 7 mm thick without significant mass effect, likely representing combination of hygroma and chronic subdural hematoma, essentially stable from 2014.  Flow voids are maintained. No midline abnormality. No acute sinus or mastoid disease.  MRA HEAD FINDINGS  Widely patent internal carotid artery on the LEFT. 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 Proximal flow reducing lesion is discussed in the MRA  Neck section. There is suspected 50% stenosis of the cavernous and supraclinoid ICA on the RIGHT, non flow reducing. Azygos LEFT ACA supplies both anterior cerebrals. No MCA stenosis or branch occlusion. Basilar artery widely patent with LEFT vertebral dominant. Mildly diseased V4 segment distal RIGHT vertebral. No cerebellar branch occlusion. Mild irregularity both proximal posterior cerebral arteries.  MRA NECK FINDINGS  No priors for comparison. There is suspected flow reducing stenosis of the RIGHT ICA, estimated 75-90%. Distal right common carotid artery plaque narrows that vessel without definite ulceration. Unremarkable appearing LEFT carotid bifurcation. Both vertebrals are patent, with the LEFT dominant.  IMPRESSION: Multiple areas of restricted diffusion affecting the RIGHT anterior circulation consistent with acute infarction.  Slight increased size of the LEFT frontotemporoparietal subdural hygroma, now 19 mm thick. Stable chronic mixed signal intensity RIGHT hemisphere extra-axial collection.  Diminished caliber of the RIGHT ICA compared to the LEFT, progressive since 2013, relates to an apparent flow reducing 75-90% stenosis at the RICA origin.   Electronically Signed   By: Rolla Flatten M.D.   On: 04/02/2014 11:33   [name]  04/02/2014   CLINICAL DATA:  New onset of LEFT-sided weakness. Exact time of onset is uncertain, but likely greater than 24 hr duration. Chronic extra-axial fluid collections are reported. Stroke risk factors include diabetes, hypertension, hyperlipidemia, and history of previous stroke.  EXAM: MRI HEAD WITHOUT CONTRAST  MRA HEAD WITHOUT CONTRAST  MRA NECK WITHOUT CONTRAST  TECHNIQUE: Multiplanar, multiecho pulse sequences of the brain and surrounding structures were obtained without intravenous contrast. Angiographic images of the Circle of Willis were obtained using MRA technique without intravenous contrast. Angiographic images of the neck were obtained using MRA  technique without intravenous contrast. Carotid stenosis measurements (when applicable) are obtained utilizing NASCET criteria, using the distal internal carotid diameter as the denominator.  COMPARISON:  CT head 04/01/2014. MR head 03/15/2013. MRI and MRA 12/06/2011  FINDINGS: MRI HEAD FINDINGS  The patient was unable to remain motionless for the exam. Small or subtle lesions could be overlooked. Overall exam diagnostic.  Areas of gyriform restricted diffusion affect the RIGHT frontal and RIGHT posterior frontal cortex in the parasagittal location, the consistent with acute infarction. Single focus of restricted diffusion more posteriorly adjacent to the old hemorrhagic infarct. No other areas of restricted diffusion.  BILATERAL areas of cortical infarction, some which display chronic hemorrhage appears stable. Chronic of LEFT frontotemporoparietal subdural hygroma, CSF intensity on all pulse sequences, measures up to 19 mm, with moderate mass effect on the LEFT hemisphere, slightly increased in 2014. There is a much  smaller RIGHT parietal collection, more complex, up to 7 mm thick without significant mass effect, likely representing combination of hygroma and chronic subdural hematoma, essentially stable from 2014.  Flow voids are maintained. No midline abnormality. No acute sinus or mastoid disease.  MRA HEAD FINDINGS  Widely patent internal carotid artery on the LEFT. 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 Proximal flow reducing lesion is discussed in the MRA Neck section. There is suspected 50% stenosis of the cavernous and supraclinoid ICA on the RIGHT, non flow reducing. Azygos LEFT ACA supplies both anterior cerebrals. No MCA stenosis or branch occlusion. Basilar artery widely patent with LEFT vertebral dominant. Mildly diseased V4 segment distal RIGHT  vertebral. No cerebellar branch occlusion. Mild irregularity both proximal posterior cerebral arteries.  MRA NECK FINDINGS  No priors for comparison. There is suspected flow reducing stenosis of the RIGHT ICA, estimated 75-90%. Distal right common carotid artery plaque narrows that vessel without definite ulceration. Unremarkable appearing LEFT carotid bifurcation. Both vertebrals are patent, with the LEFT dominant.  IMPRESSION: Multiple areas of restricted diffusion affecting the RIGHT anterior circulation consistent with acute infarction.  Slight increased size of the LEFT frontotemporoparietal subdural hygroma, now 19 mm thick. Stable chronic mixed signal intensity RIGHT hemisphere extra-axial collection.  Diminished caliber of the RIGHT ICA compared to the LEFT, progressive since 2013, relates to an apparent flow reducing 75-90% stenosis at the RICA origin.   Electronically Signed   By: Rolla Flatten M.D.   On: 04/02/2014 11:33   Mr Jodene Nam Head/brain Wo Cm  04/02/2014   CLINICAL DATA:  New onset of LEFT-sided weakness. Exact time of onset is uncertain, but likely greater than 24 hr duration. Chronic extra-axial fluid collections are reported. Stroke risk factors include diabetes, hypertension, hyperlipidemia, and history of previous stroke.  EXAM: MRI HEAD WITHOUT CONTRAST  MRA HEAD WITHOUT CONTRAST  MRA NECK WITHOUT CONTRAST  TECHNIQUE: Multiplanar, multiecho pulse sequences of the brain and surrounding structures were obtained without intravenous contrast. Angiographic images of the Circle of Willis were obtained using MRA technique without intravenous contrast. Angiographic images of the neck were obtained using MRA technique without intravenous contrast. Carotid stenosis measurements (when applicable) are obtained utilizing NASCET criteria, using the distal internal carotid diameter as the denominator.  COMPARISON:  CT head 04/01/2014. MR head 03/15/2013. MRI and MRA 12/06/2011  FINDINGS: MRI HEAD FINDINGS   The patient was unable to remain motionless for the exam. Small or subtle lesions could be overlooked. Overall exam diagnostic.  Areas of gyriform restricted diffusion affect the RIGHT frontal and RIGHT posterior frontal cortex in the parasagittal location, the consistent with acute infarction. Single focus of restricted diffusion more posteriorly adjacent to the old hemorrhagic infarct. No other areas of restricted diffusion.  BILATERAL areas of cortical infarction, some which display chronic hemorrhage appears stable. Chronic of LEFT frontotemporoparietal subdural hygroma, CSF intensity on all pulse sequences, measures up to 19 mm, with moderate mass effect on the LEFT hemisphere, slightly increased in 2014. There is a much smaller RIGHT parietal collection, more complex, up to 7 mm thick without significant mass effect, likely representing combination of hygroma and chronic subdural hematoma, essentially stable from 2014.  Flow voids are maintained. No midline abnormality. No acute sinus or mastoid disease.  MRA HEAD FINDINGS  Widely patent internal carotid artery on the LEFT. 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 Proximal flow reducing lesion is discussed in the MRA Neck section. There is suspected 50% stenosis of the cavernous and supraclinoid ICA on the RIGHT, non flow reducing. Azygos LEFT ACA supplies both anterior cerebrals. No MCA stenosis or branch occlusion. Basilar artery widely patent with LEFT vertebral dominant. Mildly diseased V4 segment distal RIGHT vertebral. No cerebellar branch occlusion. Mild irregularity both proximal posterior cerebral arteries.  MRA NECK FINDINGS  No priors for comparison. There is suspected flow reducing stenosis of the RIGHT ICA, estimated 75-90%. Distal right common carotid artery plaque narrows that vessel without  definite ulceration. Unremarkable appearing LEFT carotid bifurcation. Both vertebrals are patent, with the LEFT dominant.  IMPRESSION: Multiple areas of restricted diffusion affecting the RIGHT anterior circulation consistent with acute infarction.  Slight increased size of the LEFT frontotemporoparietal subdural hygroma, now 19 mm thick. Stable chronic mixed signal intensity RIGHT hemisphere extra-axial collection.  Diminished caliber of the RIGHT ICA compared to the LEFT, progressive since 2013, relates to an apparent flow reducing 75-90% stenosis at the RICA origin.   Electronically Signed   By: Rolla Flatten M.D.   On: 04/02/2014 11:33    Microbiology: Recent Results (from the past 240 hour(s))  URINE CULTURE     Status: None   Collection Time    04/02/14 12:54 AM      Result Value Ref Range Status   Specimen Description URINE, CLEAN CATCH   Final   Special Requests NONE   Final   Culture  Setup Time     Final   Value: 04/02/2014 11:04     Performed at [facility]     Final   Value: >=100,000 COLONIES/ML     Performed at [facility]   Culture     Final   Value: [place]     Performed at [facility]   Report Status PENDING   Incomplete     Labs: Basic Metabolic Panel:  Recent Labs Lab 04/01/14 1541 04/01/14 1554 04/02/14 0249 04/03/14 0900  NA 144 144 143 145  K 4.7 4.4 4.5 4.2  CL 107 110 108 108  CO2 22  --  23 26  GLUCOSE 171* 174* 137* 138*  BUN 31* 31* 32* 29*  CREATININE 2.38* 2.50* 2.42* 2.49*  CALCIUM 9.1  --  9.3 9.2   Liver Function Tests:  Recent Labs Lab 04/01/14 1541  AST 15  ALT 6  ALKPHOS 61  BILITOT 0.4  PROT 8.1  ALBUMIN 3.6   No results found for this basename: LIPASE, AMYLASE,  in the last 168 hours No results found for this basename: AMMONIA,  in the last 168 hours CBC:  Recent Labs Lab 04/01/14 1541 04/01/14 1554 04/02/14 0249 04/03/14 0558  WBC 7.1  --  7.3 5.9  NEUTROABS 5.8   --   --   --   HGB 11.3* 12.9* 10.8* 12.4*  HCT 33.3* 38.0* 31.8* 37.1*  MCV 92.0  --  89.3 91.6  PLT 257  --  258 260   Cardiac Enzymes:  Recent Labs Lab 04/01/14 1540 04/01/14 2145 04/02/14 0249 04/02/14 1418  TROPONINI 0.82* 1.56* 1.89* 1.43*   BNP: BNP (last 3 results) No results found for this basename: PROBNP,  in the last 8760 hours CBG:  Recent Labs Lab 04/04/14 0812 04/04/14 1155 04/04/14  1717 04/04/14 2022 04/05/14 0755  GLUCAP 161* 185* 106* 169* 127*       Signed:  Regalado, Belkys A  [facility] 04/05/2014, 10:43 AM

## 2014-04-05 NOTE — Progress Notes (Signed)
Report called to [facility] at [facility], all questions answered.

## 2014-04-05 NOTE — Progress Notes (Signed)
Occupational Therapy Treatment Patient Details Name: [name] MRN: [id number] DOB: February 19, 1928 Today's Date: 04/05/2014    History of present illness [name] is a 80 y.o. male with past medical history of dementia, ischemic heart disease and history of SDH. Patient brought to the hospital because of left-sided weakness. CT of his head shows bilateral posterior quadrant infarcts which I feel are most consistent with bilateral posterior watershed chronic infarcts   OT comments  Patient found supine in bed. Patient awake/alert. Patient disoriented to place, time, and situation. Therapist worked on orienting patient. Therapist then engaged patient in LUE PROM exercises, see below for exercises completed. During these exercises, patient with complaints of pain, but unable to rate pain on a scale. Through faces, patient's pain was a 6 in LUE. Therapist monitored pain, limited activity with patient's tolerance, and re-positioned patient as appropriate. Therapist assisted with re-positioning patient in bed for comfort, placing pillow under LUE as well. Recommending CIR for intensive rehabilitation to help improve patient's overall independence and quality of life.     Follow Up Recommendations  CIR;Supervision/Assistance - 24 hour    Equipment Recommendations  Other (comment) (defer to next venue of care)    Recommendations for Other Services Rehab consult    Precautions / Restrictions Precautions Precautions: Fall Precaution Comments: left hemiplegia, left inattention. right gaze preference, aphasia Restrictions Weight Bearing Restrictions: No              ADL Overall ADL's : Needs assistance/impaired General ADL Comments: No ADL completed during this OT treatment session                Cognition   Behavior During Therapy: Flat affect Overall Cognitive Status: Impaired/Different from baseline Area of Impairment: Orientation;Attention;Memory;Following  commands;Safety/judgement;Awareness;Problem solving Orientation Level: Disoriented to;Place;Time;Situation Current Attention Level: Sustained Memory: Decreased short-term memory  Following Commands: Follows one step commands inconsistently Safety/Judgement: Decreased awareness of deficits;Decreased awareness of safety Awareness: Intellectual Problem Solving: Slow processing;Decreased initiation;Difficulty sequencing;Requires verbal cues;Requires tactile cues General Comments: Patient with baseline dementia. Pt currently does not demonstrate awareness of current deficits with hemiplegia      Exercises General Exercises - Upper Extremity Shoulder Flexion: PROM;Supine;Left Elbow Flexion: PROM;Supine;Left Elbow Extension: PROM;Left;Supine Wrist Flexion: PROM;Left;Supine Wrist Extension: PROM;Left;Supine           Pertinent Vitals/ Pain       Pain Assessment: Faces Faces Pain Scale: Hurts even more Pain Location: LUE during AROM Pain Descriptors / Indicators: Grimacing Pain Intervention(s): Monitored during session;Limited activity within patient's tolerance;Repositioned         Frequency Min 3X/week     Progress Toward Goals  OT Goals(current goals can now be found in the care plan section)  Progress towards OT goals: Progressing toward goals  Acute Rehab OT Goals Patient Stated Goal: None stated OT Goal Formulation: Patient unable to participate in goal setting  Plan Discharge plan remains appropriate          Activity Tolerance Patient limited by pain   Patient Left in bed;with bed alarm set (with LUE positioned on pillow)        Time: 1415-1430 OT Time Calculation (min): 15 min  Charges: OT General Charges $OT Visit: 1 Procedure OT Treatments $Therapeutic Exercise: 8-22 mins  [name] , MS, OTR/L, CLT 04/05/2014, 2:38 PM

## 2014-04-05 NOTE — Progress Notes (Signed)
Rehab admissions - Evaluated for possible admission.  Noted patient has a SNF bed and should be discharged soon.  Agree with plan for SNF.  Patient has [employer] and it is highly unlikely that I could have gotten authorization for an acute inpatient rehab admission.  Call me for questions.  #[phone]

## 2014-04-05 NOTE — Progress Notes (Signed)
CSW (Clinical Education officer, museum) notified facility that pt is ready for dc. They are still awaiting insurance authorization.   [name], Lydia

## 2014-04-11 LAB — URINE CULTURE: Colony Count: >=100000

## 2014-05-10 ENCOUNTER — Ambulatory Visit: Payer: Medicare Other | Admitting: Neurology

## 2014-05-10 ENCOUNTER — Telehealth: Payer: Self-pay | Admitting: *Deleted

## 2014-05-10 NOTE — Telephone Encounter (Signed)
Calling patient to r/s appointment, left message to call the office back and r/s appointment.

## 2014-05-13 ENCOUNTER — Inpatient Hospital Stay (HOSPITAL_COMMUNITY): Payer: Medicare PPO

## 2014-05-13 ENCOUNTER — Emergency Department (HOSPITAL_COMMUNITY): Payer: Medicare PPO

## 2014-05-13 ENCOUNTER — Encounter (HOSPITAL_COMMUNITY): Payer: Self-pay | Admitting: Emergency Medicine

## 2014-05-13 ENCOUNTER — Ambulatory Visit: Payer: Medicare Other | Admitting: Neurology

## 2014-05-13 ENCOUNTER — Inpatient Hospital Stay (HOSPITAL_COMMUNITY)
Admission: EM | Admit: 2014-05-13 | Discharge: 2014-05-17 | DRG: 065 | Disposition: A | Discharge status: Hospice | Payer: Medicare PPO | Attending: Internal Medicine | Admitting: Internal Medicine

## 2014-05-13 DIAGNOSIS — E7800 Pure hypercholesterolemia, unspecified: Secondary | ICD-10-CM | POA: Diagnosis present

## 2014-05-13 DIAGNOSIS — D649 Anemia, unspecified: Secondary | ICD-10-CM | POA: Diagnosis present

## 2014-05-13 DIAGNOSIS — R059 Cough, unspecified: Secondary | ICD-10-CM

## 2014-05-13 DIAGNOSIS — S065X9A Traumatic subdural hemorrhage with loss of consciousness of unspecified duration, initial encounter: Secondary | ICD-10-CM | POA: Diagnosis present

## 2014-05-13 DIAGNOSIS — Z7189 Other specified counseling: Secondary | ICD-10-CM

## 2014-05-13 DIAGNOSIS — Z515 Encounter for palliative care: Secondary | ICD-10-CM

## 2014-05-13 DIAGNOSIS — E119 Type 2 diabetes mellitus without complications: Secondary | ICD-10-CM

## 2014-05-13 DIAGNOSIS — M6289 Other specified disorders of muscle: Secondary | ICD-10-CM

## 2014-05-13 DIAGNOSIS — Z7902 Long term (current) use of antithrombotics/antiplatelets: Secondary | ICD-10-CM | POA: Diagnosis not present

## 2014-05-13 DIAGNOSIS — I4891 Unspecified atrial fibrillation: Secondary | ICD-10-CM | POA: Diagnosis present

## 2014-05-13 DIAGNOSIS — Z794 Long term (current) use of insulin: Secondary | ICD-10-CM | POA: Diagnosis not present

## 2014-05-13 DIAGNOSIS — I129 Hypertensive chronic kidney disease with stage 1 through stage 4 chronic kidney disease, or unspecified chronic kidney disease: Secondary | ICD-10-CM | POA: Diagnosis present

## 2014-05-13 DIAGNOSIS — N189 Chronic kidney disease, unspecified: Secondary | ICD-10-CM | POA: Diagnosis present

## 2014-05-13 DIAGNOSIS — E870 Hyperosmolality and hypernatremia: Secondary | ICD-10-CM | POA: Diagnosis present

## 2014-05-13 DIAGNOSIS — R05 Cough: Secondary | ICD-10-CM

## 2014-05-13 DIAGNOSIS — N184 Chronic kidney disease, stage 4 (severe): Secondary | ICD-10-CM | POA: Diagnosis present

## 2014-05-13 DIAGNOSIS — I63032 Cerebral infarction due to thrombosis of left carotid artery: Secondary | ICD-10-CM

## 2014-05-13 DIAGNOSIS — Z79899 Other long term (current) drug therapy: Secondary | ICD-10-CM

## 2014-05-13 DIAGNOSIS — E785 Hyperlipidemia, unspecified: Secondary | ICD-10-CM | POA: Diagnosis present

## 2014-05-13 DIAGNOSIS — N390 Urinary tract infection, site not specified: Secondary | ICD-10-CM

## 2014-05-13 DIAGNOSIS — I6200 Nontraumatic subdural hemorrhage, unspecified: Secondary | ICD-10-CM

## 2014-05-13 DIAGNOSIS — R627 Adult failure to thrive: Secondary | ICD-10-CM | POA: Diagnosis present

## 2014-05-13 DIAGNOSIS — F0390 Unspecified dementia without behavioral disturbance: Secondary | ICD-10-CM | POA: Diagnosis present

## 2014-05-13 DIAGNOSIS — I2510 Atherosclerotic heart disease of native coronary artery without angina pectoris: Secondary | ICD-10-CM | POA: Diagnosis present

## 2014-05-13 DIAGNOSIS — S065XAA Traumatic subdural hemorrhage with loss of consciousness status unknown, initial encounter: Secondary | ICD-10-CM | POA: Diagnosis present

## 2014-05-13 DIAGNOSIS — I10 Essential (primary) hypertension: Secondary | ICD-10-CM | POA: Diagnosis present

## 2014-05-13 DIAGNOSIS — Z951 Presence of aortocoronary bypass graft: Secondary | ICD-10-CM | POA: Diagnosis not present

## 2014-05-13 DIAGNOSIS — I259 Chronic ischemic heart disease, unspecified: Secondary | ICD-10-CM

## 2014-05-13 DIAGNOSIS — K219 Gastro-esophageal reflux disease without esophagitis: Secondary | ICD-10-CM | POA: Diagnosis present

## 2014-05-13 DIAGNOSIS — I63321 Cerebral infarction due to thrombosis of right anterior cerebral artery: Secondary | ICD-10-CM | POA: Diagnosis present

## 2014-05-13 DIAGNOSIS — R4701 Aphasia: Secondary | ICD-10-CM | POA: Diagnosis present

## 2014-05-13 DIAGNOSIS — Z66 Do not resuscitate: Secondary | ICD-10-CM | POA: Diagnosis present

## 2014-05-13 DIAGNOSIS — I639 Cerebral infarction, unspecified: Secondary | ICD-10-CM | POA: Diagnosis present

## 2014-05-13 DIAGNOSIS — R531 Weakness: Secondary | ICD-10-CM | POA: Diagnosis present

## 2014-05-13 DIAGNOSIS — R1314 Dysphagia, pharyngoesophageal phase: Secondary | ICD-10-CM

## 2014-05-13 LAB — COMPREHENSIVE METABOLIC PANEL
ALBUMIN: 3 g/dL — AB (ref 3.5–5.2)
ALT: 10 U/L (ref 0–53)
ANION GAP: 13 (ref 5–15)
AST: 18 U/L (ref 0–37)
Alkaline Phosphatase: 68 U/L (ref 39–117)
BUN: 29 mg/dL — AB (ref 6–23)
CHLORIDE: 112 meq/L (ref 96–112)
CO2: 23 mEq/L (ref 19–32)
Calcium: 9.3 mg/dL (ref 8.4–10.5)
Creatinine, Ser: 2.05 mg/dL — ABNORMAL HIGH (ref 0.50–1.35)
GFR calc Af Amer: 33 mL/min — ABNORMAL LOW (ref >90)
GFR calc non Af Amer: 28 mL/min — ABNORMAL LOW (ref >90)
Glucose, Bld: 101 mg/dL — ABNORMAL HIGH (ref 70–99)
Potassium: 4.8 mEq/L (ref 3.7–5.3)
Sodium: 148 mEq/L — ABNORMAL HIGH (ref 137–147)
TOTAL PROTEIN: 7.2 g/dL (ref 6.0–8.3)
Total Bilirubin: 0.3 mg/dL (ref 0.3–1.2)

## 2014-05-13 LAB — DIFFERENTIAL
BASOS ABS: 0 10*3/uL (ref 0.0–0.1)
BASOS PCT: 1 % (ref 0–1)
EOS ABS: 0.1 10*3/uL (ref 0.0–0.7)
Eosinophils Relative: 1 % (ref 0–5)
Lymphocytes Relative: 20 % (ref 12–46)
Lymphs Abs: 1.6 10*3/uL (ref 0.7–4.0)
MONOS PCT: 4 % (ref 3–12)
Monocytes Absolute: 0.3 10*3/uL (ref 0.1–1.0)
NEUTROS ABS: 5.7 10*3/uL (ref 1.7–7.7)
Neutrophils Relative %: 74 % (ref 43–77)

## 2014-05-13 LAB — RAPID URINE DRUG SCREEN, HOSP PERFORMED
AMPHETAMINES: NOT DETECTED
BENZODIAZEPINES: NOT DETECTED
Barbiturates: NOT DETECTED
Cocaine: NOT DETECTED
OPIATES: NOT DETECTED
TETRAHYDROCANNABINOL: NOT DETECTED

## 2014-05-13 LAB — URINE MICROSCOPIC-ADD ON

## 2014-05-13 LAB — URINALYSIS, ROUTINE W REFLEX MICROSCOPIC
Bilirubin Urine: NEGATIVE
GLUCOSE, UA: 100 mg/dL — AB
KETONES UR: NEGATIVE mg/dL
Nitrite: NEGATIVE
PROTEIN: 30 mg/dL — AB
Specific Gravity, Urine: 1.014 (ref 1.005–1.030)
Urobilinogen, UA: 0.2 mg/dL (ref 0.0–1.0)
pH: 5.5 (ref 5.0–8.0)

## 2014-05-13 LAB — GLUCOSE, CAPILLARY: GLUCOSE-CAPILLARY: 99 mg/dL (ref 70–99)

## 2014-05-13 LAB — CBC
HCT: 32.7 % — ABNORMAL LOW (ref 39.0–52.0)
HEMOGLOBIN: 10.5 g/dL — AB (ref 13.0–17.0)
MCH: 30.6 pg (ref 26.0–34.0)
MCHC: 32.1 g/dL (ref 30.0–36.0)
MCV: 95.3 fL (ref 78.0–100.0)
Platelets: 289 10*3/uL (ref 150–400)
RBC: 3.43 MIL/uL — ABNORMAL LOW (ref 4.22–5.81)
RDW: 14.3 % (ref 11.5–15.5)
WBC: 7.7 10*3/uL (ref 4.0–10.5)

## 2014-05-13 LAB — I-STAT CHEM 8, ED
BUN: 32 mg/dL — ABNORMAL HIGH (ref 6–23)
CHLORIDE: 115 meq/L — AB (ref 96–112)
Calcium, Ion: 1.19 mmol/L (ref 1.13–1.30)
Creatinine, Ser: 2.2 mg/dL — ABNORMAL HIGH (ref 0.50–1.35)
Glucose, Bld: 103 mg/dL — ABNORMAL HIGH (ref 70–99)
HEMATOCRIT: 36 % — AB (ref 39.0–52.0)
Hemoglobin: 12.2 g/dL — ABNORMAL LOW (ref 13.0–17.0)
Potassium: 4.6 mEq/L (ref 3.7–5.3)
Sodium: 147 mEq/L (ref 137–147)
TCO2: 24 mmol/L (ref 0–100)

## 2014-05-13 LAB — CBG MONITORING, ED: GLUCOSE-CAPILLARY: 87 mg/dL (ref 70–99)

## 2014-05-13 LAB — PROTIME-INR
INR: 1.13 (ref 0.00–1.49)
Prothrombin Time: 14.6 seconds (ref 11.6–15.2)

## 2014-05-13 LAB — I-STAT TROPONIN, ED: TROPONIN I, POC: 0.03 ng/mL (ref 0.00–0.08)

## 2014-05-13 LAB — APTT: APTT: 34 s (ref 24–37)

## 2014-05-13 LAB — VALPROIC ACID LEVEL: Valproic Acid Lvl: 23 ug/mL — ABNORMAL LOW (ref 50.0–100.0)

## 2014-05-13 LAB — ETHANOL

## 2014-05-13 MED ORDER — CEFTRIAXONE SODIUM IN DEXTROSE 20 MG/ML IV SOLN
1.0000 g | INTRAVENOUS | Status: DC
  Administered 2014-05-14 – 2014-05-15 (×2): 1 g via INTRAVENOUS
  Filled 2014-05-13 (×4): qty 50

## 2014-05-13 MED ORDER — CEFAZOLIN SODIUM 1-5 GM-% IV SOLN
1.0000 g | Freq: Once | INTRAVENOUS | Status: AC
  Administered 2014-05-13: 1 g via INTRAVENOUS
  Filled 2014-05-13: qty 50

## 2014-05-13 MED ORDER — GLIPIZIDE 5 MG PO TABS: 10.0000 mg | ORAL_TABLET | Freq: Two times a day (BID) | ORAL | Status: DC

## 2014-05-13 MED ORDER — ACETAMINOPHEN 325 MG PO TABS: 650.0000 mg | ORAL_TABLET | Freq: Four times a day (QID) | ORAL | Status: DC | PRN

## 2014-05-13 MED ORDER — ACETAMINOPHEN 650 MG RE SUPP: 650.0000 mg | Freq: Four times a day (QID) | RECTAL | Status: DC | PRN

## 2014-05-13 MED ORDER — SODIUM CHLORIDE 0.9 % IV SOLN
INTRAVENOUS | Status: DC
  Administered 2014-05-13: 1000 mL via INTRAVENOUS

## 2014-05-13 MED ORDER — SODIUM CHLORIDE 0.9 % IV SOLN: INTRAVENOUS | Status: DC

## 2014-05-13 MED ORDER — ONDANSETRON HCL 4 MG/2ML IJ SOLN: 4.0000 mg | Freq: Four times a day (QID) | INTRAMUSCULAR | Status: DC | PRN

## 2014-05-13 MED ORDER — SODIUM CHLORIDE 0.9 % IV BOLUS (SEPSIS)
500.0000 mL | Freq: Once | INTRAVENOUS | Status: AC
  Administered 2014-05-13: 500 mL via INTRAVENOUS

## 2014-05-13 MED ORDER — ASPIRIN 300 MG RE SUPP
300.0000 mg | Freq: Every day | RECTAL | Status: DC
  Administered 2014-05-14 – 2014-05-16 (×3): 300 mg via RECTAL
  Filled 2014-05-13 (×3): qty 1

## 2014-05-13 MED ORDER — HEPARIN SODIUM (PORCINE) 5000 UNIT/ML IJ SOLN
5000.0000 [IU] | Freq: Three times a day (TID) | INTRAMUSCULAR | Status: DC
  Administered 2014-05-13 – 2014-05-16 (×8): 5000 [IU] via SUBCUTANEOUS
  Filled 2014-05-13 (×8): qty 1

## 2014-05-13 MED ORDER — STROKE: EARLY STAGES OF RECOVERY BOOK
Freq: Once | Status: AC
  Administered 2014-05-15: 1
  Filled 2014-05-13: qty 1

## 2014-05-13 MED ORDER — ASPIRIN 300 MG RE SUPP
300.0000 mg | Freq: Once | RECTAL | Status: AC
  Administered 2014-05-13: 300 mg via RECTAL
  Filled 2014-05-13: qty 1

## 2014-05-13 MED ORDER — ONDANSETRON HCL 4 MG PO TABS: 4.0000 mg | ORAL_TABLET | Freq: Four times a day (QID) | ORAL | Status: DC | PRN

## 2014-05-13 MED ORDER — SODIUM CHLORIDE 0.9 % IJ SOLN
3.0000 mL | Freq: Two times a day (BID) | INTRAMUSCULAR | Status: DC
  Administered 2014-05-13 – 2014-05-16 (×4): 3 mL via INTRAVENOUS

## 2014-05-13 MED ORDER — VALPROATE SODIUM 500 MG/5ML IV SOLN
250.0000 mg | Freq: Two times a day (BID) | INTRAVENOUS | Status: DC
  Administered 2014-05-14: 250 mg via INTRAVENOUS
  Filled 2014-05-13 (×3): qty 2.5

## 2014-05-13 MED ORDER — GUAIFENESIN-DM 100-10 MG/5ML PO SYRP
5.0000 mL | ORAL_SOLUTION | ORAL | Status: DC | PRN
Start: 2014-05-13 — End: 2014-05-17

## 2014-05-13 MED ORDER — PANTOPRAZOLE SODIUM 40 MG IV SOLR
40.0000 mg | Freq: Two times a day (BID) | INTRAVENOUS | Status: DC
  Administered 2014-05-13 – 2014-05-16 (×6): 40 mg via INTRAVENOUS
  Filled 2014-05-13 (×6): qty 40

## 2014-05-13 MED ORDER — INSULIN ASPART 100 UNIT/ML ~~LOC~~ SOLN
0.0000 [IU] | SUBCUTANEOUS | Status: DC
  Administered 2014-05-14: 3 [IU] via SUBCUTANEOUS
  Administered 2014-05-15: 1 [IU] via SUBCUTANEOUS
  Administered 2014-05-15: 2 [IU] via SUBCUTANEOUS

## 2014-05-13 MED ORDER — POLYETHYLENE GLYCOL 3350 17 G PO PACK: 17.0000 g | PACK | Freq: Every day | ORAL | Status: DC | PRN

## 2014-05-13 NOTE — Progress Notes (Signed)
MEDICATION RELATED CONSULT NOTE - INITIAL   Pharmacy Consult for IV depacon Indication: seizure prophylaxis  No Known Allergies  Patient Measurements: Height:  (unable to answer) Weight:  (unable to answer.) Adjusted Body Weight:   Vital Signs: Temp: 98.9 F (37.2 C) (11/13 1618) Temp Source: Oral (11/13 1618) BP: 153/86 mmHg (11/13 1603) Pulse Rate: 89 (11/13 1603) Intake/Output from previous day:   Intake/Output from this shift:    Labs:  Recent Labs  05/13/14 1120 05/13/14 1150  WBC 7.7  --   HGB 10.5* 12.2*  HCT 32.7* 36.0*  PLT 289  --   APTT 34  --   CREATININE 2.05* 2.20*  ALBUMIN 3.0*  --   PROT 7.2  --   AST 18  --   ALT 10  --   ALKPHOS 68  --   BILITOT 0.3  --    CrCl cannot be calculated (Unknown ideal weight.).   Microbiology: No results found for this or any previous visit (from the past 720 hour(s)).  Medical History: Past Medical History  Diagnosis Date  . Coronary artery disease     post CABG in 1997 with abnormal nuclear stress test in 2007 which did demonstrate mild reversible ischemia and has been managed medically.   . Diabetes mellitus     type 2  . Hypertension   . Hyperlipidemia   . Hypercholesterolemia   . Atypical chest pain   . Hypernatremia   . History of hypoglycemia   . Syncope   . CVA (cerebral infarction)   . Allergy   . History of chicken pox   . Acute on chronic renal insufficiency   . CKD (chronic kidney disease)   . GERD (gastroesophageal reflux disease)   . H/O hiatal hernia     Medications:  Prescriptions prior to admission  Medication Sig Dispense Refill Last Dose  . atorvastatin (LIPITOR) 20 MG tablet Take 1 tablet (20 mg total) by mouth daily at 6 PM. 30 tablet 0 05/12/2014 at Unknown time  . clopidogrel (PLAVIX) 75 MG tablet Take 1 tablet (75 mg total) by mouth daily. (Patient taking differently: Take 75 mg by mouth every evening. ) 30 tablet 0 05/12/2014 at Unknown time  . divalproex (DEPAKOTE  SPRINKLE) 125 MG capsule Take 125 mg by mouth 2 (two) times daily.   05/12/2014 at Unknown time  . ENSURE PLUS (ENSURE PLUS) LIQD Take 237 mLs by mouth daily.   05/12/2014 at Unknown time  . glipiZIDE (GLUCOTROL) 10 MG tablet Take 10 mg by mouth 2 (two) times daily before a meal.   05/12/2014 at Unknown time  . isosorbide mononitrate (IMDUR) 30 MG 24 hr tablet Take 30 mg by mouth every morning.    05/12/2014 at Unknown time  . Melatonin 3 MG TABS Take 6 mg by mouth at bedtime.   05/12/2014 at Unknown time  . omeprazole (PRILOSEC) 20 MG capsule Take 20 mg by mouth 2 (two) times daily.    05/12/2014 at Unknown time  . cephALEXin (KEFLEX) 250 MG capsule Take 1 capsule (250 mg total) by mouth 2 (two) times daily. (Patient not taking: Reported on 05/13/2014) 6 capsule 0   . glipiZIDE (GLUCOTROL) 2.5 mg TABS tablet Take 0.5 tablets (2.5 mg total) by mouth daily before breakfast. (Patient not taking: Reported on 05/13/2014) 30 tablet 0   . metroNIDAZOLE (FLAGYL) 500 MG tablet Take 500 mg by mouth 3 (three) times daily. For 14 days   05/09/2014  . nystatin (MYCOSTATIN) 100000 UNIT/ML  suspension Take 5 mLs by mouth 4 (four) times daily. For 5 days - swish and swallow   05/07/2014    Assessment: 80 yo man on depakote sprinkles 125 mg po bid at nursing home.  He is now NPO and MD requests IV depacon.  His valproic level was 23 mcg/mL.  Goal of Therapy:  Valproic acid level 50-100 mcg/mL  Plan:  Depacon 250 mg IV q12 hours. Will recheck level in ~ 4 days.  Thanks for allowing pharmacy to be a part of this patient's care.  Excell Seltzer, PharmD Clinical Pharmacist, [phone] 05/13/2014,5:19 PM

## 2014-05-13 NOTE — Consult Note (Signed)
Referring Physician: Darl Householder    Chief Complaint: possible stroke  HPI:                                                                                                                                         [name] is an 80 y.o. male who was recently at the hospital on 04/03/2014 and noted to have Multiple areas of restricted diffusion affecting the RIGHT anterior circulation consistent with acute infarction. Patient was discharged to a SNF.  Per family he was not moving his left arm and leg well secondary to infarct but could converse well and was moving his right side well.  HE was baseline yesterday but nursing staff at SNF noted he was non conversive today and would not follow commands. HE was noted to have new left eye deviation and head is held turned to the left. Currently he not following commands but responds to voice and painful stimuli.   Date last known well: Date: 05/12/2014 Time last known well: Unable to determine tPA Given: No: out of window   03-2014 [name] 04/02/2014 Multiple areas of restricted diffusion affecting the RIGHT anterior circulation consistent with acute infarction. Slight increased size of the LEFT frontotemporoparietal subdural hygroma, now 19 mm thick. Stable chronic mixed signal intensity RIGHT hemisphere extra-axial collection.   [name]  04/02/2014 Diminished caliber of the RIGHT ICA compared to the LEFT, progressive since 2013, relates to an apparent flow reducing 75-90% stenosis at the RICA origin.   Carotid duplex Right: 60-79% internal carotid artery stenosis. Left: 1-39% ICA stenosis. Bilateral: Vertebral artery flow is antegrade.   2D Echocardiogram EF normal with no source of embolus. Mild MR/  New right ICA infarct adjacent to old R MCA infarcts, thromboembolic secondary to R ICA occlusion. Not a surgical candidate due to baseline dementia    MRI RIGHT ICA infarcts. Old SDH stable  MRA RIGHT ICA 75-90% stenosis  at origin.   Carotid Doppler R 60-79% stenosis  2D Echo No source of embolus       Past Medical History  Diagnosis Date  . Coronary artery disease     post CABG in 1997 with abnormal nuclear stress test in 2007 which did demonstrate mild reversible ischemia and has been managed medically.   . Diabetes mellitus     type 2  . Hypertension   . Hyperlipidemia   . Hypercholesterolemia   . Atypical chest pain   . Hypernatremia   . History of hypoglycemia   . Syncope   . CVA (cerebral infarction)   . Allergy   . History of chicken pox   . Acute on chronic renal insufficiency   . CKD (chronic kidney disease)   . GERD (gastroesophageal reflux disease)   . H/O hiatal hernia     Past Surgical History  Procedure Laterality Date  . Coronary artery bypass  graft  1997    x3 -- patent internal mammary graft to saphenous vein grafts x3  . Tonsillectomy    . Cardiac catheterization  01/26/1999    patent internal mammary graft to saphenous vein grafts x3 -- singnificant three-vessel coroanry artery disease -- potential sites of ischemia involving the intermediate branch, circumflex branch and the distal LAD through diffuse disease, as well as steal type syndrome -- normal LV function -- W. Doristine Church., M.D.    . Esophagogastroduodenoscopy  07/22/2012    Procedure: ESOPHAGOGASTRODUODENOSCOPY (EGD);  Surgeon: Beryle Beams, MD;  Location: [facility];  Service: Endoscopy;  Laterality: N/A;  . Colonoscopy  07/22/2012    Procedure: COLONOSCOPY;  Surgeon: Beryle Beams, MD;  Location: [facility];  Service: Endoscopy;  Laterality: N/A;    Family History  Problem Relation Age of Onset  . Hypertension Other     Parent  . Diabetes Other     Parent  . Hyperlipidemia Other     Parent   Social History:  reports that he has never smoked. He has never used smokeless tobacco. He reports that he does not drink alcohol or use illicit drugs.  Allergies: No Known  Allergies  Medications:                                                                                                                           Current Facility-Administered Medications  Medication Dose Route Frequency Provider Last Rate Last Dose  . ceFAZolin (ANCEF) IVPB 1 g/50 mL premix  1 g Intravenous Once Wandra Arthurs, MD      . sodium chloride 0.9 % bolus 500 mL  500 mL Intravenous Once Wandra Arthurs, MD       Current Outpatient Prescriptions  Medication Sig Dispense Refill  . atorvastatin (LIPITOR) 20 MG tablet Take 1 tablet (20 mg total) by mouth daily at 6 PM. 30 tablet 0  . cephALEXin (KEFLEX) 250 MG capsule Take 1 capsule (250 mg total) by mouth 2 (two) times daily. 6 capsule 0  . clopidogrel (PLAVIX) 75 MG tablet Take 1 tablet (75 mg total) by mouth daily. 30 tablet 0  . ENSURE (ENSURE) Take 1 Can by mouth daily.    Marland Kitchen glipiZIDE (GLUCOTROL) 2.5 mg TABS tablet Take 0.5 tablets (2.5 mg total) by mouth daily before breakfast. 30 tablet 0  . isosorbide mononitrate (IMDUR) 30 MG 24 hr tablet Take 30 mg by mouth daily.    Marland Kitchen omeprazole (PRILOSEC) 20 MG capsule Take 20 mg by mouth daily.       ROS:  History obtained from unobtainable from patient due to mental status and lack of cooperation  General ROS: negative for - chills, fatigue, fever, night sweats, weight gain or weight loss Psychological ROS: negative for - behavioral disorder, hallucinations, memory difficulties, mood swings or suicidal ideation Ophthalmic ROS: negative for - blurry vision, double vision, eye pain or loss of vision ENT ROS: negative for - epistaxis, nasal discharge, oral lesions, sore throat, tinnitus or vertigo Allergy and Immunology ROS: negative for - hives or itchy/watery eyes Hematological and Lymphatic ROS: negative for - bleeding problems, bruising or swollen lymph  nodes Endocrine ROS: negative for - galactorrhea, hair pattern changes, polydipsia/polyuria or temperature intolerance Respiratory ROS: negative for - cough, hemoptysis, shortness of breath or wheezing Cardiovascular ROS: negative for - chest pain, dyspnea on exertion, edema or irregular heartbeat Gastrointestinal ROS: negative for - abdominal pain, diarrhea, hematemesis, nausea/vomiting or stool incontinence Genito-Urinary ROS: negative for - dysuria, hematuria, incontinence or urinary frequency/urgency Musculoskeletal ROS: negative for - joint swelling or muscular weakness Neurological ROS: as noted in HPI Dermatological ROS: negative for rash and skin lesion changes  Neurologic Examination:                                                                                                      Blood pressure 109/66, pulse 77, temperature 98.4 F (36.9 C), temperature source Rectal, resp. rate 18, height 0' (0 m), weight 0 kg (0 lb), SpO2 100 %.   General: NAD Mental Status: Alert, nonverbal. Follows no commands.    Cranial Nerves: II: Discs flat bilaterally; does not blink to confrontation on the right, blinks to confrontation on the left; pupils equal, round, reactive to light and accommodation III,IV, VI: ptosis not present, eyes deviated to the left, does not cross midline.  V,VII: left facial droop, winces to pain bilaterally VIII: hearing normal bilaterally IX,X: gag reflex present XI: bilateral shoulder shrug XII: midline tongue extension without atrophy or fasciculations  Motor: Moves right upper and lower extremity spontaneously.  No movement noted on left.  Increased tone noted on left.  External deviation of the LLE. Sensory: Responds bilaterally to noxious stimuli Deep Tendon Reflexes:  Right: Upper Extremity   Left: Upper extremity   biceps (C-5 to C-6) 2/4   biceps (C-5 to C-6) 3/4 tricep (C7) 2/4    triceps (C7) 3/4 Brachioradialis (C6) 2/4  Brachioradialis (C6)  3/4  Lower Extremity Lower Extremity  quadriceps (L-2 to L-4) 2/4   quadriceps (L-2 to L-4) 2/4 Achilles (S1) 1/4   Achilles (S1) 2/4  Plantars: Right: downgoing   Left: downgoing Cerebellar: Unable to obtain Gait: unable to obtain CV: pulses palpable throughout    Lab Results: Basic Metabolic Panel:  Recent Labs Lab 05/13/14 1120 05/13/14 1150  NA 148* 147  K 4.8 4.6  CL 112 115*  CO2 23  --   GLUCOSE 101* 103*  BUN 29* 32*  CREATININE 2.05* 2.20*  CALCIUM 9.3  --     Liver Function Tests:  Recent Labs Lab 05/13/14 1120  AST 18  ALT 10  ALKPHOS  68  BILITOT 0.3  PROT 7.2  ALBUMIN 3.0*   No results for input(s): LIPASE, AMYLASE in the last 168 hours. No results for input(s): AMMONIA in the last 168 hours.  CBC:  Recent Labs Lab 05/13/14 1120 05/13/14 1150  WBC 7.7  --   NEUTROABS 5.7  --   HGB 10.5* 12.2*  HCT 32.7* 36.0*  MCV 95.3  --   PLT 289  --     Cardiac Enzymes: No results for input(s): CKTOTAL, CKMB, CKMBINDEX, TROPONINI in the last 168 hours.  Lipid Panel: No results for input(s): CHOL, TRIG, HDL, CHOLHDL, VLDL, LDLCALC in the last 168 hours.  CBG: No results for input(s): GLUCAP in the last 168 hours.  Microbiology: Results for orders placed or performed during the hospital encounter of 04/01/14  Urine culture     Status: None   Collection Time: 04/02/14 12:54 AM  Result Value Ref Range Status   Specimen Description URINE, CLEAN CATCH  Final   Special Requests NONE  Final   Culture  Setup Time 04/02/2014 11:04  Final   Colony Count >=100,000 COLONIES/ML  Final   Culture CITROBACTER KOSERI  Final   Report Status 04/11/2014 FINAL  Final   Organism ID, Bacteria CITROBACTER KOSERI  Final      Susceptibility   Citrobacter koseri - MIC*    CEFAZOLIN <=4 SENSITIVE Sensitive     CEFTRIAXONE <=1 SENSITIVE Sensitive     CIPROFLOXACIN <=0.25 SENSITIVE Sensitive     GENTAMICIN <=1 SENSITIVE Sensitive     LEVOFLOXACIN <=0.12  SENSITIVE Sensitive     NITROFURANTOIN <=16 SENSITIVE Sensitive     TOBRAMYCIN <=1 SENSITIVE Sensitive     TRIMETH/SULFA <=20 SENSITIVE Sensitive     PIP/TAZO Value in next row Sensitive      <=4 SENSITIVEPerformed at [place]    Coagulation Studies:  Recent Labs  05/13/14 1120  LABPROT 14.6  INR 1.13    Imaging: Ct Head Wo Contrast  05/13/2014   CLINICAL DATA:  Stroke-like symptoms, altered mental status, fixed leftward gaze  EXAM: CT HEAD WITHOUT CONTRAST  TECHNIQUE: Contiguous axial images were obtained from the base of the skull through the vertex without intravenous contrast.  COMPARISON:  MRI brain dated 04/02/2014.  CT head dated 04/01/2012.  FINDINGS: Chronic left frontal subdural hygroma, measuring 17 mm in thickness, previously 19 mm. No evidence of parenchymal hemorrhage.  Mild mass effect with underlying sulcal effacement. 2-3 mm rightward midline shift.  Possible thin right frontal subdural hygroma without mass effect, chronic.  No mass lesion.  No CT evidence of acute infarction.  Extensive small vessel ischemic changes. Old left thalamic lacunar infarct. Old left occipital infarct.  Global cortical and central atrophy.  No ventriculomegaly.  The visualized paranasal sinuses are essentially clear. The mastoid air cells are unopacified.  No evidence of calvarial fracture.  IMPRESSION: No evidence of acute intracranial abnormality.  Chronic left frontal subdural hygroma. Underlying mass effect with 2-3 mm rightward midline shift.  Old left occipital infarct. Old left thalamic infarct. Extensive small vessel ischemic changes.   Electronically Signed   By: Julian Hy M.D.   On: 05/13/2014 11:47    Etta Quill PA-C [facility] [phone]  05/13/2014, 1:19 PM   Patient seen and examined.  Clinical course and management discussed.  Necessary edits performed.  I agree with the above.  Assessment and plan of care developed and  discussed below.     Assessment:  80 y.o. male recently admitted for stroke at the beginning of October and discharged on Plavix.  Returns today with new complaints of loss of speech and left eye deviation with [facility] on neurological examination.  Patient with recent stroke work up that included a carotid doppler showing a 1-39% stenosis on the left.  Right was 60-79% but intervention was decided against due to patient's low functional status.  Echocardiogram was unremarkable.  Patient was started on Plavix.    Stroke Risk Factors - diabetes mellitus, hyperlipidemia, hypertension and CAD   Recommendations: 1.  Would not repeat stroke work up at this time. 2.  If patient unable to pass swallow screen would start ASA 300mg  rectally per day.   3.  Once patient Dickson to take po or PEG in place would resume Plavix and start ASA 81mg  daily.   4.  PT/OT/Speech therapy   Alexis Goodell, MD [facility] [phone]  05/13/2014  3:16 PM

## 2014-05-13 NOTE — ED Notes (Signed)
Etta Quill, Neuro PA-C at bedside.

## 2014-05-13 NOTE — ED Notes (Signed)
Patient here by [facility] for stroke symptoms.   Patient last known normal last night.   Per EMS, patient is usually very talkative and moves around a lot.   Last seen normal when he went to sleep last night at 8pm.   This morning, patient non verbal, had trouble moving his L arm.   Patient cannot extend R arm.   Patient does follow commands for staff here, but would not follow for EMS.   Nursing home tried to feed patient this morning at their facility, patient still had food in mouth upon arrival.   Patient did spit a piece of food out of mouth for RN.

## 2014-05-13 NOTE — Progress Notes (Signed)
Received pt at 16:50pm via stretcher alert, nonverbal with no noted distress from ED. No signs or symptoms of pain. PERRLA. Unable to assess neuro at this time pt unable to follow commands.  Safety measures in place. HOB elevated. Call bell within reach. Report received from nurse, Maudie Mercury. Pt stable. Will continue to monitor.

## 2014-05-13 NOTE — ED Notes (Signed)
Please call daughter, Jaben Benegas with any concern regarding pt [phone]

## 2014-05-13 NOTE — ED Notes (Signed)
Darl Householder, MD aware of abnormal lab test results

## 2014-05-13 NOTE — ED Provider Notes (Signed)
CSN: [id number]     Arrival date & time 05/13/14  1052 History   First MD Initiated Contact with Patient 05/13/14 1053     Chief Complaint  Patient presents with  . Altered Mental Status     (Consider location/radiation/quality/duration/timing/severity/associated sxs/prior Treatment) The history is provided by the EMS personnel. The history is limited by the condition of the patient.  [name] is a 80 y.o. male hx of CAD s/p CABG, DM, HTN, recent CVA on plavix with L sided weakness here with aphasia. He resides at the nursing home and last normal was 8 PM last night. He was found by the nursing home staff this morning not talking. At baseline he is able to talk nonsensical words. Lasted a p.m. He went to bed and was not talking this morning. They also noted eye deviation to the left. No seizure activity noted.   Level  V caveat- dementia   Past Medical History  Diagnosis Date  . Coronary artery disease     post CABG in 1997 with abnormal nuclear stress test in 2007 which did demonstrate mild reversible ischemia and has been managed medically.   . Diabetes mellitus     type 2  . Hypertension   . Hyperlipidemia   . Hypercholesterolemia   . Atypical chest pain   . Hypernatremia   . History of hypoglycemia   . Syncope   . CVA (cerebral infarction)   . Allergy   . History of chicken pox   . Acute on chronic renal insufficiency   . CKD (chronic kidney disease)   . GERD (gastroesophageal reflux disease)   . H/O hiatal hernia    Past Surgical History  Procedure Laterality Date  . Coronary artery bypass graft  1997    x3 -- patent internal mammary graft to saphenous vein grafts x3  . Tonsillectomy    . Cardiac catheterization  01/26/1999    patent internal mammary graft to saphenous vein grafts x3 -- singnificant three-vessel coroanry artery disease -- potential sites of ischemia involving the intermediate branch, circumflex branch and the distal LAD through diffuse disease, as  well as steal type syndrome -- normal LV function -- W. Doristine Church., M.D.    . Esophagogastroduodenoscopy  07/22/2012    Procedure: ESOPHAGOGASTRODUODENOSCOPY (EGD);  Surgeon: Beryle Beams, MD;  Location: [facility];  Service: Endoscopy;  Laterality: N/A;  . Colonoscopy  07/22/2012    Procedure: COLONOSCOPY;  Surgeon: Beryle Beams, MD;  Location: [facility];  Service: Endoscopy;  Laterality: N/A;   Family History  Problem Relation Age of Onset  . Hypertension Other     Parent  . Diabetes Other     Parent  . Hyperlipidemia Other     Parent   History  Substance Use Topics  . Smoking status: Never Smoker   . Smokeless tobacco: Never Used  . Alcohol Use: No    Review of Systems  Unable to perform ROS: Dementia  Neurological: Positive for speech difficulty.      Allergies  Review of patient's allergies indicates no known allergies.  Home Medications   Prior to Admission medications   Medication Sig Start Date End Date Taking? Authorizing Provider  atorvastatin (LIPITOR) 20 MG tablet Take 1 tablet (20 mg total) by mouth daily at 6 PM. 04/05/14  Yes Belkys A Regalado, MD  clopidogrel (PLAVIX) 75 MG tablet Take 1 tablet (75 mg total) by mouth daily. Patient taking differently: Take 75 mg by mouth every evening.  04/05/14  Yes Belkys A Regalado, MD  divalproex (DEPAKOTE SPRINKLE) 125 MG capsule Take 125 mg by mouth 2 (two) times daily.   Yes Historical Provider, MD  ENSURE PLUS (ENSURE PLUS) LIQD Take 237 mLs by mouth daily.   Yes Historical Provider, MD  glipiZIDE (GLUCOTROL) 10 MG tablet Take 10 mg by mouth 2 (two) times daily before a meal.   Yes Historical Provider, MD  isosorbide mononitrate (IMDUR) 30 MG 24 hr tablet Take 30 mg by mouth every morning.  10/11/13  Yes Darlin Coco, MD  Melatonin 3 MG TABS Take 6 mg by mouth at bedtime.   Yes Historical Provider, MD  omeprazole (PRILOSEC) 20 MG capsule Take 20 mg by mouth 2 (two) times daily.    Yes Historical  Provider, MD  cephALEXin (KEFLEX) 250 MG capsule Take 1 capsule (250 mg total) by mouth 2 (two) times daily. Patient not taking: Reported on 05/13/2014 04/05/14   Belkys A Regalado, MD  glipiZIDE (GLUCOTROL) 2.5 mg TABS tablet Take 0.5 tablets (2.5 mg total) by mouth daily before breakfast. Patient not taking: Reported on 05/13/2014 04/05/14   Belkys A Regalado, MD  metroNIDAZOLE (FLAGYL) 500 MG tablet Take 500 mg by mouth 3 (three) times daily. For 14 days    Historical Provider, MD  nystatin (MYCOSTATIN) 100000 UNIT/ML suspension Take 5 mLs by mouth 4 (four) times daily. For 5 days - swish and swallow    Historical Provider, MD   BP 134/58 mmHg  Pulse 81  Temp(Src) 98.4 F (36.9 C) (Rectal)  Resp 11  Ht   Wt   SpO2 99% Physical Exam  Constitutional:  Chronically ill  HENT:  Head: Normocephalic.  Mouth/Throat: Oropharynx is clear and moist.  Eyes:  Eye deviation to the left. No obvious nystagmus. R sided neglect   Neck: Normal range of motion. Neck supple.  Cardiovascular: Normal rate, regular rhythm and normal heart sounds.   Pulmonary/Chest: Effort normal and breath sounds normal. No respiratory distress. He has no wheezes. He has no rales.  Abdominal: Soft. Bowel sounds are normal. He exhibits no distension and no mass. There is no tenderness. There is no rebound.  Musculoskeletal: Normal range of motion. He exhibits no edema.  Neurological:  Demented. Nonverbal. L side contracted. Moving R side. Not following commands.   Skin: Skin is warm and dry.  Psychiatric:  Unable   Nursing note and vitals reviewed.   ED Course  Procedures (including critical care time) Labs Review Labs Reviewed  CBC - Abnormal; Notable for the following:    RBC 3.43 (*)    Hemoglobin 10.5 (*)    HCT 32.7 (*)    All other components within normal limits  COMPREHENSIVE METABOLIC PANEL - Abnormal; Notable for the following:    Sodium 148 (*)    Glucose, Bld 101 (*)    BUN 29 (*)    Creatinine,  Ser 2.05 (*)    Albumin 3.0 (*)    GFR calc non Af Amer 28 (*)    GFR calc Af Amer 33 (*)    All other components within normal limits  URINALYSIS, ROUTINE W REFLEX MICROSCOPIC - Abnormal; Notable for the following:    APPearance HAZY (*)    Glucose, UA 100 (*)    Hgb urine dipstick TRACE (*)    Protein, ur 30 (*)    Leukocytes, UA SMALL (*)    All other components within normal limits  URINE MICROSCOPIC-ADD ON - Abnormal; Notable for the following:  Squamous Epithelial / LPF FEW (*)    Bacteria, UA MANY (*)    All other components within normal limits  I-STAT CHEM 8, ED - Abnormal; Notable for the following:    Chloride 115 (*)    BUN 32 (*)    Creatinine, Ser 2.20 (*)    Glucose, Bld 103 (*)    Hemoglobin 12.2 (*)    HCT 36.0 (*)    All other components within normal limits  ETHANOL  PROTIME-INR  APTT  DIFFERENTIAL  URINE RAPID DRUG SCREEN (HOSP PERFORMED)  VALPROIC ACID LEVEL  I-STAT TROPOININ, ED  I-STAT TROPOININ, ED    Imaging Review Ct Head Wo Contrast  05/13/2014   CLINICAL DATA:  Stroke-like symptoms, altered mental status, fixed leftward gaze  EXAM: CT HEAD WITHOUT CONTRAST  TECHNIQUE: Contiguous axial images were obtained from the base of the skull through the vertex without intravenous contrast.  COMPARISON:  MRI brain dated 04/02/2014.  CT head dated 04/01/2012.  FINDINGS: Chronic left frontal subdural hygroma, measuring 17 mm in thickness, previously 19 mm. No evidence of parenchymal hemorrhage.  Mild mass effect with underlying sulcal effacement. 2-3 mm rightward midline shift.  Possible thin right frontal subdural hygroma without mass effect, chronic.  No mass lesion.  No CT evidence of acute infarction.  Extensive small vessel ischemic changes. Old left thalamic lacunar infarct. Old left occipital infarct.  Global cortical and central atrophy.  No ventriculomegaly.  The visualized paranasal sinuses are essentially clear. The mastoid air cells are unopacified.   No evidence of calvarial fracture.  IMPRESSION: No evidence of acute intracranial abnormality.  Chronic left frontal subdural hygroma. Underlying mass effect with 2-3 mm rightward midline shift.  Old left occipital infarct. Old left thalamic infarct. Extensive small vessel ischemic changes.   Electronically Signed   By: Julian Hy M.D.   On: 05/13/2014 11:47     EKG Interpretation   Date/Time:  Friday May 13 2014 11:59:17 EST Ventricular Rate:  75 PR Interval:    QRS Duration: 81 QT Interval:  396 QTC Calculation: 442 R Axis:   5 Text Interpretation:  Atrial fibrillation RSR' in V1 or V2, probably  normal variant Minimal ST depression Artifact in lead(s) I II III aVR aVL  aVF V1 V2 V3 V4 V5 V6 poor baseline.  No significant change since last  tracing Confirmed by Alyssa Mancera  MD, July Nickson ([id number]) on 05/13/2014 12:08:41 PM      MDM   Final diagnoses:  None    [name] is a 80 y.o. male here with aphasia. Likely another stroke. Outside of window for TPA and patient on plavix already. Will get CT head, stroke workup.   2:56 PM CT head unchanged. Na 148. UA + UTI with cath urine. Given ceftriaxone. Dr. Doy Mince saw patient. She feels that its likely progression of the stroke. He will likely need swallow study and may need peg. Recommend aspirin in addition to plavix. Will admit.   Wandra Arthurs, MD 05/13/14 [phone]

## 2014-05-13 NOTE — ED Notes (Signed)
Called daughter, Jeani Hawking to inform her of pt's room assignment.

## 2014-05-13 NOTE — H&P (Signed)
Patient Demographics  [name], is a 80 y.o. male  MRN: [id number]   DOB - [birth year]  Admit Date - 05/13/2014  Outpatient Primary MD for the patient is Webb Silversmith, NP   With History of -  Past Medical History  Diagnosis Date  . Coronary artery disease     post CABG in 1997 with abnormal nuclear stress test in 2007 which did demonstrate mild reversible ischemia and has been managed medically.   . Diabetes mellitus     type 2  . Hypertension   . Hyperlipidemia   . Hypercholesterolemia   . Atypical chest pain   . Hypernatremia   . History of hypoglycemia   . Syncope   . CVA (cerebral infarction)   . Allergy   . History of chicken pox   . Acute on chronic renal insufficiency   . CKD (chronic kidney disease)   . GERD (gastroesophageal reflux disease)   . H/O hiatal hernia       Past Surgical History  Procedure Laterality Date  . Coronary artery bypass graft  1997    x3 -- patent internal mammary graft to saphenous vein grafts x3  . Tonsillectomy    . Cardiac catheterization  01/26/1999    patent internal mammary graft to saphenous vein grafts x3 -- singnificant three-vessel coroanry artery disease -- potential sites of ischemia involving the intermediate branch, circumflex branch and the distal LAD through diffuse disease, as well as steal type syndrome -- normal LV function -- W. Doristine Church., M.D.    . Esophagogastroduodenoscopy  07/22/2012    Procedure: ESOPHAGOGASTRODUODENOSCOPY (EGD);  Surgeon: Beryle Beams, MD;  Location: [facility];  Service: Endoscopy;  Laterality: N/A;  . Colonoscopy  07/22/2012    Procedure: COLONOSCOPY;  Surgeon: Beryle Beams, MD;  Location: [facility];  Service: Endoscopy;  Laterality: N/A;    in for   Chief Complaint  Patient presents with  . Altered  Mental Status     HPI  [name]  is a 80 y.o. male, with history of old subdural hematoma, recent admission and discharge last month for right anterior circulation CVA, type 2 diabetes mellitus, seizures, CAD, right-sided carotid stenosis with 60-79% stenosis found last admission medical treatment per plan last admission, hypertension, dyslipidemia, UTIs who lives in a nursing home has been brought in with chief complaints of aphasia in being less than usually responsive, he does have chronic left-sided weakness but was conversing, exact time of onset unknown, brought to the ER where CT scan of the head was nonacute, he was seen by neurology who thought he probably had progression of his CVA versus change in mental status due to UTI. Aspirin rectal suppository was ordered by neurology and we were requested to admit, he recently had full stroke workup and at this time neurology has recommended not to repeat. If he passes a swallow screen neurology recommends aspirin and Plavix combination.    Review  of Systems    Patient is aphasic and unable to provide review of systems.   Social History History  Substance Use Topics  . Smoking status: Never Smoker   . Smokeless tobacco: Never Used  . Alcohol Use: No      Family History Family History  Problem Relation Age of Onset  . Hypertension Other     Parent  . Diabetes Other     Parent  . Hyperlipidemia Other     Parent      Prior to Admission medications   Medication Sig Start Date End Date Taking? Authorizing Provider  atorvastatin (LIPITOR) 20 MG tablet Take 1 tablet (20 mg total) by mouth daily at 6 PM. 04/05/14  Yes Belkys A Regalado, MD  clopidogrel (PLAVIX) 75 MG tablet Take 1 tablet (75 mg total) by mouth daily. Patient taking differently: Take 75 mg by mouth every evening.  04/05/14  Yes Belkys A Regalado, MD  divalproex (DEPAKOTE SPRINKLE) 125 MG capsule Take 125 mg by mouth 2 (two) times daily.   Yes Historical Provider, MD    ENSURE PLUS (ENSURE PLUS) LIQD Take 237 mLs by mouth daily.   Yes Historical Provider, MD  glipiZIDE (GLUCOTROL) 10 MG tablet Take 10 mg by mouth 2 (two) times daily before a meal.   Yes Historical Provider, MD  isosorbide mononitrate (IMDUR) 30 MG 24 hr tablet Take 30 mg by mouth every morning.  10/11/13  Yes Darlin Coco, MD  Melatonin 3 MG TABS Take 6 mg by mouth at bedtime.   Yes Historical Provider, MD  omeprazole (PRILOSEC) 20 MG capsule Take 20 mg by mouth 2 (two) times daily.    Yes Historical Provider, MD  cephALEXin (KEFLEX) 250 MG capsule Take 1 capsule (250 mg total) by mouth 2 (two) times daily. Patient not taking: Reported on 05/13/2014 04/05/14   Belkys A Regalado, MD  glipiZIDE (GLUCOTROL) 2.5 mg TABS tablet Take 0.5 tablets (2.5 mg total) by mouth daily before breakfast. Patient not taking: Reported on 05/13/2014 04/05/14   Belkys A Regalado, MD  metroNIDAZOLE (FLAGYL) 500 MG tablet Take 500 mg by mouth 3 (three) times daily. For 14 days    Historical Provider, MD  nystatin (MYCOSTATIN) 100000 UNIT/ML suspension Take 5 mLs by mouth 4 (four) times daily. For 5 days - swish and swallow    Historical Provider, MD    No Known Allergies  Physical Exam  Vitals  Blood pressure 153/77, pulse 85, temperature 98.4 F (36.9 C), temperature source Rectal, resp. rate 22, height 0' (0 m), weight 0 kg (0 lb), SpO2 100 %.   1. General elderly African-American male lying in bed in NAD,     2. Normal affect and insight, Not Suicidal or Homicidal, Awake but not alert cannot follow commands,   3. Does not move left-sided extremity is even to pain, Plantars down going.  4. Ears and Eyes appear Normal, Conjunctivae clear, PERRLA. Moist Oral Mucosa.  5. Supple Neck, No JVD, No cervical lymphadenopathy appriciated, No Carotid Bruits.  6. Symmetrical Chest wall movement, Good air movement bilaterally, CTAB.  7. RRR, No Gallops, Rubs or Murmurs, No Parasternal Heave.  8. Positive Bowel  Sounds, Abdomen Soft, No tenderness, No organomegaly appriciated,No rebound -guarding or rigidity.  9.  No Cyanosis, Normal Skin Turgor, No Skin Rash or Bruise.  10. Good muscle tone,  joints appear normal , no effusions, Normal ROM.  11. No Palpable Lymph Nodes in Neck or Axillae     Data Review  CBC  Recent Labs Lab 05/13/14 1120 05/13/14 1150  WBC 7.7  --   HGB 10.5* 12.2*  HCT 32.7* 36.0*  PLT 289  --   MCV 95.3  --   MCH 30.6  --   MCHC 32.1  --   RDW 14.3  --   LYMPHSABS 1.6  --   MONOABS 0.3  --   EOSABS 0.1  --   BASOSABS 0.0  --    ------------------------------------------------------------------------------------------------------------------  Chemistries   Recent Labs Lab 05/13/14 1120 05/13/14 1150  NA 148* 147  K 4.8 4.6  CL 112 115*  CO2 23  --   GLUCOSE 101* 103*  BUN 29* 32*  CREATININE 2.05* 2.20*  CALCIUM 9.3  --   AST 18  --   ALT 10  --   ALKPHOS 68  --   BILITOT 0.3  --    ------------------------------------------------------------------------------------------------------------------ CrCl cannot be calculated (Unknown ideal weight.). ------------------------------------------------------------------------------------------------------------------ No results for input(s): TSH, T4TOTAL, T3FREE, THYROIDAB in the last 72 hours.  Invalid input(s): FREET3   Coagulation profile  Recent Labs Lab 05/13/14 1120  INR 1.13   ------------------------------------------------------------------------------------------------------------------- No results for input(s): DDIMER in the last 72 hours. -------------------------------------------------------------------------------------------------------------------  Cardiac Enzymes No results for input(s): CKMB, TROPONINI, MYOGLOBIN in the last 168 hours.  Invalid input(s):  CK ------------------------------------------------------------------------------------------------------------------ Invalid input(s): POCBNP   ---------------------------------------------------------------------------------------------------------------  Urinalysis    Component Value Date/Time   COLORURINE YELLOW 05/13/2014 1207   APPEARANCEUR HAZY* 05/13/2014 1207   LABSPEC 1.014 05/13/2014 1207   PHURINE 5.5 05/13/2014 1207   GLUCOSEU 100* 05/13/2014 1207   HGBUR TRACE* 05/13/2014 [place] 05/13/2014 1207   BILIRUBINUR neg 06/08/2012 1545   KETONESUR NEGATIVE 05/13/2014 1207   PROTEINUR 30* 05/13/2014 1207   PROTEINUR 100 06/08/2012 1545   UROBILINOGEN 0.2 05/13/2014 1207   UROBILINOGEN 0.2 06/08/2012 1545   NITRITE NEGATIVE 05/13/2014 1207   NITRITE neg 06/08/2012 1545   LEUKOCYTESUR SMALL* 05/13/2014 1207    ----------------------------------------------------------------------------------------------------------------  Imaging results:   Ct Head Wo Contrast  05/13/2014   CLINICAL DATA:  Stroke-like symptoms, altered mental status, fixed leftward gaze  EXAM: CT HEAD WITHOUT CONTRAST  TECHNIQUE: Contiguous axial images were obtained from the base of the skull through the vertex without intravenous contrast.  COMPARISON:  MRI brain dated 04/02/2014.  CT head dated 04/01/2012.  FINDINGS: Chronic left frontal subdural hygroma, measuring 17 mm in thickness, previously 19 mm. No evidence of parenchymal hemorrhage.  Mild mass effect with underlying sulcal effacement. 2-3 mm rightward midline shift.  Possible thin right frontal subdural hygroma without mass effect, chronic.  No mass lesion.  No CT evidence of acute infarction.  Extensive small vessel ischemic changes. Old left thalamic lacunar infarct. Old left occipital infarct.  Global cortical and central atrophy.  No ventriculomegaly.  The visualized paranasal sinuses are essentially clear. The mastoid air  cells are unopacified.  No evidence of calvarial fracture.  IMPRESSION: No evidence of acute intracranial abnormality.  Chronic left frontal subdural hygroma. Underlying mass effect with 2-3 mm rightward midline shift.  Old left occipital infarct. Old left thalamic infarct. Extensive small vessel ischemic changes.   Electronically Signed   By: Julian Hy M.D.   On: 05/13/2014 11:47    My personal review of EKG: Rhythm ? Afib, Rate    no Acute ST changes   Recent workup   [name] 04/02/2014 Multiple areas of restricted diffusion affecting the RIGHT anterior circulation consistent with acute infarction. Slight  increased size of the LEFT frontotemporoparietal subdural hygroma, now 19 mm thick. Stable chronic mixed signal intensity RIGHT hemisphere extra-axial collection.   [name]  04/02/2014 Diminished caliber of the RIGHT ICA compared to the LEFT, progressive since 2013, relates to an apparent flow reducing 75-90% stenosis at the RICA origin.   Carotid duplex Right: 60-79% internal carotid artery stenosis. Left: 1-39% ICA stenosis. Bilateral: Vertebral artery flow is antegrade.   2D Echocardiogram EF normal with no source of embolus. Mild MR.    Assessment & Plan   1. Aphasia with left-sided weakness chronic but some worsening possibly acute. History of subdural bleed and recent right anterior circulation ischemic stroke 1 month ago. Likely extension of old stroke versus unmasking of symptoms due to UTI. Seen by neurology, currently has failed swallow study so aspirin suppository, PT OT speech eval, recent extensive to workup therefore not to be repeated per neurology. If pass follow screen place on aspirin and Plavix. Neuro to follow. Will be admitted to telemetry.    2.DM type II. Currently nothing by mouth, A1c, every 4hr  ISS.    3. Dyslipidemia. Currently nothing by mouth once taking orally resume statin.    4.UTI. IV Rocephin. Follow  cultures.    5. GERD. IV PPI for now.    6. Right carotid 60-79% stenosis found last stroke workup a few months ago. At that time it was decided that due to his poor functional status medical treatment only with antiplatelet medications and statin. Continues to have poor function status. Continue medical treatment as appropriate.    7. ? A. Fib on EKG. Poor baseline. Was regular on monitor, will repeat EKG and monitor on telemetry. Work ended for anticoagulation due to history of subdural bleed and extremely poor functional status.    8.  CKD 4-5 . Baseline creatinine around 2.2. At baseline. Gently hydrate and monitor. Currently nothing by mouth.    9. CAD - EKG nonacute, supportive care.    10. Poor functional status and DO NOT RESUSCITATE status. Agree. Supportive care only. PEG tube might not improve his quality of life and may subject him to aspiration in the future. If does not improve we might consider palliative care     DVT Prophylaxis Heparin    AM Labs Ordered, also please review Full Orders  Family Communication: Admission, patients condition and plan of care including tests being ordered have been discussed with the patient who indicates understanding and agree with the plan and Code Status.  Code Status DNR  Likely DC to SNF  Condition GUARDED     Time spent in minutes :35    SINGH,PRASHANT K M.D on 05/13/2014 at 3:44 PM  Between 7am to 7pm - Pager - [phone]  After 7pm go to [email or web address] - password [email or web address]  And look for the night coverage person covering me after hours  [facility] Office  [phone]

## 2014-05-13 NOTE — ED Notes (Signed)
CBG 87. 

## 2014-05-14 ENCOUNTER — Inpatient Hospital Stay (HOSPITAL_COMMUNITY): Payer: Medicare PPO

## 2014-05-14 DIAGNOSIS — I10 Essential (primary) hypertension: Secondary | ICD-10-CM

## 2014-05-14 DIAGNOSIS — I63412 Cerebral infarction due to embolism of left middle cerebral artery: Secondary | ICD-10-CM

## 2014-05-14 LAB — CBC
HCT: 34.3 % — ABNORMAL LOW (ref 39.0–52.0)
HEMOGLOBIN: 11.1 g/dL — AB (ref 13.0–17.0)
MCH: 31.7 pg (ref 26.0–34.0)
MCHC: 32.4 g/dL (ref 30.0–36.0)
MCV: 98 fL (ref 78.0–100.0)
Platelets: 274 10*3/uL (ref 150–400)
RBC: 3.5 MIL/uL — AB (ref 4.22–5.81)
RDW: 14.5 % (ref 11.5–15.5)
WBC: 7.8 10*3/uL (ref 4.0–10.5)

## 2014-05-14 LAB — GLUCOSE, CAPILLARY
GLUCOSE-CAPILLARY: 171 mg/dL — AB (ref 70–99)
GLUCOSE-CAPILLARY: 60 mg/dL — AB (ref 70–99)
GLUCOSE-CAPILLARY: 94 mg/dL (ref 70–99)
GLUCOSE-CAPILLARY: 99 mg/dL (ref 70–99)
Glucose-Capillary: 131 mg/dL — ABNORMAL HIGH (ref 70–99)
Glucose-Capillary: 202 mg/dL — ABNORMAL HIGH (ref 70–99)
Glucose-Capillary: 85 mg/dL (ref 70–99)
Glucose-Capillary: 90 mg/dL (ref 70–99)

## 2014-05-14 LAB — HEMOGLOBIN A1C
Hgb A1c MFr Bld: 5.4 % (ref <5.7)
Mean Plasma Glucose: 108 mg/dL (ref <117)

## 2014-05-14 LAB — BASIC METABOLIC PANEL
ANION GAP: 17 — AB (ref 5–15)
BUN: 30 mg/dL — ABNORMAL HIGH (ref 6–23)
CHLORIDE: 113 meq/L — AB (ref 96–112)
CO2: 19 mEq/L (ref 19–32)
Calcium: 9 mg/dL (ref 8.4–10.5)
Creatinine, Ser: 1.9 mg/dL — ABNORMAL HIGH (ref 0.50–1.35)
GFR calc Af Amer: 36 mL/min — ABNORMAL LOW (ref >90)
GFR, EST NON AFRICAN AMERICAN: 31 mL/min — AB (ref >90)
Glucose, Bld: 122 mg/dL — ABNORMAL HIGH (ref 70–99)
POTASSIUM: 4.6 meq/L (ref 3.7–5.3)
SODIUM: 149 meq/L — AB (ref 137–147)

## 2014-05-14 LAB — LIPID PANEL
CHOLESTEROL: 152 mg/dL (ref 0–200)
HDL: 32 mg/dL — ABNORMAL LOW (ref >39)
LDL CALC: 99 mg/dL (ref 0–99)
Total CHOL/HDL Ratio: 4.8 RATIO
Triglycerides: 107 mg/dL (ref <150)
VLDL: 21 mg/dL (ref 0–40)

## 2014-05-14 MED ORDER — VALPROATE SODIUM 500 MG/5ML IV SOLN
125.0000 mg | Freq: Two times a day (BID) | INTRAVENOUS | Status: DC
  Administered 2014-05-14 – 2014-05-17 (×6): 125 mg via INTRAVENOUS
  Filled 2014-05-14 (×6): qty 1.25

## 2014-05-14 MED ORDER — SODIUM CHLORIDE 0.45 % IV SOLN
INTRAVENOUS | Status: DC
  Administered 2014-05-14 – 2014-05-15 (×2): via INTRAVENOUS

## 2014-05-14 MED ORDER — INFLUENZA VAC SPLIT QUAD 0.5 ML IM SUSY
0.5000 mL | PREFILLED_SYRINGE | INTRAMUSCULAR | Status: AC
  Administered 2014-05-15: 0.5 mL via INTRAMUSCULAR
  Filled 2014-05-14: qty 0.5

## 2014-05-14 MED ORDER — DEXTROSE 50 % IV SOLN
INTRAVENOUS | Status: AC
  Filled 2014-05-14: qty 50

## 2014-05-14 MED ORDER — DEXTROSE 50 % IV SOLN
25.0000 mL | Freq: Once | INTRAVENOUS | Status: AC | PRN
  Administered 2014-05-14: 25 mL via INTRAVENOUS

## 2014-05-14 NOTE — Progress Notes (Signed)
OT Cancellation Note  Patient Details Name: Lisandro Meggett MRN: [id number] DOB: [birth year]   Cancelled Treatment:    Reason Eval/Treat Not Completed: Patient at procedure or test/ unavailable. Pt off floor for MRI. OT will follow up with pt as available to complete evaluation.   [place]illa Herb M   Cyndie Chime, OTR/L Occupational Therapist [phone] (pager)  05/14/2014, 3:16 PM

## 2014-05-14 NOTE — Evaluation (Signed)
Clinical/Bedside Swallow Evaluation Patient Details  Name: [name] MRN: [id number] Date of Birth: 04-02-1928  Today's Date: 05/14/2014 Time: 0130-0155 SLP Time Calculation (min) (ACUTE ONLY): 25 min  Past Medical History:  Past Medical History  Diagnosis Date  . Coronary artery disease     post CABG in 1997 with abnormal nuclear stress test in 2007 which did demonstrate mild reversible ischemia and has been managed medically.   . Diabetes mellitus     type 2  . Hypertension   . Hyperlipidemia   . Hypercholesterolemia   . Atypical chest pain   . Hypernatremia   . History of hypoglycemia   . Syncope   . CVA (cerebral infarction)   . Allergy   . History of chicken pox   . Acute on chronic renal insufficiency   . CKD (chronic kidney disease)   . GERD (gastroesophageal reflux disease)   . H/O hiatal hernia    Past Surgical History:  Past Surgical History  Procedure Laterality Date  . Coronary artery bypass graft  1997    x3 -- patent internal mammary graft to saphenous vein grafts x3  . Tonsillectomy    . Cardiac catheterization  01/26/1999    patent internal mammary graft to saphenous vein grafts x3 -- singnificant three-vessel coroanry artery disease -- potential sites of ischemia involving the intermediate branch, circumflex branch and the distal LAD through diffuse disease, as well as steal type syndrome -- normal LV function -- W. Doristine Church., M.D.    . Esophagogastroduodenoscopy  07/22/2012    Procedure: ESOPHAGOGASTRODUODENOSCOPY (EGD);  Surgeon: Beryle Beams, MD;  Location: [facility];  Service: Endoscopy;  Laterality: N/A;  . Colonoscopy  07/22/2012    Procedure: COLONOSCOPY;  Surgeon: Beryle Beams, MD;  Location: [facility];  Service: Endoscopy;  Laterality: N/A;   HPI:  [name]  is a 80 y.o. male, with history of old subdural hematoma, recent admission and discharge last month for right anterior circulation CVA, type 2 diabetes mellitus, seizures,  CAD, right-sided carotid stenosis with 60-79% stenosis found last admission medical treatment per plan last admission, hypertension, dyslipidemia, UTIs who lives in a nursing home has been brought in with chief complaints of aphasia in being less than usually responsive, he does have chronic left-sided weakness but was conversing, exact time of onset unknown, brought to the ER where CT scan of the head was nonacute, he was seen by neurology who thought he probably had progression of his CVA versus change in mental status due to UTI. Pt was NPO prior to BSE/SLE and not vocalizing or following commands.    Assessment / Plan / Recommendation Clinical Impression   Pt with no s/s of aspiration with puree or thin consistency via small sips, but pt did exhibit oral holding and decreased anterior/posterior movement with puree and suspected swallow delay with both consistencies as well; pt was eager to consume any intake orally via opening mouth when spoon presented, although he was not able to follow commands to complete OME and this was limited overall; pt only attended on his left side during BSE and did not vocalize throughout session, which impacted vocal quality assessment during BSE.  He exhibited prolonged mastication and oral holding >1 minute with cracker consistency (less than 1/4 amount) and this was eventually removed from oral cavity.  Conservative Dyphagia 1 (puree) diet recommended with thin liquids via small sips or tsp only.  ST will f/u for diet tolerance and SLE.    Aspiration  Risk  Mild    Diet Recommendation Dysphagia 1 (Puree);Thin liquid (no straws, small sips or via tsp)   Liquid Administration via: Spoon;Cup;No straw (small sips only) Medication Administration: Crushed with puree Supervision: Full supervision/cueing for compensatory strategies Compensations: Slow rate;Small sips/bites;Check for pocketing Postural Changes and/or Swallow Maneuvers: Seated upright 90 degrees;Upright  30-60 min after meal    Other  Recommendations Oral Care Recommendations: Oral care BID   Follow Up Recommendations  Skilled Nursing facility    Frequency and Duration min 2x/week  2 weeks   Pertinent Vitals/Pain WDL    SLP Swallow Goals  See POC   Swallow Study Prior Functional Status  Type of Home: House Available Help at Discharge: Family;Available 24 hours/day    General Date of Onset: 05/13/14 HPI: [name]  is a 80 y.o. male, with history of old subdural hematoma, recent admission and discharge last month for right anterior circulation CVA, type 2 diabetes mellitus, seizures, CAD, right-sided carotid stenosis with 60-79% stenosis found last admission medical treatment per plan last admission, hypertension, dyslipidemia, UTIs who lives in a nursing home has been brought in with chief complaints of aphasia in being less than usually responsive, he does have chronic left-sided weakness but was conversing, exact time of onset unknown, brought to the ER where CT scan of the head was nonacute, he was seen by neurology who thought he probably had progression of his CVA versus change in mental status due to UTI. Pt was NPO prior to BSE/SLE and not vocalizing or following commands.  Type of Study: Bedside swallow evaluation Diet Prior to this Study: NPO Temperature Spikes Noted: No Respiratory Status: Room air Behavior/Cognition: Alert;Confused;Distractible;Requires cueing;Doesn't follow directions;Decreased sustained attention Oral Cavity - Dentition: Dentures, top;Missing dentition Self-Feeding Abilities: Needs assist Patient Positioning: Upright in bed Baseline Vocal Quality: Other (comment) (unable to assess d/t limited vocalizations) Volitional Cough: Cognitively unable to elicit Volitional Swallow: Unable to elicit    Oral/Motor/Sensory Function Overall Oral Motor/Sensory Function: Impaired at baseline Labial ROM: Other (Comment) (unable to assess) Labial Symmetry: Other  (Comment) (unable to assess)   Ice Chips Ice chips: Impaired Presentation: Spoon Oral Phase Functional Implications: Prolonged oral transit Pharyngeal Phase Impairments: Suspected delayed Swallow   Thin Liquid Thin Liquid: Impaired Presentation: Cup;Spoon Pharyngeal  Phase Impairments: Suspected delayed Swallow    Nectar Thick Nectar Thick Liquid: Not tested   Honey Thick Honey Thick Liquid: Not tested   Puree Puree: Impaired Presentation: Spoon Oral Phase Functional Implications: Prolonged oral transit Pharyngeal Phase Impairments: Suspected delayed Swallow   Solid       Solid: Impaired Presentation: Spoon Oral Phase Impairments: Impaired anterior to posterior transit Oral Phase Functional Implications: Oral holding Pharyngeal Phase Impairments: Suspected delayed Swallow;Other (comments) (did not propel bolus back; bolus removed)       ADAMS,PAT, M.S., CCC-SLP 05/14/2014,2:49 PM

## 2014-05-14 NOTE — Progress Notes (Addendum)
This RN notified that patient blood sugar was 60. Hypoglycemic protocol initiated and 25 mL if 50% dextrose given through IV.  Will monitor patient and recheck blood sugar in 15 minutes. Burnell Blanks, RN  Blood sugar 131 after recheck. Will continue to monitor patient closely.

## 2014-05-14 NOTE — Plan of Care (Signed)
Problem: Acute Treatment Outcomes Goal: 02 Sats > 94% Outcome: Completed/Met Date Met:  05/14/14 Goal: tPA Patient w/o S&S of bleeding Outcome: Not Applicable Date Met:  28/67/51  Problem: Progression Outcomes Goal: If vent dependent, tolerates weaning Outcome: Not Applicable Date Met:  98/24/29

## 2014-05-14 NOTE — Progress Notes (Signed)
[facility] PROGRESS NOTE  [name] NLG:[id number] DOB: 11-19-1927 DOA: 05/13/2014 PCP: Webb Silversmith, NP  Assessment/Plan: 1. Aphasia with left-sided weakness chronic but some worsening possibly acute. History of subdural bleed and recent right anterior circulation ischemic stroke 1 month ago, was placed on Plavix then. -MRI ordered per Dr.Xu -currently on ASA 300mg  PR pending swallow eval -would consider conservative mgt only given overall poor health -ECHO 10/15  with normal EF and wall motion, Carotid duplex: with R 40-69% stenosis -PT/Ot/ST evals pending  2. DM type II. Currently NPO, FU HbA1c -SSI, stop glipizide  3. Dyslipidemia.  -Resume statin pending swallow eval  4.UTI. IV Rocephin. Follow cultures.  5. CKD 4: -creatinine better than recent baseline -change IVf to 1/2 NS due to hypernatremia  6. Right carotid 60-79% stenosis found last stroke workup a few months ago. At that time it was decided that due to his poor functional status medical treatment only with antiplatelet medications and statin.   7. P A Fib on EKG.  -but very poor baseline, repeat EKG. Would be a poor candidate for anticoagulation due to history of subdural bleed and extremely poor functional status, will defer to Neuro depending on MRI findings  8. CAD - stable  9. Dementia -on depakote sprinkles at SNF, now IV  Poor functional status and DO NOT RESUSCITATE status. Agree. Supportive care only. PEG tube might not improve his quality of life and may subject him to aspiration in the future. If does not improve we will consider palliative care  DVT Prophylaxis Heparin   Condition GUARDED   Code Status: DNR Family Communication: none at bedside Disposition Plan: SNF when stable  Consultants:  Neuro    HPI/Subjective: No complaints, just back from MRI  Objective: Filed Vitals:   05/14/14 0623  BP: 151/79  Pulse: 74  Temp: 98.2 F (36.8 C)  Resp:     Intake/Output  Summary (Last 24 hours) at 05/14/14 0855 Last data filed at 05/14/14 0801  Gross per 24 hour  Intake      0 ml  Output    400 ml  Net   -400 ml   Filed Weights   05/14/14 0500  Weight: 63.73 kg (140 lb 8 oz)    Exam:   General:  Elderly male, Alert, smiling, does not speak to me  Cardiovascular: S1S2/Irregular rate and rhythm  Respiratory: CTAB  Abdomen: soft, Nt, BS present  Musculoskeletal: no edema c/c  Neuro:   Does not move Left side, moves R side spontaneously, will not follow commands to assess strength  Data Reviewed: Basic Metabolic Panel:  Recent Labs Lab 05/13/14 1120 05/13/14 1150 05/14/14 0707  NA 148* 147 149*  K 4.8 4.6 4.6  CL 112 115* 113*  CO2 23  --  19  GLUCOSE 101* 103* 122*  BUN 29* 32* 30*  CREATININE 2.05* 2.20* 1.90*  CALCIUM 9.3  --  9.0   Liver Function Tests:  Recent Labs Lab 05/13/14 1120  AST 18  ALT 10  ALKPHOS 68  BILITOT 0.3  PROT 7.2  ALBUMIN 3.0*   No results for input(s): LIPASE, AMYLASE in the last 168 hours. No results for input(s): AMMONIA in the last 168 hours. CBC:  Recent Labs Lab 05/13/14 1120 05/13/14 1150 05/14/14 0707  WBC 7.7  --  7.8  NEUTROABS 5.7  --   --   HGB 10.5* 12.2* 11.1*  HCT 32.7* 36.0* 34.3*  MCV 95.3  --  98.0  PLT 289  --  274   Cardiac Enzymes: No results for input(s): CKTOTAL, CKMB, CKMBINDEX, TROPONINI in the last 168 hours. BNP (last 3 results) No results for input(s): PROBNP in the last 8760 hours. CBG:  Recent Labs Lab 05/13/14 2059 05/14/14 0019 05/14/14 0048 05/14/14 0429 05/14/14 0757  GLUCAP 99 60* 131* 85 90    No results found for this or any previous visit (from the past 240 hour(s)).   Studies: Ct Head Wo Contrast  05/13/2014   CLINICAL DATA:  Stroke-like symptoms, altered mental status, fixed leftward gaze  EXAM: CT HEAD WITHOUT CONTRAST  TECHNIQUE: Contiguous axial images were obtained from the base of the skull through the vertex without  intravenous contrast.  COMPARISON:  MRI brain dated 04/02/2014.  CT head dated 04/01/2012.  FINDINGS: Chronic left frontal subdural hygroma, measuring 17 mm in thickness, previously 19 mm. No evidence of parenchymal hemorrhage.  Mild mass effect with underlying sulcal effacement. 2-3 mm rightward midline shift.  Possible thin right frontal subdural hygroma without mass effect, chronic.  No mass lesion.  No CT evidence of acute infarction.  Extensive small vessel ischemic changes. Old left thalamic lacunar infarct. Old left occipital infarct.  Global cortical and central atrophy.  No ventriculomegaly.  The visualized paranasal sinuses are essentially clear. The mastoid air cells are unopacified.  No evidence of calvarial fracture.  IMPRESSION: No evidence of acute intracranial abnormality.  Chronic left frontal subdural hygroma. Underlying mass effect with 2-3 mm rightward midline shift.  Old left occipital infarct. Old left thalamic infarct. Extensive small vessel ischemic changes.   Electronically Signed   By: Julian Hy M.D.   On: 05/13/2014 11:47   Dg Chest Port 1 View  05/13/2014   CLINICAL DATA:  Altered mental status.  Cough for 1 day  EXAM: PORTABLE CHEST - 1 VIEW  COMPARISON:  June 02, 2014 and March 11, 2013  FINDINGS: There is no edema or consolidation. There is a stable 5 mm nodular opacity in the right upper lobe. Heart size and pulmonary vascularity are normal. No adenopathy. No pneumothorax. Patient is status post internal mammary bypass grafting.  IMPRESSION: No edema or consolidation. 5 mm nodular opacity right upper lobe. This finding is stable compared to most recent prior chest radiograph but was not present 1 year prior. In this circumstance, it advisable to obtain noncontrast enhanced chest CT to further assess.   Electronically Signed   By: Lowella Grip M.D.   On: 05/13/2014 16:23    Scheduled Meds: .  stroke: mapping our early stages of recovery book   Does not apply  Once  . aspirin  300 mg Rectal Daily  . cefTRIAXone (ROCEPHIN)  IV  1 g Intravenous Q24H  . dextrose      . glipiZIDE  10 mg Oral BID AC  . heparin  5,000 Units Subcutaneous 3 times per day  . insulin aspart  0-9 Units Subcutaneous Q4H  . pantoprazole (PROTONIX) IV  40 mg Intravenous Q12H  . sodium chloride  3 mL Intravenous Q12H  . valproate sodium  250 mg Intravenous Q12H   Continuous Infusions: . sodium chloride     Antibiotics Given (last 72 hours)    None      Principal Problem:   CVA (cerebral infarction) Active Problems:   Ischemic heart disease   Diabetes mellitus type 2, insulin dependent   Hypercholesterolemia   CKD (chronic kidney disease)   HTN (hypertension)   SDH (subdural hematoma)   Acute left-sided weakness  UTI (lower urinary tract infection)    Time spent: 53min    [name]  [facility] Pager [phone]. If 7PM-7AM, please contact night-coverage at [email or web address], password [facility] 05/14/2014, 8:55 AM  LOS: 1 day

## 2014-05-14 NOTE — Evaluation (Signed)
Physical Therapy Evaluation Patient Details Name: [name] MRN: [id number] DOB: [birth year] Today's Date: 05/14/2014   History of Present Illness  Pt is 80 y.o. male who was recently at the hospital on 04/03/2014 due to right CVA.  Pt has the following medical history:   DM II, UTI, CKD 4, Afib, CAD, Dementia.  Recent MRI showing:  Acute infarction affecting the left basal ganglia.  Per family he was not moving his left arm and leg well secondary to infarct but could converse well and was moving his right side well.  Clinical Impression  Pt admitted with new onset of weakness and non verbal with MRI showing left basal ganglia. Pt currently with functional limitations due to the deficits listed below (see PT Problem List). Pt with impaired overall cognition and unable to follow commands at this time with severe right side neglect.  Pt will benefit from skilled PT to increase their independence and safety with mobility to allow discharge to the venue listed below.     Follow Up Recommendations SNF    Equipment Recommendations       Recommendations for Other Services       Precautions / Restrictions Precautions Precautions: Fall      Mobility  Bed Mobility Overal bed mobility: Needs Assistance Bed Mobility: Supine to Sit;Sit to Supine     Supine to sit: Total assist;HOB elevated Sit to supine: Total assist;HOB elevated   General bed mobility comments: Needs total assist with bed mobility and continues to lean posterior.   Transfers                    Ambulation/Gait                Stairs            Wheelchair Mobility    Modified Rankin (Stroke Patients Only) Modified Rankin (Stroke Patients Only) Pre-Morbid Rankin Score: Severe disability Modified Rankin: Severe disability     Balance Overall balance assessment: Needs assistance Sitting-balance support: Feet supported;No upper extremity supported Sitting balance-Leahy Scale: Zero Sitting  balance - Comments: Impaired and unable to maintain balance with posterior lean.   Postural control: Posterior lean                                   Pertinent Vitals/Pain Pain Assessment:  (unable to report pain)    Home Living Family/patient expects to be discharged to:: Private residence Living Arrangements: Spouse/significant other;Children Available Help at Discharge: Family;Available 24 hours/day Type of Home: House Home Access: Stairs to enter Entrance Stairs-Rails: Right;Left;Can reach both Entrance Stairs-Number of Steps: 6 Home Layout: One level Home Equipment: Grab bars - tub/shower;Walker - 4 wheels;Shower seat;Cane - single point;Hand held shower head Additional Comments: Information obtained through PT from past PT evaluation on 04/04/2014.     Prior Function Level of Independence: Needs assistance         Comments: Pt was at SNF since last stroke on 04/03/14 however able to conversate and now unable to follow commands and non verbal with PT assessment.      Hand Dominance   Dominant Hand: Right    Extremity/Trunk Assessment               Lower Extremity Assessment: Difficult to assess due to impaired cognition         Communication   Communication: HOH;Receptive difficulties;Expressive difficulties  Cognition Arousal/Alertness: Awake/alert Behavior During  Therapy: Restless;Flat affect Overall Cognitive Status: Impaired/Different from baseline Area of Impairment: Following commands;Awareness       Following Commands:  (unable to follow commands)   Awareness: Intellectual   General Comments: Pt unable to follow commands and non verbal.  Pt with right neglect as well as left sided weakness from previous stroke.     General Comments      Exercises        Assessment/Plan    PT Assessment Patient needs continued PT services  PT Diagnosis Hemiplegia non-dominant side;Generalized weakness   PT Problem List Decreased  activity tolerance;Decreased strength;Decreased balance;Decreased mobility;Decreased knowledge of use of DME;Decreased safety awareness  PT Treatment Interventions Functional mobility training;Therapeutic activities;Therapeutic exercise;Balance training;Neuromuscular re-education;Cognitive remediation;Patient/family education   PT Goals (Current goals can be found in the Care Plan section) Acute Rehab PT Goals Patient Stated Goal: Unable to give goal due to cognitive impairment PT Goal Formulation: Patient unable to participate in goal setting Time For Goal Achievement: 05/28/14 Potential to Achieve Goals: Fair    Frequency Min 3X/week   Barriers to discharge        Co-evaluation               End of Session   Activity Tolerance: Patient limited by lethargy Patient left: in bed;with call bell/phone within reach;with bed alarm set Nurse Communication: Mobility status         Time: 1950-9326 PT Time Calculation (min) (ACUTE ONLY): 27 min   Charges:   PT Evaluation $Initial PT Evaluation Tier I: 1 Procedure PT Treatments $Therapeutic Activity: 8-22 mins   PT G Codes:          Channah Godeaux May 30, 2014, 1:58 PM   Antoine Poche, [place] DPT [phone]

## 2014-05-14 NOTE — Progress Notes (Signed)
STROKE TEAM PROGRESS NOTE   HISTORY  [name] is an 80 y.o. male who was recently at the hospital on 04/03/2014 and noted to have Multiple areas of restricted diffusion affecting the RIGHT anterior circulation consistent with acute infarction. Patient was discharged to a SNF. Per family he was not moving his left arm and leg well secondary to infarct but could converse well and was moving his right side well. HE was baseline yesterday but nursing staff at SNF noted he was non conversive today and would not follow commands. HE was noted to have new left eye deviation and head is held turned to the left. Currently he not following commands but responds to voice and painful stimuli.   Date last known well: Date: 05/12/2014 Time last known well: Unable to determine tPA Given: No: out of window   SUBJECTIVE (INTERVAL HISTORY) Daughter, granddaughter and wife at bedside. Pt condition not changed, he continued to be global aphasia, left side hemiplegia and right neglect and left eye deviation. He has right MCA and ACA stroke last month and found to have right ICA 60-79% stenosis. He had resultant left hemiplegia and discharged to NH. No intervention done due to his age and baseline dementia.  OBJECTIVE Temp:  [98.2 F (36.8 C)-98.8 F (37.1 C)] 98.7 F (37.1 C) (11/14 1706) Pulse Rate:  [74-88] 86 (11/14 1706) Cardiac Rhythm:  [-] Atrial fibrillation (11/14 0900) Resp:  [16-20] 20 (11/14 1706) BP: (146-178)/(70-89) 178/85 mmHg (11/14 1706) SpO2:  [100 %] 100 % (11/14 1706) Weight:  [140 lb 8 oz (63.73 kg)] 140 lb 8 oz (63.73 kg) (11/14 0500)   Recent Labs Lab 05/14/14 0048 05/14/14 0429 05/14/14 0757 05/14/14 1209 05/14/14 1701  GLUCAP 131* 85 90 94 99    Recent Labs Lab 05/13/14 1120 05/13/14 1150 05/14/14 0707  NA 148* 147 149*  K 4.8 4.6 4.6  CL 112 115* 113*  CO2 23  --  19  GLUCOSE 101* 103* 122*  BUN 29* 32* 30*  CREATININE 2.05* 2.20* 1.90*  CALCIUM 9.3  --  9.0     Recent Labs Lab 05/13/14 1120  AST 18  ALT 10  ALKPHOS 68  BILITOT 0.3  PROT 7.2  ALBUMIN 3.0*    Recent Labs Lab 05/13/14 1120 05/13/14 1150 05/14/14 0707  WBC 7.7  --  7.8  NEUTROABS 5.7  --   --   HGB 10.5* 12.2* 11.1*  HCT 32.7* 36.0* 34.3*  MCV 95.3  --  98.0  PLT 289  --  274   No results for input(s): CKTOTAL, CKMB, CKMBINDEX, TROPONINI in the last 168 hours.  Recent Labs  05/13/14 1120  LABPROT 14.6  INR 1.13    Recent Labs  05/13/14 1207  COLORURINE YELLOW  LABSPEC 1.014  PHURINE 5.5  GLUCOSEU 100*  HGBUR TRACE*  BILIRUBINUR NEGATIVE  KETONESUR NEGATIVE  PROTEINUR 30*  UROBILINOGEN 0.2  NITRITE NEGATIVE  LEUKOCYTESUR SMALL*       Component Value Date/Time   CHOL 152 05/14/2014 0707   TRIG 107 05/14/2014 0707   HDL 32* 05/14/2014 0707   CHOLHDL 4.8 05/14/2014 0707   VLDL 21 05/14/2014 0707   LDLCALC 99 05/14/2014 0707   Lab Results  Component Value Date   HGBA1C 5.4 04/02/2014      Component Value Date/Time   LABOPIA NONE DETECTED 05/13/2014 1207   COCAINSCRNUR NONE DETECTED 05/13/2014 1207   LABBENZ NONE DETECTED 05/13/2014 1207   AMPHETMU NONE DETECTED 05/13/2014 1207   THCU  NONE DETECTED 05/13/2014 1207   LABBARB NONE DETECTED 05/13/2014 1207     Recent Labs Lab 05/13/14 1120  ETH <11    Ct Head Wo Contrast 05/13/2014    No evidence of acute intracranial abnormality.   Chronic left frontal subdural hygroma. Underlying mass effect with 2-3 mm rightward midline shift.   Old left occipital infarct. Old left thalamic infarct.  Extensive small vessel ischemic changes.     Dg Chest Port 1 View 05/13/2014    No edema or consolidation. 5 mm nodular opacity right upper lobe. This finding is stable compared to most recent prior chest radiograph but was not present 1 year prior. In this circumstance, it advisable to obtain noncontrast enhanced chest CT to further assess.     MRI/MRA 05/14/2014 Acute infarction affecting  the left basal ganglia in the region of the putamen, caudate head and anterior limb internal capsule with sparing of the globus pallidus.  Mild swelling but no hemorrhage or mass effect. Extensive old ischemic changes elsewhere throughout the brain. Chronic low-density subdural collections, unchanged. Motion degraded MR angiography shows patency of the major vessels but no detail available beyond that.  CUS 04/02/14 - - The vertebral arteries appear patent with antegrade flow. - Findings consistent with 60 - 79 percent stenosis involving the right internal carotid artery. While still in the same range as the previous study, the plaque has increased and the velocities have increased and the ICA/CCA ratio has increased. - Findings consistent with 1-39 percent stenosis involving the left internal carotid artery.  2D echo - - Left ventricle: The cavity size was normal. There was mild concentric hypertrophy. Systolic function was probably normal. Doppler parameters are consistent with abnormal left ventricular relaxation (grade 1 diastolic dysfunction). - Mitral valve: There was mild regurgitation.  PHYSICAL EXAM Physical exam  Temp:  [98.2 F (36.8 C)-98.8 F (37.1 C)] 98.7 F (37.1 C) (11/14 1706) Pulse Rate:  [74-88] 86 (11/14 1706) Resp:  [16-20] 20 (11/14 1706) BP: (146-178)/(70-89) 178/85 mmHg (11/14 1706) SpO2:  [100 %] 100 % (11/14 1706) Weight:  [140 lb 8 oz (63.73 kg)] 140 lb 8 oz (63.73 kg) (11/14 0500)  General - Well nourished, well developed, in no apparent distress.  Ophthalmologic - not cooperative on exam.  Cardiovascular - Regular rate and rhythm with no murmur.  Neuro - lethargic, open eyes on voice but not able to keep eyes open, global aphasia, with mumbling sounds, not sensible. Blinking to both sides to visual threat but eyes deviated to left, not cross midline. PERRL. Facial bilateral nasolabial fold flatening. LUE flaccid, LLE trace withdraw to  pain, RUE and RLE move spontaneously, reflex 1+ and babinski b/l negative.   ASSESSMENT/PLAN [name] is a 80 y.o. male with history of coronary artery disease, dementia, diabetes mellitus, hypertension, hyperlipidemia, previous CVA in October, and chronic kidney disease  presenting with aphasia, left eye deviation and right neglect. He did not receive IV t-PA due to late presentation. He had right MCA and ACA stroke in October which was contributed to his right ICA stenosis. Due to was age and dementia, he was felt not a good candidate for surgical intervention, he was discharged to nursing home with left-sided hemiplegia. Currently he had a left MCA stroke, wheeze global aphasia, eye deviation, right-sided neglect, stroke etiology not clear. It could be due to small vessel disease, cardioembolic stroke, or malignancy related ( right upper lung nodule). However due to his current condition, he is not a good  candidate for further embolic workup and treatment with anticoagulation. At the long discussion with his daughter, she wants to discuss with her mom and the other family members regarding palliative care versus comfort care.  Stroke:  Dominant - left basal ganglia infarct, felt likely embolic combined with left MCA and ACA stroke in October. However, it also could be a separated small vessel event and last time stroke is due to right ICA stenosis (artery to artery emboli). It is also possible due to hypercoagulable state secondary to malignancy as he has right upper lung nodule which is not seen one year ago.     Resultant  - aphasia, eye deviation, neglect  MRI  Acute infarction affecting the left basal ganglia  MRA  Motion degraded but no major vessel occlusion.  Carotid Doppler - performed 04/02/2014 - 60-79% stenosis of the left internal carotid artery.   2D Echo - 04/02/2014 - no cardiac source of emboli identified  LDL - 99, not at goal  HgbA1c - pending  Subcutaneous heparin  for VTE prophylaxis  DIET - DYS 1 with no liquids, but doubt patient able to swallow  clopidogrel 75 mg orally every day prior to admission, now on aspirin suppository 300 mg daily  Ongoing aggressive risk factor management  Therapy recommendations:  pending  Disposition:  pending  Right carotid artery stenosis  Right internal carotid artery stenosis - 60-79% by Doppler   dual antiplatelet therapy was not recommended last admission secondary to history of subdural hematoma.  Continue aspirin suppository  Hypertension  Home meds: no antihypertensive medications prior to admission  Stable Permissive hypertension (OK if <220/120) for 24-48 hours post stroke and then gradually normalized within 5-7 days.  Hyperlipidemia  Home meds: Lipitor 20 mg daily - currently NPO  LDL 99, goal < 70   No reliable po access at this time  Consider resume lipitor once po access  Continue statin at discharge  Diabetes  HgbA1c pending goal < 7.0  Other Stroke Risk Factors  Advanced age  Hx stroke/TIA  Coronary artery disease  Other Active Problems  5 mm nodular opacity right upper lobe. Contrast enhanced chest CT is recommended. However, we will wait to family decision regarding goals of care  Other Pertinent History  Chronic kidney disease  Mild anemia  History of subdural hematoma  History of dementia  Hospital day # [place] PA-C [facility] Pager [phone] 05/14/2014, 6:00 PM  I, the attending vascular neurologist, have personally obtained a history, examined the patient, evaluated laboratory data, individually viewed imaging studies. Together with the NP/PA, we formulated the assessment and plan of care which reflects our mutual decision.  I have made any additions or clarifications directly to the above note and agree with the findings and plan as currently documented.   I had long discussion with his daughter, granddaughter, and  wife regarding patient's current condition, treatment options, and prognosis. He is not a good candidate for embolic stroke workup, and evaluation, and carries a poor prognosis. Daughter and the family wants to further discuss among themselves regarding patient goals the care.  Please note: All text in blue color in the note is my addition to the original note.   Rosalin Hawking, MD PhD Stroke Neurology 05/14/2014 6:05 PM   To contact Stroke Continuity provider, please refer to [email or web address] After hours, contact General Neurology

## 2014-05-14 NOTE — Evaluation (Signed)
Speech Language Pathology Evaluation Patient Details Name: [name] MRN: [id number] DOB: 1928/03/12 Today's Date: 05/14/2014 Time: 0130-0155 SLP Time Calculation (min) (ACUTE ONLY): 25 min  Problem List:  Patient Active Problem List   Diagnosis Date Noted  . CVA (cerebral infarction) 05/13/2014  . CVA (cerebral vascular accident)   . Cerebral thrombosis with cerebral infarction 04/02/2014  . Acute left-sided weakness 04/01/2014  . UTI (lower urinary tract infection) 04/01/2014  . Elevated troponin 04/01/2014  . SDH (subdural hematoma) 03/11/2013  . Anemia 08/21/2012  . HTN (hypertension) 07/04/2012  . CKD (chronic kidney disease) 12/05/2011  . Ischemic heart disease 02/04/2011  . Benign hypertensive heart disease without heart failure 02/04/2011  . Diabetes mellitus type 2, insulin dependent 02/04/2011  . Hypercholesterolemia 02/04/2011   Past Medical History:  Past Medical History  Diagnosis Date  . Coronary artery disease     post CABG in 1997 with abnormal nuclear stress test in 2007 which did demonstrate mild reversible ischemia and has been managed medically.   . Diabetes mellitus     type 2  . Hypertension   . Hyperlipidemia   . Hypercholesterolemia   . Atypical chest pain   . Hypernatremia   . History of hypoglycemia   . Syncope   . CVA (cerebral infarction)   . Allergy   . History of chicken pox   . Acute on chronic renal insufficiency   . CKD (chronic kidney disease)   . GERD (gastroesophageal reflux disease)   . H/O hiatal hernia    Past Surgical History:  Past Surgical History  Procedure Laterality Date  . Coronary artery bypass graft  1997    x3 -- patent internal mammary graft to saphenous vein grafts x3  . Tonsillectomy    . Cardiac catheterization  01/26/1999    patent internal mammary graft to saphenous vein grafts x3 -- singnificant three-vessel coroanry artery disease -- potential sites of ischemia involving the intermediate branch, circumflex  branch and the distal LAD through diffuse disease, as well as steal type syndrome -- normal LV function -- W. Doristine Church., M.D.    . Esophagogastroduodenoscopy  07/22/2012    Procedure: ESOPHAGOGASTRODUODENOSCOPY (EGD);  Surgeon: Beryle Beams, MD;  Location: [facility];  Service: Endoscopy;  Laterality: N/A;  . Colonoscopy  07/22/2012    Procedure: COLONOSCOPY;  Surgeon: Beryle Beams, MD;  Location: [facility];  Service: Endoscopy;  Laterality: N/A;   HPI:  [name]  is a 80 y.o. male, with history of old subdural hematoma, recent admission and discharge last month for right anterior circulation CVA, type 2 diabetes mellitus, seizures, CAD, right-sided carotid stenosis with 60-79% stenosis found last admission medical treatment per plan last admission, hypertension, dyslipidemia, UTIs who lives in a nursing home has been brought in with chief complaints of aphasia in being less than usually responsive, he does have chronic left-sided weakness but was conversing, exact time of onset unknown, brought to the ER where CT scan of the head was nonacute, he was seen by neurology who thought he probably had progression of his CVA versus change in mental status due to UTI. Pt was NPO prior to BSE/SLE and not vocalizing or following commands.    Assessment / Plan / Recommendation Clinical Impression   Pt presents with global aphasia with difficulty expressing self with gestures and/or verbalizations; pt unable to follow 1-step commands as well during SLE; limited assessment d/t pt ability at this time; pt was able to follow commands with maximum  assistance with daily tasks such as holding a cup or using a spoon while completing swallowing assessment, but otherwise, he had difficulty following any commands during evaluation.      SLP Assessment  Patient needs continued Speech Language Pathology Services    Follow Up Recommendations  Skilled Nursing facility and/or family assistance 24 hrs/day     Frequency and Duration min 2x/week  2 weeks   Pertinent Vitals/Pain Pain Assessment: Faces Pain Score: 0-No pain Faces Pain Scale: No hurt   SLP Goals  Potential to Achieve Goals (ACUTE ONLY): Fair Potential Considerations (ACUTE ONLY): Co-morbidities;Previous level of function;Severity of impairments  SLP Evaluation Prior Functioning  Cognitive/Linguistic Baseline: Baseline deficits Baseline deficit details: previous CVA Type of Home: House Available Help at Discharge: Family;Available 24 hours/day   Cognition  Overall Cognitive Status: Impaired/Different from baseline Arousal/Alertness: Awake/alert Orientation Level: Other (comment) (unable to assess)    Comprehension  Auditory Comprehension Overall Auditory Comprehension: Impaired Yes/No Questions: Impaired Commands: Impaired Conversation: Other (comment) Other Conversation Comments: unable to assess Visual Recognition/Discrimination Discrimination: Not tested Reading Comprehension Reading Status: Not tested    Expression Expression Primary Mode of Expression: Nonverbal - gestures Verbal Expression Overall Verbal Expression: Impaired Non-Verbal Means of Communication: Other (comment) (CNA) Written Expression Dominant Hand: Right Written Expression: Not tested   Oral / Motor Oral Motor/Sensory Function Overall Oral Motor/Sensory Function: Impaired at baseline Labial ROM: Other (Comment) Labial Symmetry: Other (Comment) Motor Speech Overall Motor Speech: Other (comment) (CNA) Respiration: Within functional limits Phonation: Other (comment) (CNA) Articulation: Impaired Intelligibility: Unable to assess (comment)        Carlisha Wisler,PAT, M.S, CCC-SLP 05/14/2014, 3:37 PM

## 2014-05-15 DIAGNOSIS — I63321 Cerebral infarction due to thrombosis of right anterior cerebral artery: Secondary | ICD-10-CM | POA: Diagnosis not present

## 2014-05-15 LAB — GLUCOSE, CAPILLARY
GLUCOSE-CAPILLARY: 76 mg/dL (ref 70–99)
GLUCOSE-CAPILLARY: 113 mg/dL — AB (ref 70–99)
Glucose-Capillary: 169 mg/dL — ABNORMAL HIGH (ref 70–99)
Glucose-Capillary: 135 mg/dL — ABNORMAL HIGH (ref 70–99)
Glucose-Capillary: 204 mg/dL — ABNORMAL HIGH (ref 70–99)
Glucose-Capillary: 206 mg/dL — ABNORMAL HIGH (ref 70–99)

## 2014-05-15 MED ORDER — INSULIN ASPART 100 UNIT/ML ~~LOC~~ SOLN
0.0000 [IU] | Freq: Three times a day (TID) | SUBCUTANEOUS | Status: DC
  Administered 2014-05-15: 3 [IU] via SUBCUTANEOUS
  Administered 2014-05-16: 2 [IU] via SUBCUTANEOUS

## 2014-05-15 MED ORDER — INSULIN ASPART 100 UNIT/ML ~~LOC~~ SOLN
0.0000 [IU] | Freq: Every day | SUBCUTANEOUS | Status: DC
Start: 2014-05-15 — End: 2014-05-16
  Administered 2014-05-15: 2 [IU] via SUBCUTANEOUS

## 2014-05-15 MED ORDER — ATORVASTATIN CALCIUM 10 MG PO TABS
20.0000 mg | ORAL_TABLET | Freq: Every day | ORAL | Status: DC
  Administered 2014-05-15: 20 mg via ORAL
  Filled 2014-05-15: qty 2

## 2014-05-15 NOTE — Progress Notes (Signed)
STROKE TEAM PROGRESS NOTE   HISTORY  [name] is an 80 y.o. male who was recently at the hospital on 04/03/2014 and noted to have Multiple areas of restricted diffusion affecting the RIGHT anterior circulation consistent with acute infarction. Patient was discharged to a SNF. Per family he was not moving his left arm and leg well secondary to infarct but could converse well and was moving his right side well. HE was baseline yesterday but nursing staff at SNF noted he was non conversive today and would not follow commands. HE was noted to have new left eye deviation and head is held turned to the left. Currently he not following commands but responds to voice and painful stimuli.   Date last known well: Date: 05/12/2014 Time last known well: Unable to determine tPA Given: No: out of window   SUBJECTIVE (INTERVAL HISTORY) No family members present today. The patient is poorly responsive. He does not follow any commands. He remains aphasic. Palliative care consult is pending. EKG showed afib but by my reviewing the tracing, NO afib but just poor baseline.   OBJECTIVE Temp:  [98.2 F (36.8 C)-99.9 F (37.7 C)] 99.9 F (37.7 C) (11/15 0540) Pulse Rate:  [58-99] 99 (11/15 0540) Cardiac Rhythm:  [-] Atrial fibrillation;Other (Comment) (11/14 2002) Resp:  [16-20] 20 (11/15 0540) BP: (146-178)/(82-100) 153/82 mmHg (11/15 0540) SpO2:  [97 %-100 %] 97 % (11/15 0540) Weight:  [144 lb 6.4 oz (65.499 kg)] 144 lb 6.4 oz (65.499 kg) (11/15 0141)   Recent Labs Lab 05/14/14 2002 05/15/14 0001 05/15/14 0136 05/15/14 0401 05/15/14 0745  GLUCAP 202* 171* 169* 135* 76    Recent Labs Lab 05/13/14 1120 05/13/14 1150 05/14/14 0707  NA 148* 147 149*  K 4.8 4.6 4.6  CL 112 115* 113*  CO2 23  --  19  GLUCOSE 101* 103* 122*  BUN 29* 32* 30*  CREATININE 2.05* 2.20* 1.90*  CALCIUM 9.3  --  9.0    Recent Labs Lab 05/13/14 1120  AST 18  ALT 10  ALKPHOS 68  BILITOT 0.3  PROT 7.2   ALBUMIN 3.0*    Recent Labs Lab 05/13/14 1120 05/13/14 1150 05/14/14 0707  WBC 7.7  --  7.8  NEUTROABS 5.7  --   --   HGB 10.5* 12.2* 11.1*  HCT 32.7* 36.0* 34.3*  MCV 95.3  --  98.0  PLT 289  --  274   No results for input(s): CKTOTAL, CKMB, CKMBINDEX, TROPONINI in the last 168 hours.  Recent Labs  05/13/14 1120  LABPROT 14.6  INR 1.13    Recent Labs  05/13/14 1207  COLORURINE YELLOW  LABSPEC 1.014  PHURINE 5.5  GLUCOSEU 100*  HGBUR TRACE*  BILIRUBINUR NEGATIVE  KETONESUR NEGATIVE  PROTEINUR 30*  UROBILINOGEN 0.2  NITRITE NEGATIVE  LEUKOCYTESUR SMALL*       Component Value Date/Time   CHOL 152 05/14/2014 0707   TRIG 107 05/14/2014 0707   HDL 32* 05/14/2014 0707   CHOLHDL 4.8 05/14/2014 0707   VLDL 21 05/14/2014 0707   LDLCALC 99 05/14/2014 0707   Lab Results  Component Value Date   HGBA1C 5.4 05/14/2014      Component Value Date/Time   LABOPIA NONE DETECTED 05/13/2014 1207   COCAINSCRNUR NONE DETECTED 05/13/2014 1207   LABBENZ NONE DETECTED 05/13/2014 1207   AMPHETMU NONE DETECTED 05/13/2014 1207   THCU NONE DETECTED 05/13/2014 1207   LABBARB NONE DETECTED 05/13/2014 1207     Recent Labs Lab 05/13/14 1120  ETH <11    Ct Head Wo Contrast 05/13/2014    No evidence of acute intracranial abnormality.   Chronic left frontal subdural hygroma. Underlying mass effect with 2-3 mm rightward midline shift.   Old left occipital infarct. Old left thalamic infarct.  Extensive small vessel ischemic changes.     Dg Chest Port 1 View 05/13/2014    No edema or consolidation. 5 mm nodular opacity right upper lobe. This finding is stable compared to most recent prior chest radiograph but was not present 1 year prior. In this circumstance, it advisable to obtain noncontrast enhanced chest CT to further assess.     MRI/MRA 05/14/2014 Acute infarction affecting the left basal ganglia in the region of the putamen, caudate head and anterior limb internal  capsule with sparing of the globus pallidus.  Mild swelling but no hemorrhage or mass effect. Extensive old ischemic changes elsewhere throughout the brain. Chronic low-density subdural collections, unchanged. Motion degraded MR angiography shows patency of the major vessels but no detail available beyond that.  CUS 04/02/14 - - The vertebral arteries appear patent with antegrade flow. - Findings consistent with 60 - 79 percent stenosis involving the right internal carotid artery. While still in the same range as the previous study, the plaque has increased and the velocities have increased and the ICA/CCA ratio has increased. - Findings consistent with 1-39 percent stenosis involving the left internal carotid artery.  2D echo - - Left ventricle: The cavity size was normal. There was mild concentric hypertrophy. Systolic function was probably normal. Doppler parameters are consistent with abnormal left ventricular relaxation (grade 1 diastolic dysfunction). - Mitral valve: There was mild regurgitation.  PHYSICAL EXAM Physical exam  Temp:  [98.2 F (36.8 C)-99.9 F (37.7 C)] 99.9 F (37.7 C) (11/15 0540) Pulse Rate:  [58-99] 99 (11/15 0540) Resp:  [16-20] 20 (11/15 0540) BP: (146-178)/(82-100) 153/82 mmHg (11/15 0540) SpO2:  [97 %-100 %] 97 % (11/15 0540) Weight:  [144 lb 6.4 oz (65.499 kg)] 144 lb 6.4 oz (65.499 kg) (11/15 0141)  General - Well nourished, well developed, in no apparent distress.  Ophthalmologic - not cooperative on exam.  Cardiovascular - Regular rate and rhythm with no murmur.  Neuro - lethargic, open eyes on voice but not able to keep eyes open, global aphasia, with mumbling sounds, not sensible. Blinking to both sides to visual threat but eyes deviated to left, not cross midline. PERRL. Facial bilateral nasolabial fold flatening. LUE flaccid, LLE trace withdraw to pain, RUE and RLE move spontaneously, reflex 1+ and babinski b/l  negative.   ASSESSMENT/PLAN [name] is a 80 y.o. male with history of coronary artery disease, dementia, diabetes mellitus, hypertension, hyperlipidemia, previous CVA in October, and chronic kidney disease  presenting with aphasia, left eye deviation and right neglect. He did not receive IV t-PA due to late presentation. He had right MCA and ACA stroke in October which was contributed to his right ICA stenosis. Due to was age and dementia, he was felt not a good candidate for surgical intervention, he was discharged to nursing home with left-sided hemiplegia. Currently he had a left MCA stroke, wheeze global aphasia, eye deviation, right-sided neglect, stroke etiology not clear. It could be due to small vessel disease, cardioembolic stroke, or malignancy related ( right upper lung nodule). However due to his current condition, he is not a good candidate for further embolic workup and treatment with anticoagulation. Had long discussion with his daughter, she wants to discuss with  her mom and the other family members regarding palliative care versus comfort care. Palliative care to see tomorrow.  Stroke:  Dominant - left basal ganglia infarct, felt likely embolic combined with left MCA and ACA stroke in October. However, it also could be a separated small vessel event and last time stroke is due to right ICA stenosis (artery to artery emboli). It is also possible due to hypercoagulable state secondary to malignancy as he has right upper lung nodule which is not seen one year ago. Her EKG today reported Afib but by my reading the tracing, there is NO afib but just poor baseline. Even if there is afib, pt is not a good candidate for anticoagulation due to poor functional status, advanced age and hx of SDH. Will not pursue further embolic stroke work up. Family to talk to palliative care in am.  Resultant  - aphasia, eye deviation, neglect  MRI  Acute infarction affecting the left basal ganglia  MRA   Motion degraded but no major vessel occlusion.  Carotid Doppler - performed 04/02/2014 - 60-79% stenosis of the left internal carotid artery.   2D Echo - 04/02/2014 - no cardiac source of emboli identified  LDL - 99, not at goal  HgbA1c - 5.4, on the goal  Subcutaneous heparin for VTE prophylaxis  DIET - DYS 1 with no liquids, but doubt patient able to swallow. He was just pocketing food this am when being fed.  clopidogrel 75 mg orally every day prior to admission, now on aspirin suppository 300 mg daily  Ongoing aggressive risk factor management  Therapy recommendations:  SNF  Disposition:  Palliative care consult is pending.  Right carotid artery stenosis  Right internal carotid artery stenosis - 60-79% by Doppler   dual antiplatelet therapy was not recommended last admission secondary to history of subdural hematoma.  Continue aspirin suppository  No surgical intervention will be pursued.   Hypertension  Home meds: no antihypertensive medications prior to admission  Stable       Permissive hypertension (OK if <220/120) for 24-48 hours post stroke and then gradually normalized within 5-7 days.  Hyperlipidemia  Home meds: Lipitor 20 mg daily - currently NPO  LDL 99, goal < 70   No reliable po access at this time  Palliative consult pending.  Diabetes  HgbA1c 5.4 goal < 7.0  Under control  Home med glipizide  SSI  Other Stroke Risk Factors  Advanced age  Hx stroke/TIA  Coronary artery disease  Other Active Problems  5 mm nodular opacity right upper lobe. Contrast enhanced chest CT is recommended. However, we will wait to family decision regarding goals of care  Other Pertinent History  Chronic kidney disease  Mild anemia  History of subdural hematoma  History of dementia   Hospital day # [place] PA-C [facility] Pager [phone] 05/15/2014, 7:53 AM  I, the attending vascular neurologist, have  personally obtained a history, examined the patient, evaluated laboratory data, individually viewed imaging studies. Together with the NP/PA, we formulated the assessment and plan of care which reflects our mutual decision.  I have made any additions or clarifications directly to the above note and agree with the findings and plan as currently documented.   I had long discussion with his daughter, granddaughter, and wife regarding patient's current condition, treatment options, and prognosis. He is not a good candidate for embolic stroke workup, and evaluation, and carries a poor prognosis. Daughter and the family wants to further  discuss among themselves regarding patient goals the care. Palliative care to see in am.  Please note: All text in blue color in the note is my addition to the original note.  The stroke team will sign off at this time. Please call if we can be of further service. Thank you for the consult   Rosalin Hawking, MD PhD Stroke Neurology 05/15/2014 7:53 AM   To contact Stroke Continuity provider, please refer to [email or web address] After hours, contact General Neurology

## 2014-05-15 NOTE — Progress Notes (Signed)
[facility] PROGRESS NOTE  [name] ZDG:[id number] DOB: 1928-05-14 DOA: 05/13/2014 PCP: Webb Silversmith, NP  Assessment/Plan: CVA -MRI: Acute infarction affecting the left basal ganglia in the region of the putamen, caudate head and anterior limb internal capsule with sparing of the globus pallidus. Mild swelling but no hemorrhage or mass effect. Extensive old ischemic changes elsewhere throughout the brain. -History of subdural bleed and recent right anterior circulation ischemic stroke 1 month ago, was placed on Plavix then. -swallow eval SLP 1- did well last night but not eating today- just pocketing the food -ECHO 10/15  with normal EF and wall motion, Carotid duplex: with R 40-69% stenosis PT: SNF  DM type II.  HbA1c 5.4 -SSI, stop glipizide  Dyslipidemia.  -Resume statin  UTI. IV Rocephin. No urine culture done (last 10/3 showed citrobacter  CKD 4: -creatinine better than recent baseline -change IVf to 1/2 NS due to hypernatremia  Right carotid 60-79% stenosis found last stroke workup a few months ago. At that time it was decided that due to his poor functional status medical treatment only with antiplatelet medications and statin.   P A Fib on EKG.  -but very poor baseline, repeat EKG. Would be a poor candidate for anticoagulation due to history of subdural bleed and extremely poor functional status  CAD - stable  Dementia -on depakote sprinkles at SNF, now IV  Poor functional status and DO NOT RESUSCITATE status Family agreeable to have a palliative care meeting after speaking with Neurology yesterday: spoke with Dr. Deitra Mayo, will not be seen until tomm, consulted social work for residential hospice eval- passed swallow but high risk of aspiration- not eating this AM   DVT Prophylaxis Heparin   Condition Poor prognosis   Code Status: DNR Family Communication: spoke with daughter on phone Disposition Plan:  hospice?   Consultants:  Neuro  Palliative care  HPI/Subjective: Getting bath  Objective: Filed Vitals:   05/15/14 0540  BP: 153/82  Pulse: 99  Temp: 99.9 F (37.7 C)  Resp: 20    Intake/Output Summary (Last 24 hours) at 05/15/14 0855 Last data filed at 05/15/14 0602  Gross per 24 hour  Intake    483 ml  Output    900 ml  Net   -417 ml   Filed Weights   05/14/14 0500 05/15/14 0141  Weight: 63.73 kg (140 lb 8 oz) 65.499 kg (144 lb 6.4 oz)    Exam:   General:  Elderly male, Awake  Cardiovascular: S1S2/Irregular rate and rhythm  Respiratory: CTAB  Abdomen: soft, Nt, BS present  Musculoskeletal: no edema c/c  Neuro:   Does not move Left side, moves R side spontaneously, will not follow commands to assess strength  Data Reviewed: Basic Metabolic Panel:  Recent Labs Lab 05/13/14 1120 05/13/14 1150 05/14/14 0707  NA 148* 147 149*  K 4.8 4.6 4.6  CL 112 115* 113*  CO2 23  --  19  GLUCOSE 101* 103* 122*  BUN 29* 32* 30*  CREATININE 2.05* 2.20* 1.90*  CALCIUM 9.3  --  9.0   Liver Function Tests:  Recent Labs Lab 05/13/14 1120  AST 18  ALT 10  ALKPHOS 68  BILITOT 0.3  PROT 7.2  ALBUMIN 3.0*   No results for input(s): LIPASE, AMYLASE in the last 168 hours. No results for input(s): AMMONIA in the last 168 hours. CBC:  Recent Labs Lab 05/13/14 1120 05/13/14 1150 05/14/14 0707  WBC 7.7  --  7.8  NEUTROABS 5.7  --   --  HGB 10.5* 12.2* 11.1*  HCT 32.7* 36.0* 34.3*  MCV 95.3  --  98.0  PLT 289  --  274   Cardiac Enzymes: No results for input(s): CKTOTAL, CKMB, CKMBINDEX, TROPONINI in the last 168 hours. BNP (last 3 results) No results for input(s): PROBNP in the last 8760 hours. CBG:  Recent Labs Lab 05/14/14 2002 05/15/14 0001 05/15/14 0136 05/15/14 0401 05/15/14 0745  GLUCAP 202* 171* 169* 135* 76    No results found for this or any previous visit (from the past 240 hour(s)).   Studies: Ct Head Wo  Contrast  05/13/2014   CLINICAL DATA:  Stroke-like symptoms, altered mental status, fixed leftward gaze  EXAM: CT HEAD WITHOUT CONTRAST  TECHNIQUE: Contiguous axial images were obtained from the base of the skull through the vertex without intravenous contrast.  COMPARISON:  MRI brain dated 04/02/2014.  CT head dated 04/01/2012.  FINDINGS: Chronic left frontal subdural hygroma, measuring 17 mm in thickness, previously 19 mm. No evidence of parenchymal hemorrhage.  Mild mass effect with underlying sulcal effacement. 2-3 mm rightward midline shift.  Possible thin right frontal subdural hygroma without mass effect, chronic.  No mass lesion.  No CT evidence of acute infarction.  Extensive small vessel ischemic changes. Old left thalamic lacunar infarct. Old left occipital infarct.  Global cortical and central atrophy.  No ventriculomegaly.  The visualized paranasal sinuses are essentially clear. The mastoid air cells are unopacified.  No evidence of calvarial fracture.  IMPRESSION: No evidence of acute intracranial abnormality.  Chronic left frontal subdural hygroma. Underlying mass effect with 2-3 mm rightward midline shift.  Old left occipital infarct. Old left thalamic infarct. Extensive small vessel ischemic changes.   Electronically Signed   By: Julian Hy M.D.   On: 05/13/2014 11:47   Mr Jodene Nam Head Wo Contrast  05/14/2014   CLINICAL DATA:  Aphasia and decreased responsiveness. Chronic left-sided weakness. Personal history of stroke and subdural collection.  EXAM: MRI HEAD WITHOUT CONTRAST  MRA HEAD WITHOUT CONTRAST  TECHNIQUE: Multiplanar, multiecho pulse sequences of the brain and surrounding structures were obtained without intravenous contrast. Angiographic images of the head were obtained using MRA technique without contrast.  COMPARISON:  CT 05/13/2014.  MRI 04/02/2014.  FINDINGS: MRI HEAD FINDINGS  Diffusion imaging shows acute infarction affecting the left putamen, caudate and anterior limb  internal capsule. Sparing of the globus pallidus. No evidence of hemorrhage or mass effect. No other acute infarction.  Extensive chronic brain atrophy persists with confluent chronic small vessel disease throughout the white matter. Old cortical infarctions in the parieto-occipital in frontal regions appear the same. Chronic low-density subdural collections persist, 1.8 cm thick on the left and 1.1 cm thick on the right. No evidence of obstructive hydrocephalus. No mass lesion. No inflammatory sinus disease.  MRA HEAD FINDINGS  The study is degraded by motion.  Both internal carotid arteries are patent into the brain. There is detectable flow a in the anterior and middle cerebral arteries bilaterally. Beyond that, detail is not present.  Both vertebral arteries are patent to the basilar. There is flow in the basilar artery and in both posterior cerebral arteries. Again, beyond that, detail is obscured by motion.  IMPRESSION: Acute infarction affecting the left basal ganglia in the region of the putamen, caudate head and anterior limb internal capsule with sparing of the globus pallidus. Mild swelling but no hemorrhage or mass effect.  Extensive old ischemic changes elsewhere throughout the brain.  Chronic low-density subdural collections, unchanged.  Motion  degraded MR angiography shows patency of the major vessels but no detail available beyond that.   Electronically Signed   By: Nelson Chimes M.D.   On: 05/14/2014 11:16   [name]  05/14/2014   CLINICAL DATA:  Aphasia and decreased responsiveness. Chronic left-sided weakness. Personal history of stroke and subdural collection.  EXAM: MRI HEAD WITHOUT CONTRAST  MRA HEAD WITHOUT CONTRAST  TECHNIQUE: Multiplanar, multiecho pulse sequences of the brain and surrounding structures were obtained without intravenous contrast. Angiographic images of the head were obtained using MRA technique without contrast.  COMPARISON:  CT 05/13/2014.  MRI 04/02/2014.   FINDINGS: MRI HEAD FINDINGS  Diffusion imaging shows acute infarction affecting the left putamen, caudate and anterior limb internal capsule. Sparing of the globus pallidus. No evidence of hemorrhage or mass effect. No other acute infarction.  Extensive chronic brain atrophy persists with confluent chronic small vessel disease throughout the white matter. Old cortical infarctions in the parieto-occipital in frontal regions appear the same. Chronic low-density subdural collections persist, 1.8 cm thick on the left and 1.1 cm thick on the right. No evidence of obstructive hydrocephalus. No mass lesion. No inflammatory sinus disease.  MRA HEAD FINDINGS  The study is degraded by motion.  Both internal carotid arteries are patent into the brain. There is detectable flow a in the anterior and middle cerebral arteries bilaterally. Beyond that, detail is not present.  Both vertebral arteries are patent to the basilar. There is flow in the basilar artery and in both posterior cerebral arteries. Again, beyond that, detail is obscured by motion.  IMPRESSION: Acute infarction affecting the left basal ganglia in the region of the putamen, caudate head and anterior limb internal capsule with sparing of the globus pallidus. Mild swelling but no hemorrhage or mass effect.  Extensive old ischemic changes elsewhere throughout the brain.  Chronic low-density subdural collections, unchanged.  Motion degraded MR angiography shows patency of the major vessels but no detail available beyond that.   Electronically Signed   By: Nelson Chimes M.D.   On: 05/14/2014 11:16   Dg Chest Port 1 View  05/13/2014   CLINICAL DATA:  Altered mental status.  Cough for 1 day  EXAM: PORTABLE CHEST - 1 VIEW  COMPARISON:  June 02, 2014 and March 11, 2013  FINDINGS: There is no edema or consolidation. There is a stable 5 mm nodular opacity in the right upper lobe. Heart size and pulmonary vascularity are normal. No adenopathy. No pneumothorax.  Patient is status post internal mammary bypass grafting.  IMPRESSION: No edema or consolidation. 5 mm nodular opacity right upper lobe. This finding is stable compared to most recent prior chest radiograph but was not present 1 year prior. In this circumstance, it advisable to obtain noncontrast enhanced chest CT to further assess.   Electronically Signed   By: Lowella Grip M.D.   On: 05/13/2014 16:23    Scheduled Meds: . aspirin  300 mg Rectal Daily  . cefTRIAXone (ROCEPHIN)  IV  1 g Intravenous Q24H  . heparin  5,000 Units Subcutaneous 3 times per day  . Influenza vac split quadrivalent PF  0.5 mL Intramuscular Tomorrow-1000  . insulin aspart  0-9 Units Subcutaneous Q4H  . pantoprazole (PROTONIX) IV  40 mg Intravenous Q12H  . sodium chloride  3 mL Intravenous Q12H  . valproate sodium  125 mg Intravenous Q12H   Continuous Infusions: . sodium chloride 50 mL/hr at 05/14/14 1232   Antibiotics Given (last 72 hours)  Date/Time Action Medication Dose Rate   05/14/14 1712 Given   cefTRIAXone (ROCEPHIN) 1 g in dextrose 5 % 50 mL IVPB - Premix 1 g 100 mL/hr      Principal Problem:   CVA (cerebral infarction) Active Problems:   Ischemic heart disease   Diabetes mellitus type 2, insulin dependent   Hypercholesterolemia   CKD (chronic kidney disease)   HTN (hypertension)   SDH (subdural hematoma)   Acute left-sided weakness   UTI (lower urinary tract infection)    Time spent: 74min    Eulogio Bear  [facility] Pager [phone] If 7PM-7AM, please contact night-coverage at [email or web address], password [facility] 05/15/2014, 8:55 AM  LOS: 2 days

## 2014-05-15 NOTE — Progress Notes (Signed)
Occupational Therapy Evaluation Patient Details Name: [name] MRN: [id number] DOB: [birth year] Today's Date: 05/15/2014    History of Present Illness Pt is 80 y.o. male who was recently at the hospital on 04/03/2014 due to right CVA.  Pt has the following medical history:   DM II, UTI, CKD 4, Afib, CAD, Dementia.  Recent MRI showing:  Acute infarction affecting the left basal ganglia.  Per family he was not moving his left arm and leg well secondary to infarct but could converse well and was moving his right side well.   Clinical Impression   PTA pt was at SNF (per PT note) and was able to converse and move his Right side. Pt currently unable to follow directions or verbalize. Pt unable to shake head yes/no to answer questions. Pt did smile in response to OT speaking to him. Feel that safest d/c venue is SNF at this time. All further OT needs to be met at [facility].     Follow Up Recommendations  SNF;Supervision/Assistance - 24 hour    Equipment Recommendations  Other (comment) (Defer to SNF)    Recommendations for Other Services       Precautions / Restrictions Precautions Precautions: Fall Restrictions Weight Bearing Restrictions: No      Mobility Bed Mobility Overal bed mobility: Needs Assistance Bed Mobility: Supine to Sit;Sit to Supine     Supine to sit: Total assist;HOB elevated Sit to supine: Total assist;HOB elevated   General bed mobility comments: Needs total assist with bed mobility and continues to lean posterior.   Transfers                 General transfer comment: Deferred due to safety at this time. Pt will require lift.          ADL Overall ADL's : Needs assistance/impaired                                       General ADL Comments: Pt is currently total A for all ADLs. Pt is not following commands to participate in ADLs.      Vision                 Additional Comments: Unable to be completed due to decreased  cognition. Pt followed OT around room somewhat with his head/eyes when spoken to.    Perception Perception Perception Tested?: No   Praxis Praxis Praxis tested?: Not tested    Pertinent Vitals/Pain Pain Assessment: Faces Pain Score: 0-No pain     Hand Dominance Right   Extremity/Trunk Assessment Upper Extremity Assessment Upper Extremity Assessment: Difficult to assess due to impaired cognition   Lower Extremity Assessment Lower Extremity Assessment: Defer to PT evaluation       Communication Communication Communication: HOH;Receptive difficulties;Expressive difficulties   Cognition Arousal/Alertness: Awake/alert Behavior During Therapy: Restless;Flat affect Overall Cognitive Status: Impaired/Different from baseline Area of Impairment: Following commands;Awareness       Following Commands:  (unable to follow commands)   Awareness: Intellectual   General Comments: Pt unable to follow commands and non verbal. Pt with right neglect as well as left sided weakness from previous stroke.               Home Living Family/patient expects to be discharged to:: Skilled nursing facility  Prior Functioning/Environment Level of Independence: Needs assistance        Comments: Per PT note: pt was at SNF since last stroke on 04/03/14 however able to conversate and now unable to follow commands and non verbal with PT assessment.     OT Diagnosis: Generalized weakness;Cognitive deficits;Hemiplegia dominant side    End of Session Nurse Communication: Need for lift equipment  Activity Tolerance: Patient tolerated treatment well Patient left: in bed;with call bell/phone within reach   Time: 3685-9923 OT Time Calculation (min): 20 min Charges:  OT General Charges $OT Visit: 1 Procedure OT Evaluation $Initial OT Evaluation Tier I: 1 Procedure OT Treatments $Self Care/Home Management : 8-22 mins  Villa Herb  M 05/15/2014, 2:36 PM  Cyndie Chime, OTR/L Occupational Therapist [phone] (pager)

## 2014-05-15 NOTE — Plan of Care (Signed)
Problem: Acute Treatment Outcomes Goal: Other Acute Treatment Outcomes Outcome: Not Applicable Date Met:  99/24/26  Problem: Progression Outcomes Goal: Other Progression Outcomes Outcome: Not Applicable Date Met:  83/41/96

## 2014-05-15 NOTE — Progress Notes (Signed)
Utilization Review Completed.   Gradie Ohm, RN, BSN Nurse Case Manager  

## 2014-05-16 DIAGNOSIS — R531 Weakness: Secondary | ICD-10-CM

## 2014-05-16 DIAGNOSIS — R1314 Dysphagia, pharyngoesophageal phase: Secondary | ICD-10-CM

## 2014-05-16 DIAGNOSIS — Z7189 Other specified counseling: Secondary | ICD-10-CM

## 2014-05-16 DIAGNOSIS — Z515 Encounter for palliative care: Secondary | ICD-10-CM

## 2014-05-16 LAB — BASIC METABOLIC PANEL
ANION GAP: 13 (ref 5–15)
BUN: 41 mg/dL — ABNORMAL HIGH (ref 6–23)
CO2: 20 mEq/L (ref 19–32)
Calcium: 8.5 mg/dL (ref 8.4–10.5)
Chloride: 115 mEq/L — ABNORMAL HIGH (ref 96–112)
Creatinine, Ser: 2.17 mg/dL — ABNORMAL HIGH (ref 0.50–1.35)
GFR calc Af Amer: 30 mL/min — ABNORMAL LOW (ref >90)
GFR, EST NON AFRICAN AMERICAN: 26 mL/min — AB (ref >90)
GLUCOSE: 208 mg/dL — AB (ref 70–99)
POTASSIUM: 4.7 meq/L (ref 3.7–5.3)
SODIUM: 148 meq/L — AB (ref 137–147)

## 2014-05-16 LAB — GLUCOSE, CAPILLARY
GLUCOSE-CAPILLARY: 111 mg/dL — AB (ref 70–99)
GLUCOSE-CAPILLARY: 186 mg/dL — AB (ref 70–99)
GLUCOSE-CAPILLARY: 156 mg/dL — AB (ref 70–99)
Glucose-Capillary: 170 mg/dL — ABNORMAL HIGH (ref 70–99)

## 2014-05-16 LAB — CBC
HCT: 31.4 % — ABNORMAL LOW (ref 39.0–52.0)
Hemoglobin: 10.3 g/dL — ABNORMAL LOW (ref 13.0–17.0)
MCH: 31.8 pg (ref 26.0–34.0)
MCHC: 32.8 g/dL (ref 30.0–36.0)
MCV: 96.9 fL (ref 78.0–100.0)
PLATELETS: 222 10*3/uL (ref 150–400)
RBC: 3.24 MIL/uL — ABNORMAL LOW (ref 4.22–5.81)
RDW: 14.3 % (ref 11.5–15.5)
WBC: 10.5 10*3/uL (ref 4.0–10.5)

## 2014-05-16 MED ORDER — BISACODYL 10 MG RE SUPP: 10.0000 mg | Freq: Every day | RECTAL | Status: DC | PRN

## 2014-05-16 MED ORDER — LORAZEPAM 1 MG PO TABS: 1.0000 mg | ORAL_TABLET | ORAL | Status: DC | PRN

## 2014-05-16 MED ORDER — MORPHINE SULFATE (CONCENTRATE) 10 MG/0.5ML PO SOLN: 5.0000 mg | ORAL | Status: DC | PRN

## 2014-05-16 NOTE — Progress Notes (Signed)
[facility] PROGRESS NOTE  [name] BPZ:[id number] DOB: Oct 30, 1927 DOA: 05/13/2014 PCP: Webb Silversmith, NP  [name] is a 80 y.o. male, with history of old subdural hematoma, recent admission and discharge last month for right anterior circulation CVA, type 2 diabetes mellitus, seizures, CAD, right-sided carotid stenosis with 60-79% stenosis found last admission medical treatment per plan last admission, hypertension, dyslipidemia, UTIs who lives in a nursing home has been brought in with chief complaints of aphasia in being less than usually responsive, he does have chronic left-sided weakness but was conversing, exact time of onset unknown, brought to the ER where CT scan of the head was nonacute, he was seen by neurology who thought he probably had progression of his CVA versus change in mental status due to UTI. Aspirin rectal suppository was ordered by neurology and we were requested to admit, he recently had full stroke workup and at this time neurology has recommended not to repeat.   Assessment/Plan: CVA -MRI: Acute infarction affecting the left basal ganglia in the region of the putamen, caudate head and anterior limb internal capsule with sparing of the globus pallidus. Mild swelling but no hemorrhage or mass effect. Extensive old ischemic changes elsewhere throughout the brain. -History of subdural bleed and recent right anterior circulation ischemic stroke 1 month ago, was placed on Plavix then. -swallow eval SLP 1- at times just pocketing the food -ECHO 10/15  with normal EF and wall motion, Carotid duplex: with R 40-69% stenosis PT: SNF vs inpatient hospice  DM type II.  HbA1c 5.4 -SSI, stop glipizide  Dyslipidemia.  -Resume statin  UTI. IV Rocephin. No urine culture done (last 10/3 showed citrobacter)  CKD 4: -creatinine better than recent baseline -change IVf to 1/2 NS due to hypernatremia  Right carotid 60-79% stenosis found last stroke workup a few months  ago. At that time it was decided that due to his poor functional status medical treatment only with antiplatelet medications and statin.   P A Fib on EKG.  -but very poor baseline, repeat EKG. Would be a poor candidate for anticoagulation due to history of subdural bleed and extremely poor functional status  CAD - stable  Dementia -on depakote sprinkles at SNF, now IV  Poor functional status and DO NOT RESUSCITATE status Family agreeable to have a palliative care meeting after speaking with Neurology yesterday: spoke with Dr. Deitra Mayo, will not be seen until tomm, consulted social work for residential hospice eval- passed swallow but high risk of aspiration- not eating this AM   DVT Prophylaxis Heparin   Condition Poor prognosis   Code Status: DNR Family Communication: spoke with daughter on phone Disposition Plan: hospice?- palliative care consult for eval today   Consultants:  Neuro  Palliative care  HPI/Subjective: Not speaking  Objective: Filed Vitals:   05/16/14 0640  BP: 130/70  Pulse: 87  Temp: 98 F (36.7 C)  Resp: 18    Intake/Output Summary (Last 24 hours) at 05/16/14 0834 Last data filed at 05/16/14 0657  Gross per 24 hour  Intake    963 ml  Output   1600 ml  Net   -637 ml   Filed Weights   05/14/14 0500 05/15/14 0141 05/16/14 0500  Weight: 63.73 kg (140 lb 8 oz) 65.499 kg (144 lb 6.4 oz) 65.545 kg (144 lb 8 oz)    Exam:   General:  Elderly male, Awake  Cardiovascular: S1S2/Irregular rate and rhythm  Respiratory: CTAB  Abdomen: soft, Nt, BS present  Musculoskeletal: no  edema c/c  Neuro:   Does not move Left side, moves R side spontaneously, will not follow commands to assess strength  Data Reviewed: Basic Metabolic Panel:  Recent Labs Lab 05/13/14 1120 05/13/14 1150 05/14/14 0707 05/16/14 0048  NA 148* 147 149* 148*  K 4.8 4.6 4.6 4.7  CL 112 115* 113* 115*  CO2 23  --  19 20  GLUCOSE 101* 103* 122* 208*  BUN 29* 32* 30*  41*  CREATININE 2.05* 2.20* 1.90* 2.17*  CALCIUM 9.3  --  9.0 8.5   Liver Function Tests:  Recent Labs Lab 05/13/14 1120  AST 18  ALT 10  ALKPHOS 68  BILITOT 0.3  PROT 7.2  ALBUMIN 3.0*   No results for input(s): LIPASE, AMYLASE in the last 168 hours. No results for input(s): AMMONIA in the last 168 hours. CBC:  Recent Labs Lab 05/13/14 1120 05/13/14 1150 05/14/14 0707 05/16/14 0048  WBC 7.7  --  7.8 10.5  NEUTROABS 5.7  --   --   --   HGB 10.5* 12.2* 11.1* 10.3*  HCT 32.7* 36.0* 34.3* 31.4*  MCV 95.3  --  98.0 96.9  PLT 289  --  274 222   Cardiac Enzymes: No results for input(s): CKTOTAL, CKMB, CKMBINDEX, TROPONINI in the last 168 hours. BNP (last 3 results) No results for input(s): PROBNP in the last 8760 hours. CBG:  Recent Labs Lab 05/15/14 0745 05/15/14 1126 05/15/14 1629 05/15/14 2122 05/16/14 0639  GLUCAP 76 113* 204* 206* 170*    No results found for this or any previous visit (from the past 240 hour(s)).   Studies: Mr Virgel Paling Wo Contrast  05/14/2014   CLINICAL DATA:  Aphasia and decreased responsiveness. Chronic left-sided weakness. Personal history of stroke and subdural collection.  EXAM: MRI HEAD WITHOUT CONTRAST  MRA HEAD WITHOUT CONTRAST  TECHNIQUE: Multiplanar, multiecho pulse sequences of the brain and surrounding structures were obtained without intravenous contrast. Angiographic images of the head were obtained using MRA technique without contrast.  COMPARISON:  CT 05/13/2014.  MRI 04/02/2014.  FINDINGS: MRI HEAD FINDINGS  Diffusion imaging shows acute infarction affecting the left putamen, caudate and anterior limb internal capsule. Sparing of the globus pallidus. No evidence of hemorrhage or mass effect. No other acute infarction.  Extensive chronic brain atrophy persists with confluent chronic small vessel disease throughout the white matter. Old cortical infarctions in the parieto-occipital in frontal regions appear the same. Chronic  low-density subdural collections persist, 1.8 cm thick on the left and 1.1 cm thick on the right. No evidence of obstructive hydrocephalus. No mass lesion. No inflammatory sinus disease.  MRA HEAD FINDINGS  The study is degraded by motion.  Both internal carotid arteries are patent into the brain. There is detectable flow a in the anterior and middle cerebral arteries bilaterally. Beyond that, detail is not present.  Both vertebral arteries are patent to the basilar. There is flow in the basilar artery and in both posterior cerebral arteries. Again, beyond that, detail is obscured by motion.  IMPRESSION: Acute infarction affecting the left basal ganglia in the region of the putamen, caudate head and anterior limb internal capsule with sparing of the globus pallidus. Mild swelling but no hemorrhage or mass effect.  Extensive old ischemic changes elsewhere throughout the brain.  Chronic low-density subdural collections, unchanged.  Motion degraded MR angiography shows patency of the major vessels but no detail available beyond that.   Electronically Signed   By: Jan Fireman.D.  On: 05/14/2014 11:16   [name]  05/14/2014   CLINICAL DATA:  Aphasia and decreased responsiveness. Chronic left-sided weakness. Personal history of stroke and subdural collection.  EXAM: MRI HEAD WITHOUT CONTRAST  MRA HEAD WITHOUT CONTRAST  TECHNIQUE: Multiplanar, multiecho pulse sequences of the brain and surrounding structures were obtained without intravenous contrast. Angiographic images of the head were obtained using MRA technique without contrast.  COMPARISON:  CT 05/13/2014.  MRI 04/02/2014.  FINDINGS: MRI HEAD FINDINGS  Diffusion imaging shows acute infarction affecting the left putamen, caudate and anterior limb internal capsule. Sparing of the globus pallidus. No evidence of hemorrhage or mass effect. No other acute infarction.  Extensive chronic brain atrophy persists with confluent chronic small vessel disease  throughout the white matter. Old cortical infarctions in the parieto-occipital in frontal regions appear the same. Chronic low-density subdural collections persist, 1.8 cm thick on the left and 1.1 cm thick on the right. No evidence of obstructive hydrocephalus. No mass lesion. No inflammatory sinus disease.  MRA HEAD FINDINGS  The study is degraded by motion.  Both internal carotid arteries are patent into the brain. There is detectable flow a in the anterior and middle cerebral arteries bilaterally. Beyond that, detail is not present.  Both vertebral arteries are patent to the basilar. There is flow in the basilar artery and in both posterior cerebral arteries. Again, beyond that, detail is obscured by motion.  IMPRESSION: Acute infarction affecting the left basal ganglia in the region of the putamen, caudate head and anterior limb internal capsule with sparing of the globus pallidus. Mild swelling but no hemorrhage or mass effect.  Extensive old ischemic changes elsewhere throughout the brain.  Chronic low-density subdural collections, unchanged.  Motion degraded MR angiography shows patency of the major vessels but no detail available beyond that.   Electronically Signed   By: Nelson Chimes M.D.   On: 05/14/2014 11:16    Scheduled Meds: . aspirin  300 mg Rectal Daily  . atorvastatin  20 mg Oral q1800  . cefTRIAXone (ROCEPHIN)  IV  1 g Intravenous Q24H  . heparin  5,000 Units Subcutaneous 3 times per day  . insulin aspart  0-5 Units Subcutaneous QHS  . insulin aspart  0-9 Units Subcutaneous TID WC  . pantoprazole (PROTONIX) IV  40 mg Intravenous Q12H  . sodium chloride  3 mL Intravenous Q12H  . valproate sodium  125 mg Intravenous Q12H   Continuous Infusions: . sodium chloride 50 mL/hr at 05/15/14 1347   Antibiotics Given (last 72 hours)    Date/Time Action Medication Dose Rate   05/14/14 1712 Given   cefTRIAXone (ROCEPHIN) 1 g in dextrose 5 % 50 mL IVPB - Premix 1 g 100 mL/hr   05/15/14 1656  Given   cefTRIAXone (ROCEPHIN) 1 g in dextrose 5 % 50 mL IVPB - Premix 1 g 100 mL/hr      Principal Problem:   CVA (cerebral infarction) Active Problems:   Ischemic heart disease   Diabetes mellitus type 2, insulin dependent   Hypercholesterolemia   CKD (chronic kidney disease)   HTN (hypertension)   SDH (subdural hematoma)   Acute left-sided weakness   UTI (lower urinary tract infection)    Time spent: 54min    Eulogio Bear  [facility] Pager [phone] If 7PM-7AM, please contact night-coverage at [email or web address], password [email or web address] 05/16/2014, 8:34 AM  LOS: 3 days

## 2014-05-16 NOTE — Progress Notes (Signed)
Speech Language Pathology Treatment: Dysphagia  Patient Details Name: [name] MRN: [id number] DOB: 1928-03-24 Today's Date: 05/16/2014 Time: 0915-0930 SLP Time Calculation (min) (ACUTE ONLY): 15 min  Assessment / Plan / Recommendation Clinical Impression  Skilled treatment session focused on addressing dysphagia goals.  Upon SLP arrival patient asleep; SLP utilized muti-modal cuing and environmental modifications to facilitate arousal.  Patient required Total hand-over-hand assist to complete oral care via suctioning and to self-feed a trial of thin liquids via cup and a trial of puree via teaspoon.  Patient demonstrated oral holding with no swallow initiation despite Max multi-modal cues.  As a result, SLP suctioned patient's oral cavity.  Patient then demonstrated resistance to further PO attempts with resisting self-feeding with hand-over-hand assist and by producing a firm bilabial seal.  Nurse tech was notified that PO assist should take place when patient is accepting of PO.  Given that no overt s/s of aspiration were observed today and that patient appears to tolerate current diet when he does consume it recommend to continue with current orders.      HPI HPI: [name]  is a 80 y.o. male, with history of old subdural hematoma, recent admission and discharge last month for right anterior circulation CVA, type 2 diabetes mellitus, seizures, CAD, right-sided carotid stenosis with 60-79% stenosis found last admission medical treatment per plan last admission, hypertension, dyslipidemia, UTIs who lives in a nursing home has been brought in with chief complaints of aphasia in being less than usually responsive, he does have chronic left-sided weakness but was conversing, exact time of onset unknown, brought to the ER where CT scan of the head was nonacute, he was seen by neurology who thought he probably had progression of his CVA versus change in mental status due to UTI. Pt was NPO prior to  BSE/SLE and not vocalizing or following commands.    Pertinent Vitals Pain Assessment: Faces Faces Pain Scale: No hurt  SLP Plan  Continue with current plan of care    Recommendations Diet recommendations: Dysphagia 1 (puree);Thin liquid Liquids provided via: Teaspoon;Cup Medication Administration: Crushed with puree Supervision: Full supervision/cueing for compensatory strategies;Staff to assist with self feeding Compensations: Slow rate;Small sips/bites;Check for pocketing (when pt is accepting of PO) Postural Changes and/or Swallow Maneuvers: Seated upright 90 degrees;Upright 30-60 min after meal              Oral Care Recommendations: Oral care BID Follow up Recommendations: Skilled Nursing facility;24 hour supervision/assistance Plan: Continue with current plan of care    GO     Carmelia Roller., CCC-SLP [phone]  [place] 05/16/2014, 9:32 AM

## 2014-05-16 NOTE — Consult Note (Signed)
Patient [name]      DOB: [birth year]      YQM:[id number]     Consult Note from the Palliative Medicine Team at [facility] Requested by: Dr Eliseo Squires     PCP: Webb Silversmith, NP Reason for Consultation: Clarification of [place] and options     Phone Number:[phone]  Assessment of patients Current state: Continued physical, functional and cognitive decline 2/2 h/o CVA and acute CVA infarction affecting the left basal ganglia , dementia, CKD, A-Fib, overall failure to thrive.  Per family significant  decline over the past three months.  Family faced with advanced directive decisions and anticipatory care needs.  Consult is for review of medical treatment options, clarification of goals of care and end of life issues, disposition and options, and symptom recommendation.  This NP Wadie Lessen reviewed medical records, received report from team, assessed the patient and then meet at the patient's bedside along with his wife, daughter and and son  to discuss diagnosis, prognosis, GOC, EOL wishes disposition and options.  A detailed discussion was had today regarding advanced directives.  Concepts specific to code status, artifical feeding and hydration, continued IV antibiotics and rehospitalization was had.  The difference between a aggressive medical intervention path  and a palliative comfort care path for this patient at this time was had.  Values and goals of care important to patient and family were attempted to be elicited.  Concept of Hospice and Palliative Care were discussed  Natural trajectory and expectations at EOL were discussed.  Questions and concerns addressed.  Hard Choices booklet left for review. Family encouraged to call with questions or concerns.  PMT will continue to support holistically. MOST form completed  Goals of Care: 1.  Code Status:  DNR/DNI-comfort is main focus of care   2. Scope of Treatment:  Vital Signs: daily   Respiratory/Oxygen: for  comfort only  Nutritional Support/Tube Feeds: no artifical feeding or hydration now or in the future  Antibiotics: none  Blood Products:none  BMW:[id number]  Review of Medications to be discontinued:minimize for comfort  Labs: none  Telemetry:none   3. Disposition:  Hopeful for inpatient hospice facility.   4. Symptom Management:    Anxiety/Agitation: Ativan 1 mg po/sl every 4 hrs prn  Pain/Dyspnea: Roxanol 5 mg po/sl every 2 hrs prn   Bowel Regimen:  Dulcolax supp  Dysphagia: diet as tolerated with known risk of aspiration (family aware)  Weakness/failue to thrive  5. Psychosocial:  Emotional support offered to family at bedside.  All verbalize understanding of limited prognosis; hope is for comfort quality and dignity.     Patient Documents Completed or Given: Document Given Completed  Advanced Directives Pkt    MOST  X  DNR    Gone from My Sight    Hard Choices X     Brief HPI:  [name] is an 80 y.o. male who was recently at the hospital on 04/03/2014 and noted to have Multiple areas of restricted diffusion affecting the RIGHT anterior circulation consistent with acute infarction. Patient was discharged to a SNF. Per family he was not moving his left arm and leg well secondary to infarct but could converse well and was moving his right side well. HE was baseline yesterday but nursing staff at SNF noted he was non conversive today and would not follow commands. HE was noted to have new left eye deviation and head is held turned to the left. Currently he not following commands but  responds to voice and painful stimuli.  MRI/MRA 05/14/2014 Acute infarction affecting the left basal ganglia in the region of the putamen, caudate head and anterior limb internal capsule with sparing of the globus pallidus.  Mild swelling but no hemorrhage or mass effect. Extensive old ischemic changes elsewhere throughout the brain. Chronic low-density subdural collections,  unchanged. Focus of care is comfort, overall poor prognosis  ROS: unable to illicit due to decreased mental status    BP 130/70 mmHg  Pulse 87  Temp(Src) 98 F (36.7 C) (Oral)  Resp 18  Ht 6' (1.829 m)  Wt 65.545 kg (144 lb 8 oz)  BMI 19.59 kg/m2  SpO2 99%   PPS:30 % at best   Intake/Output Summary (Last 24 hours) at 05/16/14 1435 Last data filed at 05/16/14 0657  Gross per 24 hour  Intake    483 ml  Output   1150 ml  Net   -667 ml     Physical Exam:  General: ill appearing, minimally responsive, NAD HEENT:  + temporal muscle wasting, dry buccal membranes Chest:   Decreased in bases CVS: RRR Abdomen: soft NT +BS Ext:  Without edema Neuro: garbled speech, unable to follow commands, left hemiparesis  Labs: CBC    Component Value Date/Time   WBC 10.5 05/16/2014 0048   WBC 7.6 06/17/2012 1401   RBC 3.24* 05/16/2014 0048   RBC 3.82* 06/17/2012 1401   HGB 10.3* 05/16/2014 0048   HGB 11.0* 06/17/2012 1401   HCT 31.4* 05/16/2014 0048   HCT 36.1* 06/17/2012 1401   PLT 222 05/16/2014 0048   MCV 96.9 05/16/2014 0048   MCV 94.4 06/17/2012 1401   MCH 31.8 05/16/2014 0048   MCH 28.8 06/17/2012 1401   MCHC 32.8 05/16/2014 0048   MCHC 30.5* 06/17/2012 1401   RDW 14.3 05/16/2014 0048   LYMPHSABS 1.6 05/13/2014 1120   MONOABS 0.3 05/13/2014 1120   EOSABS 0.1 05/13/2014 1120   BASOSABS 0.0 05/13/2014 1120    BMET    Component Value Date/Time   NA 148* 05/16/2014 0048   K 4.7 05/16/2014 0048   CL 115* 05/16/2014 0048   CO2 20 05/16/2014 0048   GLUCOSE 208* 05/16/2014 0048   BUN 41* 05/16/2014 0048   CREATININE 2.17* 05/16/2014 0048   CREATININE 2.08* 06/08/2012 1538   CALCIUM 8.5 05/16/2014 0048   GFRNONAA 26* 05/16/2014 0048   GFRAA 30* 05/16/2014 0048    CMP     Component Value Date/Time   NA 148* 05/16/2014 0048   K 4.7 05/16/2014 0048   CL 115* 05/16/2014 0048   CO2 20 05/16/2014 0048   GLUCOSE 208* 05/16/2014 0048   BUN 41* 05/16/2014 0048    CREATININE 2.17* 05/16/2014 0048   CREATININE 2.08* 06/08/2012 1538   CALCIUM 8.5 05/16/2014 0048   PROT 7.2 05/13/2014 1120   ALBUMIN 3.0* 05/13/2014 1120   AST 18 05/13/2014 1120   ALT 10 05/13/2014 1120   ALKPHOS 68 05/13/2014 1120   BILITOT 0.3 05/13/2014 1120   GFRNONAA 26* 05/16/2014 0048   GFRAA 30* 05/16/2014 0048    Time In Time Out Total Time Spent with Patient Total Overall Time  1400 1520 70 min 80 min    Greater than 50%  of this time was spent counseling and coordinating care related to the above assessment and plan.   Wadie Lessen NP  Palliative Medicine Team Team Phone # [phone] Pager [phone]  Discussed with Dr Eliseo Squires

## 2014-05-16 NOTE — Progress Notes (Signed)
Physical Therapy Treatment Patient Details Name: [name] MRN: [id number] DOB: Dec 17, 1927 Today's Date: 05/16/2014    History of Present Illness Pt is 80 y.o. male who was recently at the hospital on 04/03/2014 due to right CVA.  Pt has the following medical history:   DM II, UTI, CKD 4, Afib, CAD, Dementia.  Recent MRI showing:  Acute infarction affecting the left basal ganglia.  Per family he was not moving his left arm and leg well secondary to infarct but could converse well and was moving his right side well.    PT Comments    Patient continues to require total A for all mobility. Limited by deficits from CVA and generalized deconditioning from previous medical situation. Palliative care is now involved. Family educated that PT will remain involved as able. Per family, patient was not receiving PT any longer at [facility]. Will continue with current POC and await family decision.   Follow Up Recommendations  SNF     Equipment Recommendations       Recommendations for Other Services       Precautions / Restrictions Precautions Precautions: Fall Restrictions Weight Bearing Restrictions: No    Mobility  Bed Mobility Overal bed mobility: Needs Assistance Bed Mobility: Supine to Sit;Sit to Supine     Supine to sit: Total assist;HOB elevated;+2 for physical assistance Sit to supine: Total assist;HOB elevated;+2 for physical assistance   General bed mobility comments: Needs total assist with bed mobility and continues to lean posterior. A to position in R side with pillow to assist with wound prevention  Transfers                 General transfer comment: Deferred due to safety at this time. Pt will require lift.   Ambulation/Gait                 Stairs            Wheelchair Mobility    Modified Rankin (Stroke Patients Only)       Balance     Sitting balance-Leahy Scale: Zero Sitting balance - Comments: Impaired and unable to maintain  balance with posterior lean.                              Cognition Arousal/Alertness: Awake/alert Behavior During Therapy: Restless;Flat affect Overall Cognitive Status: Impaired/Different from baseline Area of Impairment: Following commands;Awareness           Awareness: Intellectual   General Comments: Pt unable to follow commands and non verbal. Pt with right neglect as well as left sided weakness from previous stroke.     Exercises      General Comments        Pertinent Vitals/Pain Pain Assessment: No/denies pain    Home Living                      Prior Function            PT Goals (current goals can now be found in the care plan section) Progress towards PT goals: Not progressing toward goals - comment    Frequency  Min 2X/week    PT Plan Frequency needs to be updated    Co-evaluation             End of Session Equipment Utilized During Treatment: Gait belt Activity Tolerance: Patient limited by lethargy Patient left: in bed;with call bell/phone within reach;with  family/visitor present     Time: 5501-5868 PT Time Calculation (min) (ACUTE ONLY): 34 min  Charges:  $Therapeutic Activity: 23-37 mins                    G Codes:      Jacqualyn Posey 05/16/2014, 2:03 PM  05/16/2014 Jacqualyn Posey PTA [phone] pager [phone] office

## 2014-05-17 ENCOUNTER — Encounter: Payer: Self-pay | Admitting: Neurology

## 2014-05-17 LAB — GLUCOSE, CAPILLARY
Glucose-Capillary: 145 mg/dL — ABNORMAL HIGH (ref 70–99)
Glucose-Capillary: 289 mg/dL — ABNORMAL HIGH (ref 70–99)

## 2014-05-17 MED ORDER — LORAZEPAM 1 MG PO TABS: 1.0000 mg | ORAL_TABLET | ORAL | Status: AC | PRN

## 2014-05-17 MED ORDER — DIVALPROEX SODIUM 125 MG PO CPSP
125.0000 mg | ORAL_CAPSULE | Freq: Two times a day (BID) | ORAL | Status: DC
  Administered 2014-05-17: 125 mg via ORAL
  Filled 2014-05-17: qty 1

## 2014-05-17 MED ORDER — MORPHINE SULFATE (CONCENTRATE) 10 MG/0.5ML PO SOLN: 5.0000 mg | ORAL | Status: AC | PRN

## 2014-05-17 NOTE — Discharge Summary (Signed)
Physician Discharge Summary  [name] KDX:[id number] DOB: 11-25-1927 DOA: 05/13/2014  PCP: Webb Silversmith, NP  Admit date: 05/13/2014 Discharge date: 05/17/2014  Time spent: 35 minutes  Recommendations for Outpatient Follow-up:  To [facility] care   Discharge Diagnoses:  Principal Problem:   CVA (cerebral infarction) Active Problems:   Ischemic heart disease   Diabetes mellitus type 2, insulin dependent   Hypercholesterolemia   CKD (chronic kidney disease)   HTN (hypertension)   SDH (subdural hematoma)   Acute left-sided weakness   UTI (lower urinary tract infection)   DNR (do not resuscitate) discussion   Palliative care encounter   Dysphagia, pharyngoesophageal phase   Weakness generalized   Discharge Condition: terminal  Diet recommendation: DYS 1 diet  Filed Weights   05/14/14 0500 05/15/14 0141 05/16/14 0500  Weight: 63.73 kg (140 lb 8 oz) 65.499 kg (144 lb 6.4 oz) 65.545 kg (144 lb 8 oz)    History of present illness:  [name] is a 80 y.o. male, with history of old subdural hematoma, recent admission and discharge last month for right anterior circulation CVA, type 2 diabetes mellitus, seizures, CAD, right-sided carotid stenosis with 60-79% stenosis found last admission medical treatment per plan last admission, hypertension, dyslipidemia, UTIs who lives in a nursing home has been brought in with chief complaints of aphasia in being less than usually responsive, he does have chronic left-sided weakness but was conversing, exact time of onset unknown, brought to the ER where CT scan of the head was nonacute, he was seen by neurology who thought he probably had progression of his CVA versus change in mental status due to UTI. Aspirin rectal suppository was ordered by neurology and we were requested to admit, he recently had full stroke workup and at this time neurology has recommended not to repeat. If he passes a swallow screen neurology recommends aspirin  and Plavix combination.  Hospital Course:  CVA -MRI: Acute infarction affecting the left basal ganglia in the region of the putamen, caudate head and anterior limb internal capsule with sparing of the globus pallidus. Mild swelling but no hemorrhage or mass effect. Extensive old ischemic changes elsewhere throughout the brain. -History of subdural bleed and recent right anterior circulation ischemic stroke 1 month ago, was placed on Plavix then. -swallow eval SLP 1- at times just pocketing the food -ECHO 10/15 with normal EF and wall motion, Carotid duplex: with R 40-69% stenosis -patient is now comfort focus to hospice house  DM type II.  Comfort focus  Dyslipidemia.  Comfort focus  CKD 4: -comfort focus  Right carotid 60-79% stenosis found last stroke workup a few months ago. At that time it was decided that due to his poor functional status medical treatment only with antiplatelet medications and statin.   P A Fib on EKG.  Comfort focus     Procedures:    Consultations:  Palliative care  neurology  Discharge Exam: Filed Vitals:   05/17/14 0921  BP: 141/67  Pulse: 87  Temp: 98.4 F (36.9 C)  Resp: 16      Discharge Instructions You were cared for by a hospitalist during your hospital stay. If you have any questions about your discharge medications or the care you received while you were in the hospital after you are discharged, you can call the unit and asked to speak with the hospitalist on call if the hospitalist that took care of you is not available. Once you are discharged, your primary care physician will  handle any further medical issues. Please note that NO REFILLS for any discharge medications will be authorized once you are discharged, as it is imperative that you return to your primary care physician (or establish a relationship with a primary care physician if you do not have one) for your aftercare needs so that they can reassess your need for  medications and monitor your lab values.   Current Discharge Medication List    START taking these medications   Details  LORazepam (ATIVAN) 1 MG tablet Take 1 tablet (1 mg total) by mouth every 4 (four) hours as needed for anxiety. Qty: 30 tablet, Refills: 0    Morphine Sulfate (MORPHINE CONCENTRATE) 10 MG/0.5ML SOLN concentrated solution Take 0.25 mLs (5 mg total) by mouth every 2 (two) hours as needed for moderate pain, severe pain or shortness of breath. Qty: 42 mL      CONTINUE these medications which have NOT CHANGED   Details  divalproex (DEPAKOTE SPRINKLE) 125 MG capsule Take 125 mg by mouth 2 (two) times daily.      STOP taking these medications     atorvastatin (LIPITOR) 20 MG tablet      clopidogrel (PLAVIX) 75 MG tablet      ENSURE PLUS (ENSURE PLUS) LIQD      glipiZIDE (GLUCOTROL) 10 MG tablet      isosorbide mononitrate (IMDUR) 30 MG 24 hr tablet      Melatonin 3 MG TABS      omeprazole (PRILOSEC) 20 MG capsule      cephALEXin (KEFLEX) 250 MG capsule      glipiZIDE (GLUCOTROL) 2.5 mg TABS tablet      metroNIDAZOLE (FLAGYL) 500 MG tablet      nystatin (MYCOSTATIN) 100000 UNIT/ML suspension        No Known Allergies    The results of significant diagnostics from this hospitalization (including imaging, microbiology, ancillary and laboratory) are listed below for reference.    Significant Diagnostic Studies: Ct Head Wo Contrast  05/13/2014   CLINICAL DATA:  Stroke-like symptoms, altered mental status, fixed leftward gaze  EXAM: CT HEAD WITHOUT CONTRAST  TECHNIQUE: Contiguous axial images were obtained from the base of the skull through the vertex without intravenous contrast.  COMPARISON:  MRI brain dated 04/02/2014.  CT head dated 04/01/2012.  FINDINGS: Chronic left frontal subdural hygroma, measuring 17 mm in thickness, previously 19 mm. No evidence of parenchymal hemorrhage.  Mild mass effect with underlying sulcal effacement. 2-3 mm rightward  midline shift.  Possible thin right frontal subdural hygroma without mass effect, chronic.  No mass lesion.  No CT evidence of acute infarction.  Extensive small vessel ischemic changes. Old left thalamic lacunar infarct. Old left occipital infarct.  Global cortical and central atrophy.  No ventriculomegaly.  The visualized paranasal sinuses are essentially clear. The mastoid air cells are unopacified.  No evidence of calvarial fracture.  IMPRESSION: No evidence of acute intracranial abnormality.  Chronic left frontal subdural hygroma. Underlying mass effect with 2-3 mm rightward midline shift.  Old left occipital infarct. Old left thalamic infarct. Extensive small vessel ischemic changes.   Electronically Signed   By: Julian Hy M.D.   On: 05/13/2014 11:47   Mr Jodene Nam Head Wo Contrast  05/14/2014   CLINICAL DATA:  Aphasia and decreased responsiveness. Chronic left-sided weakness. Personal history of stroke and subdural collection.  EXAM: MRI HEAD WITHOUT CONTRAST  MRA HEAD WITHOUT CONTRAST  TECHNIQUE: Multiplanar, multiecho pulse sequences of the brain and surrounding structures were obtained  without intravenous contrast. Angiographic images of the head were obtained using MRA technique without contrast.  COMPARISON:  CT 05/13/2014.  MRI 04/02/2014.  FINDINGS: MRI HEAD FINDINGS  Diffusion imaging shows acute infarction affecting the left putamen, caudate and anterior limb internal capsule. Sparing of the globus pallidus. No evidence of hemorrhage or mass effect. No other acute infarction.  Extensive chronic brain atrophy persists with confluent chronic small vessel disease throughout the white matter. Old cortical infarctions in the parieto-occipital in frontal regions appear the same. Chronic low-density subdural collections persist, 1.8 cm thick on the left and 1.1 cm thick on the right. No evidence of obstructive hydrocephalus. No mass lesion. No inflammatory sinus disease.  MRA HEAD FINDINGS  The study  is degraded by motion.  Both internal carotid arteries are patent into the brain. There is detectable flow a in the anterior and middle cerebral arteries bilaterally. Beyond that, detail is not present.  Both vertebral arteries are patent to the basilar. There is flow in the basilar artery and in both posterior cerebral arteries. Again, beyond that, detail is obscured by motion.  IMPRESSION: Acute infarction affecting the left basal ganglia in the region of the putamen, caudate head and anterior limb internal capsule with sparing of the globus pallidus. Mild swelling but no hemorrhage or mass effect.  Extensive old ischemic changes elsewhere throughout the brain.  Chronic low-density subdural collections, unchanged.  Motion degraded MR angiography shows patency of the major vessels but no detail available beyond that.   Electronically Signed   By: Nelson Chimes M.D.   On: 05/14/2014 11:16   [name]  05/14/2014   CLINICAL DATA:  Aphasia and decreased responsiveness. Chronic left-sided weakness. Personal history of stroke and subdural collection.  EXAM: MRI HEAD WITHOUT CONTRAST  MRA HEAD WITHOUT CONTRAST  TECHNIQUE: Multiplanar, multiecho pulse sequences of the brain and surrounding structures were obtained without intravenous contrast. Angiographic images of the head were obtained using MRA technique without contrast.  COMPARISON:  CT 05/13/2014.  MRI 04/02/2014.  FINDINGS: MRI HEAD FINDINGS  Diffusion imaging shows acute infarction affecting the left putamen, caudate and anterior limb internal capsule. Sparing of the globus pallidus. No evidence of hemorrhage or mass effect. No other acute infarction.  Extensive chronic brain atrophy persists with confluent chronic small vessel disease throughout the white matter. Old cortical infarctions in the parieto-occipital in frontal regions appear the same. Chronic low-density subdural collections persist, 1.8 cm thick on the left and 1.1 cm thick on the  right. No evidence of obstructive hydrocephalus. No mass lesion. No inflammatory sinus disease.  MRA HEAD FINDINGS  The study is degraded by motion.  Both internal carotid arteries are patent into the brain. There is detectable flow a in the anterior and middle cerebral arteries bilaterally. Beyond that, detail is not present.  Both vertebral arteries are patent to the basilar. There is flow in the basilar artery and in both posterior cerebral arteries. Again, beyond that, detail is obscured by motion.  IMPRESSION: Acute infarction affecting the left basal ganglia in the region of the putamen, caudate head and anterior limb internal capsule with sparing of the globus pallidus. Mild swelling but no hemorrhage or mass effect.  Extensive old ischemic changes elsewhere throughout the brain.  Chronic low-density subdural collections, unchanged.  Motion degraded MR angiography shows patency of the major vessels but no detail available beyond that.   Electronically Signed   By: Nelson Chimes M.D.   On: 05/14/2014 11:16  Dg Chest Port 1 View  05/13/2014   CLINICAL DATA:  Altered mental status.  Cough for 1 day  EXAM: PORTABLE CHEST - 1 VIEW  COMPARISON:  June 02, 2014 and March 11, 2013  FINDINGS: There is no edema or consolidation. There is a stable 5 mm nodular opacity in the right upper lobe. Heart size and pulmonary vascularity are normal. No adenopathy. No pneumothorax. Patient is status post internal mammary bypass grafting.  IMPRESSION: No edema or consolidation. 5 mm nodular opacity right upper lobe. This finding is stable compared to most recent prior chest radiograph but was not present 1 year prior. In this circumstance, it advisable to obtain noncontrast enhanced chest CT to further assess.   Electronically Signed   By: Lowella Grip M.D.   On: 05/13/2014 16:23    Microbiology: No results found for this or any previous visit (from the past 240 hour(s)).   Labs: Basic Metabolic  Panel:  Recent Labs Lab 05/13/14 1120 05/13/14 1150 05/14/14 0707 05/16/14 0048  NA 148* 147 149* 148*  K 4.8 4.6 4.6 4.7  CL 112 115* 113* 115*  CO2 23  --  19 20  GLUCOSE 101* 103* 122* 208*  BUN 29* 32* 30* 41*  CREATININE 2.05* 2.20* 1.90* 2.17*  CALCIUM 9.3  --  9.0 8.5   Liver Function Tests:  Recent Labs Lab 05/13/14 1120  AST 18  ALT 10  ALKPHOS 68  BILITOT 0.3  PROT 7.2  ALBUMIN 3.0*   No results for input(s): LIPASE, AMYLASE in the last 168 hours. No results for input(s): AMMONIA in the last 168 hours. CBC:  Recent Labs Lab 05/13/14 1120 05/13/14 1150 05/14/14 0707 05/16/14 0048  WBC 7.7  --  7.8 10.5  NEUTROABS 5.7  --   --   --   HGB 10.5* 12.2* 11.1* 10.3*  HCT 32.7* 36.0* 34.3* 31.4*  MCV 95.3  --  98.0 96.9  PLT 289  --  274 222   Cardiac Enzymes: No results for input(s): CKTOTAL, CKMB, CKMBINDEX, TROPONINI in the last 168 hours. BNP: BNP (last 3 results) No results for input(s): PROBNP in the last 8760 hours. CBG:  Recent Labs Lab 05/16/14 1130 05/16/14 1704 05/16/14 2158 05/17/14 0631 05/17/14 1120  GLUCAP 111* 186* 156* 145* 289*       Signed:  VANN, JESSICA  [facility] 05/17/2014, 12:50 PM

## 2014-05-17 NOTE — Progress Notes (Signed)
[facility] PROGRESS NOTE  Dom Haverland DQQ:[id number] DOB: 1928-06-26 DOA: 05/13/2014 PCP: Webb Silversmith, NP  [name] is a 80 y.o. male, with history of old subdural hematoma, recent admission and discharge last month for right anterior circulation CVA, type 2 diabetes mellitus, seizures, CAD, right-sided carotid stenosis with 60-79% stenosis found last admission medical treatment per plan last admission, hypertension, dyslipidemia, UTIs who lives in a nursing home has been brought in with chief complaints of aphasia in being less than usually responsive, he does have chronic left-sided weakness but was conversing, exact time of onset unknown, brought to the ER where CT scan of the head was nonacute, he was seen by neurology who thought he probably had progression of his CVA versus change in mental status due to UTI. Aspirin rectal suppository was ordered by neurology and we were requested to admit, he recently had full stroke workup and at this time neurology has recommended not to repeat.  Family met with palliative care and patient is now comfort care- dispo pending inpatient hospice eval   Assessment/Plan: CVA -MRI: Acute infarction affecting the left basal ganglia in the region of the putamen, caudate head and anterior limb internal capsule with sparing of the globus pallidus. Mild swelling but no hemorrhage or mass effect. Extensive old ischemic changes elsewhere throughout the brain. -History of subdural bleed and recent right anterior circulation ischemic stroke 1 month ago, was placed on Plavix then. -swallow eval SLP 1- at times just pocketing the food -ECHO 10/15  with normal EF and wall motion, Carotid duplex: with R 40-69% stenosis SNF with palliative vs inpatient hospice vs home with hospice -patient is now comfort focus  DM type II.  Comfort focus  Dyslipidemia.  Comfort focus  CKD 4: -comfort focus  Right carotid 60-79% stenosis found last stroke workup a few  months ago. At that time it was decided that due to his poor functional status medical treatment only with antiplatelet medications and statin.   P A Fib on EKG.  -but very poor baseline, repeat EKG. Would be a poor candidate for anticoagulation due to history of subdural bleed and extremely poor functional status  CAD - stable  Dementia -on depakote sprinkles    DVT Prophylaxis Heparin   Condition Poor prognosis   Code Status: DNR Family Communication: spoke with daughter on phone Disposition Plan: hospice?- palliative care consult for eval today   Consultants:  Neuro  Palliative care  HPI/Subjective: Eating breakfast  Objective: Filed Vitals:   05/16/14 2155  BP: 180/87  Pulse: 89  Temp: 98.1 F (36.7 C)  Resp: 18    Intake/Output Summary (Last 24 hours) at 05/17/14 0826 Last data filed at 05/17/14 1941  Gross per 24 hour  Intake      3 ml  Output    600 ml  Net   -597 ml   Filed Weights   05/14/14 0500 05/15/14 0141 05/16/14 0500  Weight: 63.73 kg (140 lb 8 oz) 65.499 kg (144 lb 6.4 oz) 65.545 kg (144 lb 8 oz)    Exam:   General:  Elderly male, Awake  Cardiovascular: S1S2/Irregular rate and rhythm  Respiratory: CTAB  Abdomen: soft, Nt, BS present  Musculoskeletal: no edema c/c  Neuro:   Does not move Left side, moves R side spontaneously, will not follow commands to assess strength  Data Reviewed: Basic Metabolic Panel:  Recent Labs Lab 05/13/14 1120 05/13/14 1150 05/14/14 0707 05/16/14 0048  NA 148* 147 149* 148*  K 4.8  4.6 4.6 4.7  CL 112 115* 113* 115*  CO2 23  --  19 20  GLUCOSE 101* 103* 122* 208*  BUN 29* 32* 30* 41*  CREATININE 2.05* 2.20* 1.90* 2.17*  CALCIUM 9.3  --  9.0 8.5   Liver Function Tests:  Recent Labs Lab 05/13/14 1120  AST 18  ALT 10  ALKPHOS 68  BILITOT 0.3  PROT 7.2  ALBUMIN 3.0*   No results for input(s): LIPASE, AMYLASE in the last 168 hours. No results for input(s): AMMONIA in the last 168  hours. CBC:  Recent Labs Lab 05/13/14 1120 05/13/14 1150 05/14/14 0707 05/16/14 0048  WBC 7.7  --  7.8 10.5  NEUTROABS 5.7  --   --   --   HGB 10.5* 12.2* 11.1* 10.3*  HCT 32.7* 36.0* 34.3* 31.4*  MCV 95.3  --  98.0 96.9  PLT 289  --  274 222   Cardiac Enzymes: No results for input(s): CKTOTAL, CKMB, CKMBINDEX, TROPONINI in the last 168 hours. BNP (last 3 results) No results for input(s): PROBNP in the last 8760 hours. CBG:  Recent Labs Lab 05/16/14 0639 05/16/14 1130 05/16/14 1704 05/16/14 2158 05/17/14 0631  GLUCAP 170* 111* 186* 156* 145*    No results found for this or any previous visit (from the past 240 hour(s)).   Studies: No results found.  Scheduled Meds: . divalproex  125 mg Oral Q12H  . sodium chloride  3 mL Intravenous Q12H   Continuous Infusions:   Antibiotics Given (last 72 hours)    Date/Time Action Medication Dose Rate   05/14/14 1712 Given   cefTRIAXone (ROCEPHIN) 1 g in dextrose 5 % 50 mL IVPB - Premix 1 g 100 mL/hr   05/15/14 1656 Given   cefTRIAXone (ROCEPHIN) 1 g in dextrose 5 % 50 mL IVPB - Premix 1 g 100 mL/hr      Principal Problem:   CVA (cerebral infarction) Active Problems:   Ischemic heart disease   Diabetes mellitus type 2, insulin dependent   Hypercholesterolemia   CKD (chronic kidney disease)   HTN (hypertension)   SDH (subdural hematoma)   Acute left-sided weakness   UTI (lower urinary tract infection)   DNR (do not resuscitate) discussion   Palliative care encounter   Dysphagia, pharyngoesophageal phase   Weakness generalized    Time spent: 23mn    VEliseo SquiresJESSICA  [facility] Pager [phone] 7PM-7AM, please contact night-coverage at [email or web address], password [email or web address]11/17/2015, 8:26 AM  LOS: 4 days

## 2014-05-17 NOTE — Clinical Social Work Psychosocial (Signed)
Clinical Social Work Department BRIEF PSYCHOSOCIAL ASSESSMENT 05/17/2014  Patient:  [name], [name]     Account Number:  [id number]     Admit date:  05/13/2014  Clinical Social Worker:  Glendon Axe, CLINICAL SOCIAL WORKER  Date/Time:  05/17/2014 10:05 AM  Referred by:  Physician  Date Referred:  05/17/2014 Referred for  Residential hospice placement   Other Referral:   Interview type:  Other - See comment Other interview type:   CSW spoke with pt's daughter, Jeani Hawking via telephone.    PSYCHOSOCIAL DATA Living Status:  FACILITY Admitted from facility:  [facility] Level of care:  [facility] Primary support name:  Ilan Kahrs Primary support relationship to patient:  SPOUSE Degree of support available:   [name], daughter [phone]    CURRENT CONCERNS Current Concerns  Post-Acute Placement   Other Concerns:    SOCIAL WORK ASSESSMENT / PLAN Clinical Social Worker spoke with pt's daughter, Jeani Hawking in reference to post-acute placement for residential hospice. CSW also attempted to contact pt's wife, Sydell Axon and left a message for a returned phone call. CSW explained residential hospice process and reviewed list. Pt's family chooses [facility]. CSW also attempted to [facility] liaison and left a message. Palliative Care involved and will continue to meet with pt's family for further recommendations. Pt's daughter reported pt is a resident at [facility] CSW planning to meet with pt's family upon arrival for visitation. CSW will submit pt's clinicals and fax out. CSW continues to follow pt  and pt's family for support and to facilitate pt's discharge needs.   Assessment/plan status:  Psychosocial Support/Ongoing Assessment of Needs Other assessment/ plan:   Information/referral to community resources:   Reviewed Residential Hospice list via telephone.    PATIENT'S/FAMILY'S RESPONSE TO PLAN OF CARE: Pt lying in bed,  disoriented. Pt's family agreeable to residential hospice placement. Pt's family also reported not wanting pt to return to SNF, [facility], however if pt is not appropriate for inpatient hospice pt's family agreed for pt to return to [facility].     Glendon Axe, MSW, LCSWA [phone] 05/17/2014 10:29 AM

## 2014-05-17 NOTE — Progress Notes (Signed)
[facility] discharged to [facility] this evening. Report called in to facility. Patient belongings given to [facility] staff. Tomma Rakers RN

## 2014-05-17 NOTE — Progress Notes (Signed)
Progress Note from the Palliative Medicine Team at [facility]:  -patient is minimally responsive, unable to communicate needs or follow commands -continued physical, functional and cognitive decline   Objective: No Known Allergies Scheduled Meds: . divalproex  125 mg Oral Q12H  . sodium chloride  3 mL Intravenous Q12H   Continuous Infusions:  PRN Meds:.acetaminophen **OR** acetaminophen, bisacodyl, bisacodyl, LORazepam, morphine CONCENTRATE, ondansetron **OR** ondansetron (ZOFRAN) IV  BP 180/87 mmHg  Pulse 89  Temp(Src) 98.1 F (36.7 C) (Axillary)  Resp 18  Ht 6' (1.829 m)  Wt 65.545 kg (144 lb 8 oz)  BMI 19.59 kg/m2  SpO2 100%   PPS:20 %     Intake/Output Summary (Last 24 hours) at 05/17/14 0756 Last data filed at 05/17/14 5038  Gross per 24 hour  Intake      3 ml  Output    600 ml  Net   -597 ml       Physical Exam:  General: ill appearing, minimally responsive, NAD HEENT: + temporal muscle wasting, dry buccal membranes Chest: Decreased in bases CVS: RRR Abdomen: soft NT +BS Ext: Without edema Neuro:  unable to follow commands, left hemiparesis  Labs: CBC    Component Value Date/Time   WBC 10.5 05/16/2014 0048   WBC 7.6 06/17/2012 1401   RBC 3.24* 05/16/2014 0048   RBC 3.82* 06/17/2012 1401   HGB 10.3* 05/16/2014 0048   HGB 11.0* 06/17/2012 1401   HCT 31.4* 05/16/2014 0048   HCT 36.1* 06/17/2012 1401   PLT 222 05/16/2014 0048   MCV 96.9 05/16/2014 0048   MCV 94.4 06/17/2012 1401   MCH 31.8 05/16/2014 0048   MCH 28.8 06/17/2012 1401   MCHC 32.8 05/16/2014 0048   MCHC 30.5* 06/17/2012 1401   RDW 14.3 05/16/2014 0048   LYMPHSABS 1.6 05/13/2014 1120   MONOABS 0.3 05/13/2014 1120   EOSABS 0.1 05/13/2014 1120   BASOSABS 0.0 05/13/2014 1120    BMET    Component Value Date/Time   NA 148* 05/16/2014 0048   K 4.7 05/16/2014 0048   CL 115* 05/16/2014 0048   CO2 20 05/16/2014 0048   GLUCOSE 208* 05/16/2014 0048   BUN 41*  05/16/2014 0048   CREATININE 2.17* 05/16/2014 0048   CREATININE 2.08* 06/08/2012 1538   CALCIUM 8.5 05/16/2014 0048   GFRNONAA 26* 05/16/2014 0048   GFRAA 30* 05/16/2014 0048    CMP     Component Value Date/Time   NA 148* 05/16/2014 0048   K 4.7 05/16/2014 0048   CL 115* 05/16/2014 0048   CO2 20 05/16/2014 0048   GLUCOSE 208* 05/16/2014 0048   BUN 41* 05/16/2014 0048   CREATININE 2.17* 05/16/2014 0048   CREATININE 2.08* 06/08/2012 1538   CALCIUM 8.5 05/16/2014 0048   PROT 7.2 05/13/2014 1120   ALBUMIN 3.0* 05/13/2014 1120   AST 18 05/13/2014 1120   ALT 10 05/13/2014 1120   ALKPHOS 68 05/13/2014 1120   BILITOT 0.3 05/13/2014 1120   GFRNONAA 26* 05/16/2014 0048   GFRAA 30* 05/16/2014 0048     Assessment and Plan: 1. Focus of care is full comfort.  Family remains hopeful for transition to in-patient hospice facility.  Will write for choice.  With minimal po intake, full comfort approach I believe his prognosis is likely less than two weeks.  Patient Documents Completed or Given: Document Given Completed  Advanced Directives Pkt    MOST  X  DNR  X  Gone from [facility]  Hard Choices X    Wadie Lessen NP  Palliative Medicine Team Team Phone # [phone] Pager [phone]   1

## 2014-05-17 NOTE — Clinical Social Work Note (Signed)
Clinical Social Worker facilitated patient discharge including contacting patient family and facility to confirm patient discharge plans.  Clinical information faxed to facility and family agreeable with plan.  CSW arranged ambulance transport via [facility] to [facility].  RN to call report prior to discharge.  Clinical Social Worker will sign off for now as social work intervention is no longer needed. Please consult [place] again if new need arises.  Glendon Axe, MSW, LCSWA [phone] 05/17/2014 1:57 PM

## 2014-05-31 DEATH — deceased

## 2016-06-21 IMAGING — MR MR MRA NECK W/O CM
10 of 14 series · 22 of 48 positions shown · non-contrast
Comparison: CT head 04/01/2014. MR head 03/15/2013. MRI and MRA
12/06/2011

CLINICAL DATA: New onset of LEFT-sided weakness. Exact time of
onset is uncertain, but likely greater than 24 hr duration. Chronic
extra-axial fluid collections are reported. Stroke risk factors
include diabetes, hypertension, hyperlipidemia, and history of
previous stroke.



[Series 2: FLAIR · sagittal · 5.0mm · 0.47mm/px · 2 of 23 slices shown (1 of 2)]
[im 1/23]
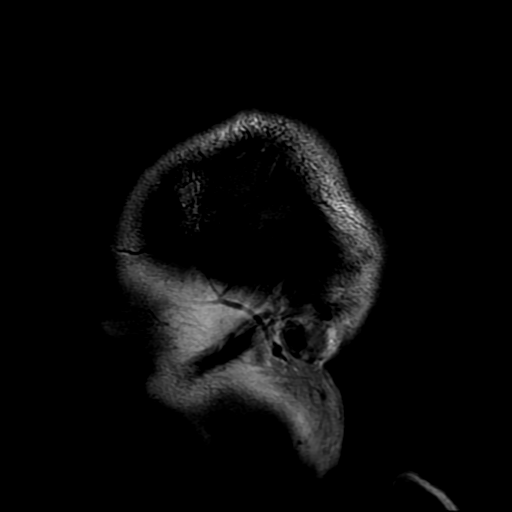
[im 23/23]
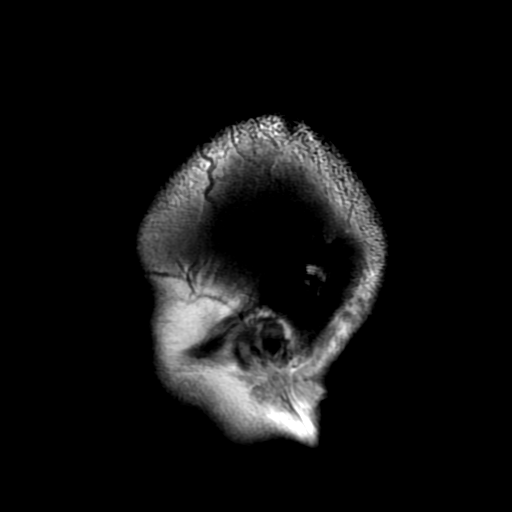

[Series 4: DWI · axial · 5.0mm · 1.02mm/px · z∈[-174,-39]mm · 2 of 64 slices shown (1 of 4)]
[im 1/64]
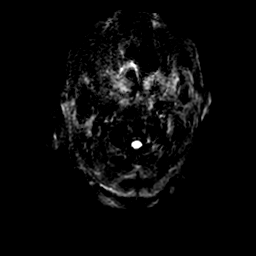
[im 64/64]
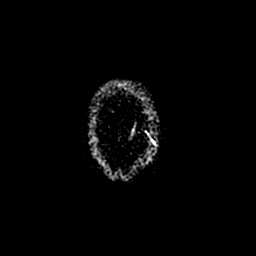

[Series 5: ax (id) 2 · axial · 1.4mm · 0.43mm/px · z∈[-178,-89]mm · 6 of 152 slices shown]
[im 1/152]
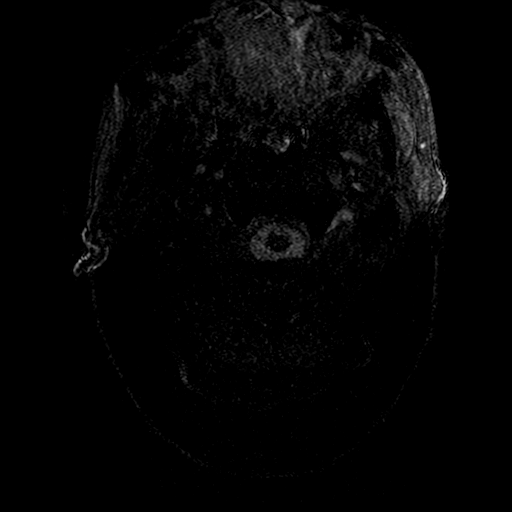
[im 31/152]
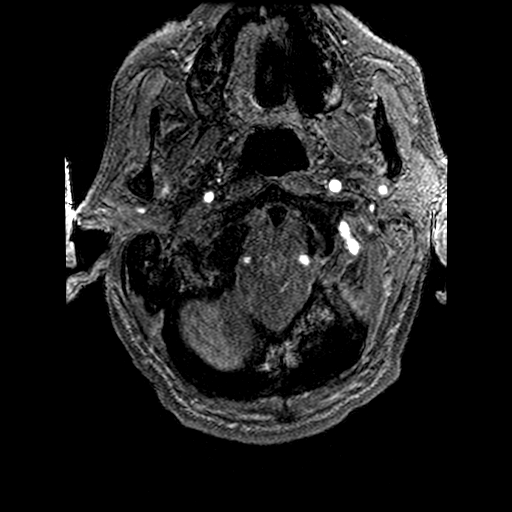
[im 61/152]
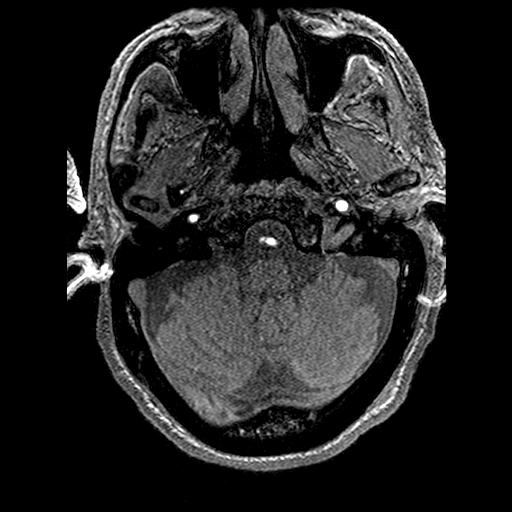
[im 91/152]
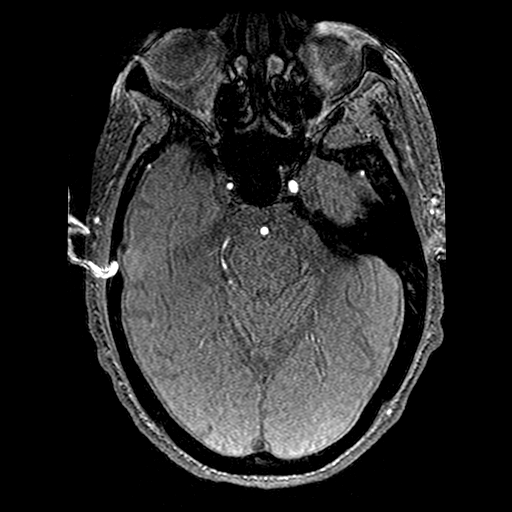
[im 121/152]
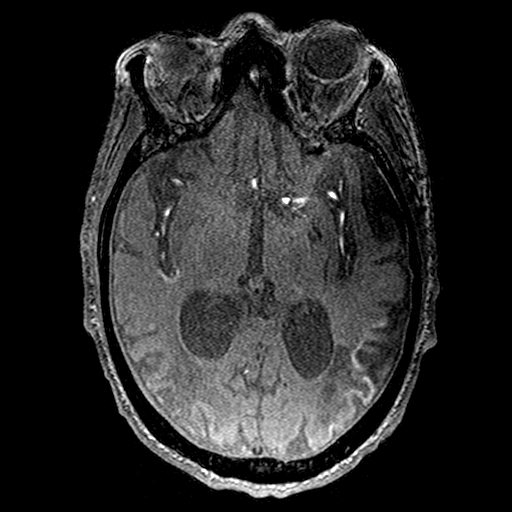
[im 152/152]
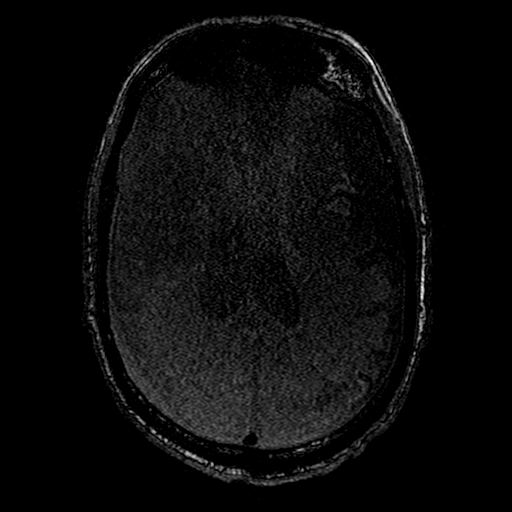

[Series 6: T2 · oblique · 5.0mm · 0.43mm/px · 1 of 28 slices shown (1 of 2)]
[im 1/28]
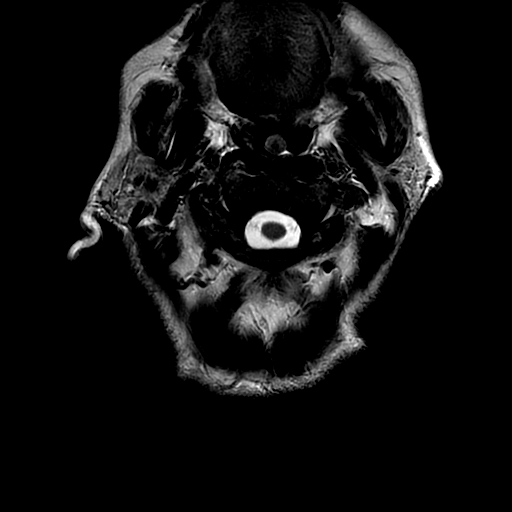

[Series 7: FLAIR · oblique · 5.0mm · 0.43mm/px · 1 of 28 slices shown (2 of 2)]
[im 1/28]
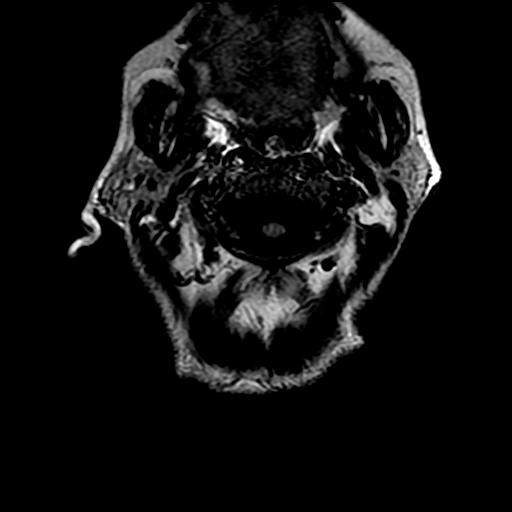

[Series 8: DWI · coronal · 5.0mm · 1.02mm/px · 3 of 78 slices shown (2 of 4)]
[im 1/78]
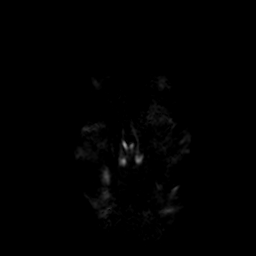
[im 39/78]
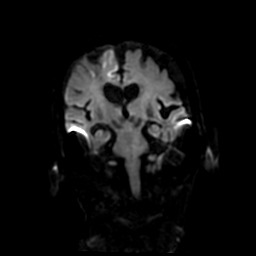
[im 78/78]
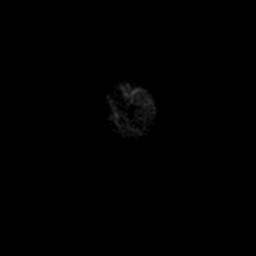

[Series 9: (person_name) · axial · 3.6mm · 0.47mm/px · z∈[-180,-100]mm · 4 of 176 slices shown]
[im 1/176]
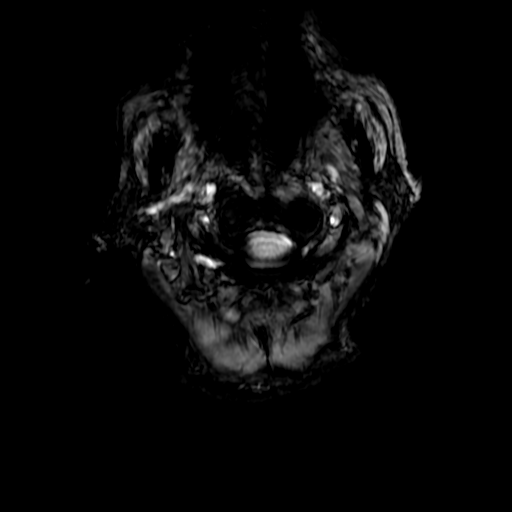
[im 36/176]
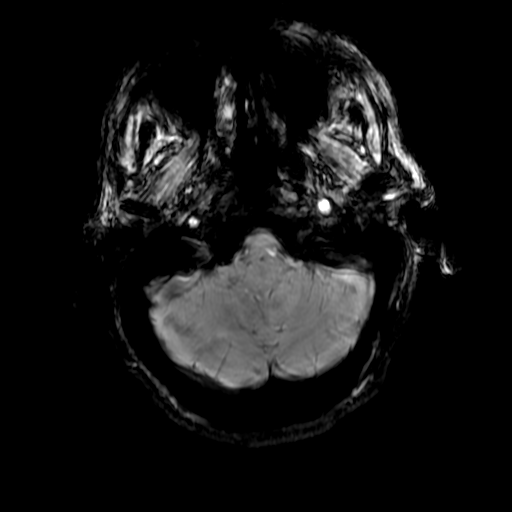
[im 71/176]
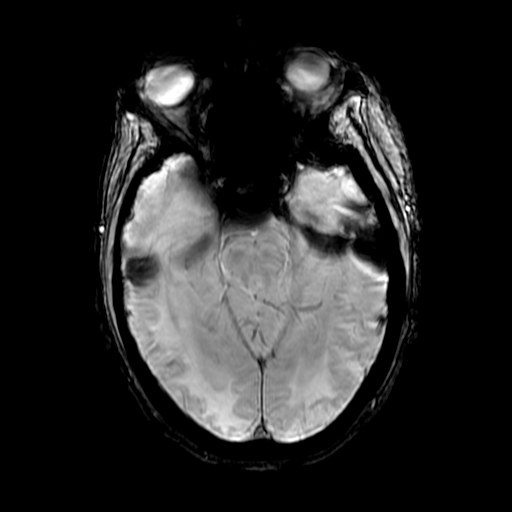
[im 106/176]
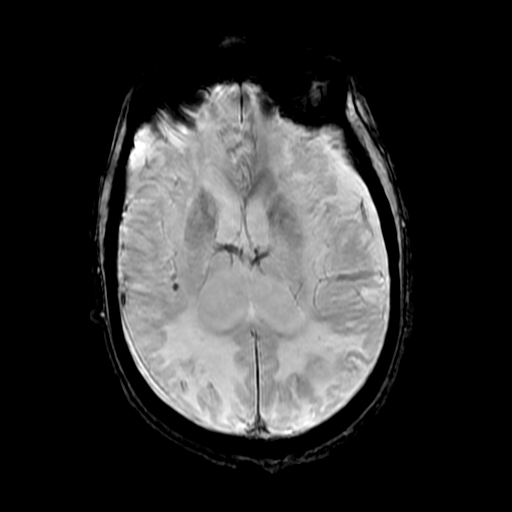

[Series 11: T2 · coronal · 5.0mm · 0.47mm/px · 1 of 35 slices shown (2 of 2)]
[im 1/35]
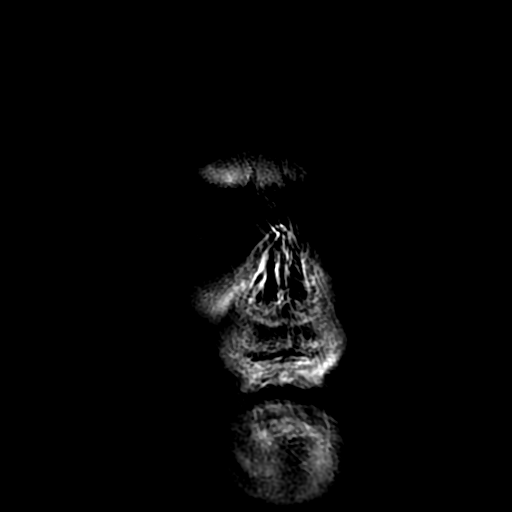

[Series 400: DWI · axial · 5.0mm · 1.02mm/px · 1 of 32 slices shown (3 of 4)]
[im 1/32]
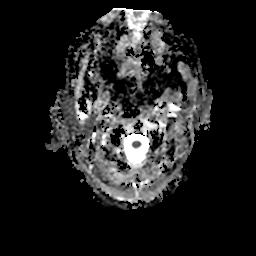

[Series 800: DWI · coronal · 5.0mm · 1.02mm/px · 1 of 39 slices shown (4 of 4)]
[im 1/39]
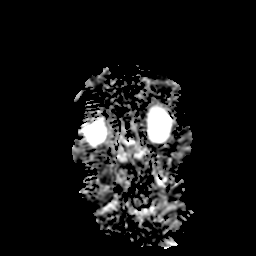

[22 of 48 positions shown; findings below may reference images not displayed]

FINDINGS: MRI HEAD FINDINGS

The patient was unable to remain motionless for the exam. Small or
subtle lesions could be overlooked. Overall exam diagnostic.

Areas of gyriform restricted diffusion affect the RIGHT frontal and
RIGHT posterior frontal cortex in the parasagittal location, the
consistent with acute infarction. Single focus of restricted
diffusion more posteriorly adjacent to the old hemorrhagic infarct.
No other areas of restricted diffusion.

BILATERAL areas of cortical infarction, some which display chronic
hemorrhage appears stable. Chronic of LEFT frontotemporoparietal
subdural hygroma, CSF intensity on all pulse sequences, measures up
to 19 mm, with moderate mass effect on the LEFT hemisphere, slightly
increased in 9976. There is a much smaller RIGHT parietal
collection, more complex, up to 7 mm thick without significant mass
effect, likely representing combination of hygroma and chronic
subdural hematoma, essentially stable from 9976.

Flow voids are maintained. No midline abnormality. No acute sinus or
mastoid disease.

MRA HEAD FINDINGS

Widely patent internal carotid artery on the LEFT. Diminished
caliber of the RIGHT internal carotid artery compared to the LEFT,
in part due to the hypoplastic A1 ACA, but interval change in
appearance compared with previous MRA 12/06/2011. Proximal flow
reducing lesion is discussed in the MRA Neck section. There is
suspected 50% stenosis of the cavernous and supraclinoid ICA on the
RIGHT, non flow reducing. Azygos LEFT ACA supplies both anterior
cerebrals. No MCA stenosis or branch occlusion. Basilar artery
widely patent with LEFT vertebral dominant. Mildly diseased V4
segment distal RIGHT vertebral. No cerebellar branch occlusion. Mild
irregularity both proximal posterior cerebral arteries.

MRA NECK FINDINGS

No priors for comparison. There is suspected flow reducing stenosis
of the RIGHT ICA, estimated 75-90%. Distal right common carotid
artery plaque narrows that vessel without definite ulceration.
Unremarkable appearing LEFT carotid bifurcation. Both vertebrals are
patent, with the LEFT dominant.
IMPRESSION: Multiple areas of restricted diffusion affecting the RIGHT anterior
circulation consistent with acute infarction.

Slight increased size of the LEFT frontotemporoparietal subdural
hygroma, now 19 mm thick. Stable chronic mixed signal intensity
RIGHT hemisphere extra-axial collection.

Diminished caliber of the RIGHT ICA compared to the LEFT,
progressive since 8360, relates to an apparent flow reducing 75-90%
stenosis at the ZANKE origin.
